# Patient Record
Sex: Female | Born: 1940
Health system: Southern US, Community
[De-identification: ages and names within clinical notes are randomized; demographics above are authoritative.]

## PROBLEM LIST (undated history)

## (undated) DIAGNOSIS — F329 Major depressive disorder, single episode, unspecified: Secondary | ICD-10-CM

## (undated) DIAGNOSIS — H269 Unspecified cataract: Secondary | ICD-10-CM

## (undated) DIAGNOSIS — F32A Depression, unspecified: Secondary | ICD-10-CM

## (undated) DIAGNOSIS — K635 Polyp of colon: Secondary | ICD-10-CM

## (undated) DIAGNOSIS — M1990 Unspecified osteoarthritis, unspecified site: Secondary | ICD-10-CM

## (undated) DIAGNOSIS — F419 Anxiety disorder, unspecified: Secondary | ICD-10-CM

## (undated) DIAGNOSIS — T7840XA Allergy, unspecified, initial encounter: Secondary | ICD-10-CM

## (undated) DIAGNOSIS — A159 Respiratory tuberculosis unspecified: Secondary | ICD-10-CM

## (undated) DIAGNOSIS — E785 Hyperlipidemia, unspecified: Secondary | ICD-10-CM

## (undated) HISTORY — PX: BREAST EXCISIONAL BIOPSY: SUR124

## (undated) HISTORY — DX: Depression, unspecified: F32.A

## (undated) HISTORY — PX: EYE SURGERY: SHX253

## (undated) HISTORY — DX: Allergy, unspecified, initial encounter: T78.40XA

## (undated) HISTORY — DX: Unspecified osteoarthritis, unspecified site: M19.90

## (undated) HISTORY — DX: Respiratory tuberculosis unspecified: A15.9

## (undated) HISTORY — PX: BUNIONECTOMY: SHX129

## (undated) HISTORY — DX: Unspecified cataract: H26.9

## (undated) HISTORY — DX: Major depressive disorder, single episode, unspecified: F32.9

## (undated) HISTORY — PX: DILATION AND CURETTAGE OF UTERUS: SHX78

## (undated) HISTORY — DX: Hyperlipidemia, unspecified: E78.5

## (undated) HISTORY — PX: OTHER SURGICAL HISTORY: SHX169

## (undated) HISTORY — DX: Anxiety disorder, unspecified: F41.9

## (undated) HISTORY — PX: HAND CONTRACTURE RELEASE: SHX1724

## (undated) HISTORY — DX: Polyp of colon: K63.5

---

## 1964-11-06 DIAGNOSIS — A159 Respiratory tuberculosis unspecified: Secondary | ICD-10-CM

## 1964-11-06 HISTORY — DX: Respiratory tuberculosis unspecified: A15.9

## 1976-11-06 HISTORY — PX: TUBAL LIGATION: SHX77

## 2000-12-10 ENCOUNTER — Other Ambulatory Visit: Admission: RE | Admit: 2000-12-10 | Discharge: 2000-12-10 | Payer: Self-pay | Admitting: Obstetrics and Gynecology

## 2001-04-25 ENCOUNTER — Encounter: Payer: Self-pay | Admitting: Obstetrics and Gynecology

## 2001-04-25 ENCOUNTER — Encounter: Admission: RE | Admit: 2001-04-25 | Discharge: 2001-04-25 | Payer: Self-pay | Admitting: Obstetrics and Gynecology

## 2001-05-01 ENCOUNTER — Encounter: Payer: Self-pay | Admitting: Obstetrics and Gynecology

## 2001-05-01 ENCOUNTER — Encounter: Admission: RE | Admit: 2001-05-01 | Discharge: 2001-05-01 | Payer: Self-pay | Admitting: Internal Medicine

## 2001-08-12 ENCOUNTER — Encounter: Admission: RE | Admit: 2001-08-12 | Discharge: 2001-08-12 | Payer: Self-pay | Admitting: Family Medicine

## 2001-08-12 ENCOUNTER — Encounter: Payer: Self-pay | Admitting: Family Medicine

## 2002-04-28 ENCOUNTER — Encounter: Payer: Self-pay | Admitting: Family Medicine

## 2002-04-28 ENCOUNTER — Encounter: Admission: RE | Admit: 2002-04-28 | Discharge: 2002-04-28 | Payer: Self-pay | Admitting: Family Medicine

## 2002-12-19 ENCOUNTER — Encounter: Payer: Self-pay | Admitting: Family Medicine

## 2002-12-19 ENCOUNTER — Encounter: Admission: RE | Admit: 2002-12-19 | Discharge: 2002-12-19 | Payer: Self-pay | Admitting: Family Medicine

## 2003-04-30 ENCOUNTER — Encounter: Payer: Self-pay | Admitting: Family Medicine

## 2003-04-30 ENCOUNTER — Encounter: Admission: RE | Admit: 2003-04-30 | Discharge: 2003-04-30 | Payer: Self-pay | Admitting: Family Medicine

## 2005-06-07 ENCOUNTER — Encounter: Admission: RE | Admit: 2005-06-07 | Discharge: 2005-06-07 | Payer: Self-pay | Admitting: Family Medicine

## 2005-09-06 ENCOUNTER — Encounter: Admission: RE | Admit: 2005-09-06 | Discharge: 2005-09-06 | Payer: Self-pay | Admitting: Internal Medicine

## 2005-12-26 ENCOUNTER — Ambulatory Visit: Payer: Self-pay | Admitting: Gastroenterology

## 2006-01-04 DIAGNOSIS — K635 Polyp of colon: Secondary | ICD-10-CM

## 2006-01-04 HISTORY — DX: Polyp of colon: K63.5

## 2006-01-11 ENCOUNTER — Ambulatory Visit: Payer: Self-pay | Admitting: Gastroenterology

## 2006-05-02 ENCOUNTER — Encounter: Admission: RE | Admit: 2006-05-02 | Discharge: 2006-05-02 | Payer: Self-pay | Admitting: Internal Medicine

## 2007-08-08 ENCOUNTER — Encounter: Admission: RE | Admit: 2007-08-08 | Discharge: 2007-08-08 | Payer: Self-pay | Admitting: Internal Medicine

## 2007-08-13 ENCOUNTER — Encounter: Admission: RE | Admit: 2007-08-13 | Discharge: 2007-08-13 | Payer: Self-pay | Admitting: Internal Medicine

## 2008-03-02 ENCOUNTER — Encounter: Admission: RE | Admit: 2008-03-02 | Discharge: 2008-03-02 | Payer: Self-pay | Admitting: Internal Medicine

## 2008-08-17 ENCOUNTER — Encounter: Admission: RE | Admit: 2008-08-17 | Discharge: 2008-08-17 | Payer: Self-pay | Admitting: Internal Medicine

## 2009-09-24 ENCOUNTER — Encounter: Admission: RE | Admit: 2009-09-24 | Discharge: 2009-09-24 | Payer: Self-pay | Admitting: Internal Medicine

## 2010-04-01 LAB — IFOBT (OCCULT BLOOD): IFOBT: NEGATIVE

## 2010-11-16 ENCOUNTER — Encounter
Admission: RE | Admit: 2010-11-16 | Discharge: 2010-11-16 | Payer: Self-pay | Source: Home / Self Care | Attending: Internal Medicine | Admitting: Internal Medicine

## 2011-01-18 ENCOUNTER — Encounter: Payer: Self-pay | Admitting: Gastroenterology

## 2011-01-24 NOTE — Letter (Signed)
Summary: Colonoscopy Letter  [facility]  [address] [place], [place] [address]   Phone: [phone]  Fax: [phone]      January 18, 2011 MRN: [id number]   Lisa Oconnell [address] [place], [place]  [address]   Dear [name],   According to your medical record, it is time for you to schedule a Colonoscopy. The American Cancer Society recommends this procedure as a method to detect early colon cancer. Patients with a family history of colon cancer, or a personal history of colon polyps or inflammatory bowel disease are at increased risk.  This letter has been generated based on the recommendations made at the time of your procedure. If you feel that in your particular situation this may no longer apply, please contact our office.  Please call our office at [phone] to schedule this appointment or to update your records at your earliest convenience.  Thank you for cooperating with [place] to provide you with the very best care possible.   Sincerely,  Judie Petit T. Russella Dar, M.D.  [facility] Gastroenterology Division [phone]

## 2011-02-17 ENCOUNTER — Emergency Department (INDEPENDENT_AMBULATORY_CARE_PROVIDER_SITE_OTHER): Payer: Medicare Other

## 2011-02-17 ENCOUNTER — Emergency Department (HOSPITAL_BASED_OUTPATIENT_CLINIC_OR_DEPARTMENT_OTHER)
Admission: EM | Admit: 2011-02-17 | Discharge: 2011-02-18 | Disposition: A | Discharge status: Home / Self Care | Payer: Medicare Other | Attending: Emergency Medicine | Admitting: Emergency Medicine

## 2011-02-17 DIAGNOSIS — R197 Diarrhea, unspecified: Secondary | ICD-10-CM

## 2011-02-17 DIAGNOSIS — R509 Fever, unspecified: Secondary | ICD-10-CM

## 2011-02-17 DIAGNOSIS — R059 Cough, unspecified: Secondary | ICD-10-CM | POA: Insufficient documentation

## 2011-02-17 DIAGNOSIS — R05 Cough: Secondary | ICD-10-CM

## 2011-02-17 DIAGNOSIS — R1110 Vomiting, unspecified: Secondary | ICD-10-CM | POA: Insufficient documentation

## 2011-02-17 DIAGNOSIS — R112 Nausea with vomiting, unspecified: Secondary | ICD-10-CM

## 2011-02-17 DIAGNOSIS — A481 Legionnaires' disease: Secondary | ICD-10-CM | POA: Insufficient documentation

## 2011-02-17 LAB — URINALYSIS, ROUTINE W REFLEX MICROSCOPIC
Bilirubin Urine: NEGATIVE
Glucose, UA: NEGATIVE mg/dL
Ketones, ur: NEGATIVE mg/dL
Specific Gravity, Urine: 1.023 (ref 1.005–1.030)
Urobilinogen, UA: 1 mg/dL (ref 0.0–1.0)
pH: 5.5 (ref 5.0–8.0)

## 2011-02-17 LAB — CBC
HCT: 33.4 % — ABNORMAL LOW (ref 36.0–46.0)
MCH: 30.6 pg (ref 26.0–34.0)
MCHC: 33.8 g/dL (ref 30.0–36.0)
MCV: 90.5 fL (ref 78.0–100.0)
RDW: 13 % (ref 11.5–15.5)
WBC: 8.2 10*3/uL (ref 4.0–10.5)

## 2011-02-17 LAB — DIFFERENTIAL
Basophils Absolute: 0 10*3/uL (ref 0.0–0.1)
Basophils Relative: 0 % (ref 0–1)
Eosinophils Relative: 0 % (ref 0–5)
Monocytes Absolute: 0.4 10*3/uL (ref 0.1–1.0)
Monocytes Relative: 5 % (ref 3–12)
Neutrophils Relative %: 90 % — ABNORMAL HIGH (ref 43–77)

## 2011-02-17 LAB — BASIC METABOLIC PANEL
BUN: 16 mg/dL (ref 6–23)
CO2: 22 mEq/L (ref 19–32)
Chloride: 108 mEq/L (ref 96–112)

## 2011-02-17 LAB — URINE MICROSCOPIC-ADD ON

## 2011-02-19 LAB — URINE CULTURE
Colony Count: NO GROWTH
Culture: NO GROWTH

## 2011-02-24 LAB — CULTURE, BLOOD (ROUTINE X 2): Culture  Setup Time: 201204140551

## 2011-11-07 HISTORY — PX: CATARACT EXTRACTION W/ INTRAOCULAR LENS  IMPLANT, BILATERAL: SHX1307

## 2011-11-14 DIAGNOSIS — H2510 Age-related nuclear cataract, unspecified eye: Secondary | ICD-10-CM | POA: Diagnosis not present

## 2011-11-21 DIAGNOSIS — H269 Unspecified cataract: Secondary | ICD-10-CM | POA: Diagnosis not present

## 2011-11-21 DIAGNOSIS — H2510 Age-related nuclear cataract, unspecified eye: Secondary | ICD-10-CM | POA: Diagnosis not present

## 2011-11-21 DIAGNOSIS — H538 Other visual disturbances: Secondary | ICD-10-CM | POA: Diagnosis not present

## 2012-03-18 ENCOUNTER — Encounter: Payer: Self-pay | Admitting: Gastroenterology

## 2012-04-03 DIAGNOSIS — M949 Disorder of cartilage, unspecified: Secondary | ICD-10-CM | POA: Diagnosis not present

## 2012-04-03 DIAGNOSIS — R82998 Other abnormal findings in urine: Secondary | ICD-10-CM | POA: Diagnosis not present

## 2012-04-03 DIAGNOSIS — M899 Disorder of bone, unspecified: Secondary | ICD-10-CM | POA: Diagnosis not present

## 2012-04-03 DIAGNOSIS — E785 Hyperlipidemia, unspecified: Secondary | ICD-10-CM | POA: Diagnosis not present

## 2012-04-12 ENCOUNTER — Ambulatory Visit (AMBULATORY_SURGERY_CENTER): Payer: Medicare Other | Admitting: *Deleted

## 2012-04-12 VITALS — Ht 62.5 in | Wt 138.0 lb

## 2012-04-12 DIAGNOSIS — Z0000 Encounter for general adult medical examination without abnormal findings: Secondary | ICD-10-CM | POA: Diagnosis not present

## 2012-04-12 DIAGNOSIS — F341 Dysthymic disorder: Secondary | ICD-10-CM | POA: Diagnosis not present

## 2012-04-12 DIAGNOSIS — Z124 Encounter for screening for malignant neoplasm of cervix: Secondary | ICD-10-CM | POA: Diagnosis not present

## 2012-04-12 DIAGNOSIS — M25519 Pain in unspecified shoulder: Secondary | ICD-10-CM | POA: Diagnosis not present

## 2012-04-12 DIAGNOSIS — E785 Hyperlipidemia, unspecified: Secondary | ICD-10-CM | POA: Diagnosis not present

## 2012-04-12 DIAGNOSIS — Z0120 Encounter for dental examination and cleaning without abnormal findings: Secondary | ICD-10-CM | POA: Diagnosis not present

## 2012-04-12 DIAGNOSIS — Z1211 Encounter for screening for malignant neoplasm of colon: Secondary | ICD-10-CM

## 2012-04-12 MED ORDER — PEG-KCL-NACL-NASULF-NA ASC-C 100 G PO SOLR: ORAL | Status: DC

## 2012-04-26 ENCOUNTER — Ambulatory Visit (AMBULATORY_SURGERY_CENTER): Payer: Medicare Other | Admitting: Gastroenterology

## 2012-04-26 ENCOUNTER — Encounter: Payer: Self-pay | Admitting: Gastroenterology

## 2012-04-26 VITALS — BP 131/80 | HR 61 | Temp 97.0°F | Resp 12 | Ht 62.0 in | Wt 138.0 lb

## 2012-04-26 DIAGNOSIS — Z86010 Personal history of colonic polyps: Secondary | ICD-10-CM

## 2012-04-26 DIAGNOSIS — D126 Benign neoplasm of colon, unspecified: Secondary | ICD-10-CM

## 2012-04-26 DIAGNOSIS — Z1211 Encounter for screening for malignant neoplasm of colon: Secondary | ICD-10-CM | POA: Diagnosis not present

## 2012-04-26 DIAGNOSIS — F411 Generalized anxiety disorder: Secondary | ICD-10-CM | POA: Diagnosis not present

## 2012-04-26 MED ORDER — SODIUM CHLORIDE 0.9 % IV SOLN: 500.0000 mL | INTRAVENOUS | Status: DC

## 2012-04-26 NOTE — Op Note (Signed)
[facility] [address] [place], [place]  [address]  COLONOSCOPY PROCEDURE REPORT PATIENT:  Lisa Oconnell, Lisa Oconnell  MR#:  [id number] BIRTHDATE:  [birth year], 70 yrs. old  GENDER:  female ENDOSCOPIST:  Judie Petit T. Russella Dar, MD, [facility]  PROCEDURE DATE:  04/26/2012 PROCEDURE:  Colonoscopy with snare polypectomy ASA CLASS:  Class II INDICATIONS:  1) surveillance and high-risk screening  2) history of pre-cancerous (adenomatous) colon polyps: 01/2006 MEDICATIONS:   MAC sedation, administered by CRNA, propofol (Diprivan) 200 mg IV DESCRIPTION OF PROCEDURE:   After the risks benefits and alternatives of the procedure were thoroughly explained, informed consent was obtained.  Digital rectal exam was performed and revealed no abnormalities.   The LB [id number]-[id number] endoscope was introduced through the anus and advanced to the cecum, which was identified by both the appendix and ileocecal valve, without limitations.  The quality of the prep was excellent, using MoviPrep.  The instrument was then slowly withdrawn as the colon was fully examined. <<PROCEDUREIMAGES>> FINDINGS:  A sessile polyp was found in the cecum. It was 7 mm in size. Polyp was snared without cautery. Retrieval was successful. Two polyps were found in the ascending colon. They were 5 mm in size. Polyps were snared without cautery. Retrieval was successful. Otherwise normal colonoscopy without other polyps, masses, vascular ectasias, or inflammatory changes. Retroflexed views in the rectum revealed internal hemorrhoids, small.  The time to cecum =  5.25  minutes. The scope was then withdrawn (time =  11.25  min) from the patient and the procedure completed.  COMPLICATIONS:  None  ENDOSCOPIC IMPRESSION: 1) 7 mm sessile polyp in the cecum 2) 5 mm Two polyps in the ascending colon 3) Internal hemorrhoids  RECOMMENDATIONS: 1) Await pathology results 2) Repeat Colonoscopy in 5 years.  Venita Lick. Russella Dar, MD, [facility]  CC:  Kari Baars, MD  n. Rosalie DoctorVenita Lick. Heike Pounds at 04/26/2012 12:01 PM  Sherol Dade, [phone]

## 2012-04-26 NOTE — Patient Instructions (Addendum)
Handouts were given on polyps, hemorrhoids and high fiber diet with liberal fluids.  You may resume your prior medications today.  Please call if any questions or concerns.    YOU HAD AN ENDOSCOPIC PROCEDURE TODAY AT THE [facility]: Refer to the procedure report that was given to you for any specific questions about what was found during the examination.  If the procedure report does not answer your questions, please call your gastroenterologist to clarify.  If you requested that your care partner not be given the details of your procedure findings, then the procedure report has been included in a sealed envelope for you to review at your convenience later.  YOU SHOULD EXPECT: Some feelings of bloating in the abdomen. Passage of more gas than usual.  Walking can help get rid of the air that was put into your GI tract during the procedure and reduce the bloating. If you had a lower endoscopy (such as a colonoscopy or flexible sigmoidoscopy) you may notice spotting of blood in your stool or on the toilet paper. If you underwent a bowel prep for your procedure, then you may not have a normal bowel movement for a few days.  DIET: Your first meal following the procedure should be a light meal and then it is ok to progress to your normal diet.  A half-sandwich or bowl of soup is an example of a good first meal.  Heavy or fried foods are harder to digest and may make you feel nauseous or bloated.  Likewise meals heavy in dairy and vegetables can cause extra gas to form and this can also increase the bloating.  Drink plenty of fluids but you should avoid alcoholic beverages for 24 hours.  ACTIVITY: Your care partner should take you home directly after the procedure.  You should plan to take it easy, moving slowly for the rest of the day.  You can resume normal activity the day after the procedure however you should NOT DRIVE or use heavy machinery for 24 hours (because of the sedation medicines used  during the test).    SYMPTOMS TO REPORT IMMEDIATELY: A gastroenterologist can be reached at any hour.  During normal business hours, 8:30 AM to 5:00 PM Monday through Friday, call [phone].  After hours and on weekends, please call the GI answering service at [phone] who will take a message and have the physician on call contact you.   Following lower endoscopy (colonoscopy or flexible sigmoidoscopy):  Excessive amounts of blood in the stool  Significant tenderness or worsening of abdominal pains  Swelling of the abdomen that is new, acute  Fever of 100F or higher    FOLLOW UP: If any biopsies were taken you will be contacted by phone or by letter within the next 1-3 weeks.  Call your gastroenterologist if you have not heard about the biopsies in 3 weeks.  Our staff will call the home number listed on your records the next business day following your procedure to check on you and address any questions or concerns that you may have at that time regarding the information given to you following your procedure. This is a courtesy call and so if there is no answer at the home number and we have not heard from you through the emergency physician on call, we will assume that you have returned to your regular daily activities without incident.  SIGNATURES/CONFIDENTIALITY: You and/or your care partner have signed paperwork which will be entered into your  electronic medical record.  These signatures attest to the fact that that the information above on your After Visit Summary has been reviewed and is understood.  Full responsibility of the confidentiality of this discharge information lies with you and/or your care-partner.

## 2012-04-26 NOTE — Progress Notes (Signed)
No complaints noted in the recovery room. [place]  Patient did not experience any of the following events: a burn prior to discharge; a fall within the facility; wrong site/side/patient/procedure/implant event; or a hospital transfer or hospital admission upon discharge from the facility. (G8907) Patient did not have preoperative order for IV antibiotic SSI prophylaxis. (G8918)  

## 2012-04-29 ENCOUNTER — Telehealth: Payer: Self-pay | Admitting: *Deleted

## 2012-04-29 NOTE — Telephone Encounter (Signed)
  Follow up Call-  Call back number 04/26/2012  Post procedure Call Back phone  # [phone]  Permission to leave phone message Yes     Patient questions:  Do you have a fever, pain , or abdominal swelling? no Pain Score  0 *  Have you tolerated food without any problems? yes  Have you been able to return to your normal activities? yes  Do you have any questions about your discharge instructions: Diet   no Medications  no Follow up visit  no  Do you have questions or concerns about your Care? no  Actions: * If pain score is 4 or above: No action needed, pain <4.

## 2012-04-30 ENCOUNTER — Encounter: Payer: Self-pay | Admitting: Gastroenterology

## 2012-06-17 ENCOUNTER — Other Ambulatory Visit: Payer: Self-pay | Admitting: Internal Medicine

## 2012-06-17 DIAGNOSIS — Z1231 Encounter for screening mammogram for malignant neoplasm of breast: Secondary | ICD-10-CM

## 2012-07-01 ENCOUNTER — Ambulatory Visit
Admission: RE | Admit: 2012-07-01 | Discharge: 2012-07-01 | Disposition: A | Discharge status: Home / Self Care | Payer: Medicare Other | Source: Ambulatory Visit | Attending: Internal Medicine | Admitting: Internal Medicine

## 2012-07-01 DIAGNOSIS — Z1231 Encounter for screening mammogram for malignant neoplasm of breast: Secondary | ICD-10-CM

## 2012-07-02 ENCOUNTER — Ambulatory Visit: Payer: Medicare Other

## 2012-11-07 DIAGNOSIS — Z1331 Encounter for screening for depression: Secondary | ICD-10-CM | POA: Diagnosis not present

## 2012-11-07 DIAGNOSIS — M25569 Pain in unspecified knee: Secondary | ICD-10-CM | POA: Diagnosis not present

## 2012-11-07 DIAGNOSIS — M7660 Achilles tendinitis, unspecified leg: Secondary | ICD-10-CM | POA: Diagnosis not present

## 2012-11-13 DIAGNOSIS — H18059 Posterior corneal pigmentations, unspecified eye: Secondary | ICD-10-CM | POA: Diagnosis not present

## 2012-11-13 DIAGNOSIS — Z961 Presence of intraocular lens: Secondary | ICD-10-CM | POA: Diagnosis not present

## 2012-11-13 DIAGNOSIS — H524 Presbyopia: Secondary | ICD-10-CM | POA: Diagnosis not present

## 2012-11-13 DIAGNOSIS — H04129 Dry eye syndrome of unspecified lacrimal gland: Secondary | ICD-10-CM | POA: Diagnosis not present

## 2013-02-20 DIAGNOSIS — D239 Other benign neoplasm of skin, unspecified: Secondary | ICD-10-CM | POA: Diagnosis not present

## 2013-02-20 DIAGNOSIS — Z9189 Other specified personal risk factors, not elsewhere classified: Secondary | ICD-10-CM | POA: Diagnosis not present

## 2013-02-20 DIAGNOSIS — L821 Other seborrheic keratosis: Secondary | ICD-10-CM | POA: Diagnosis not present

## 2013-04-10 DIAGNOSIS — E785 Hyperlipidemia, unspecified: Secondary | ICD-10-CM | POA: Diagnosis not present

## 2013-04-10 DIAGNOSIS — M899 Disorder of bone, unspecified: Secondary | ICD-10-CM | POA: Diagnosis not present

## 2013-04-10 DIAGNOSIS — M949 Disorder of cartilage, unspecified: Secondary | ICD-10-CM | POA: Diagnosis not present

## 2013-04-10 DIAGNOSIS — R82998 Other abnormal findings in urine: Secondary | ICD-10-CM | POA: Diagnosis not present

## 2013-04-22 DIAGNOSIS — G4700 Insomnia, unspecified: Secondary | ICD-10-CM | POA: Diagnosis not present

## 2013-04-22 DIAGNOSIS — R17 Unspecified jaundice: Secondary | ICD-10-CM | POA: Diagnosis not present

## 2013-04-22 DIAGNOSIS — M549 Dorsalgia, unspecified: Secondary | ICD-10-CM | POA: Diagnosis not present

## 2013-04-22 DIAGNOSIS — F341 Dysthymic disorder: Secondary | ICD-10-CM | POA: Diagnosis not present

## 2013-04-22 DIAGNOSIS — Z0000 Encounter for general adult medical examination without abnormal findings: Secondary | ICD-10-CM | POA: Diagnosis not present

## 2013-04-22 DIAGNOSIS — M899 Disorder of bone, unspecified: Secondary | ICD-10-CM | POA: Diagnosis not present

## 2013-04-22 DIAGNOSIS — E785 Hyperlipidemia, unspecified: Secondary | ICD-10-CM | POA: Diagnosis not present

## 2013-04-22 DIAGNOSIS — M25559 Pain in unspecified hip: Secondary | ICD-10-CM | POA: Diagnosis not present

## 2013-04-23 DIAGNOSIS — M899 Disorder of bone, unspecified: Secondary | ICD-10-CM | POA: Diagnosis not present

## 2013-04-23 DIAGNOSIS — M949 Disorder of cartilage, unspecified: Secondary | ICD-10-CM | POA: Diagnosis not present

## 2013-04-30 DIAGNOSIS — Z1212 Encounter for screening for malignant neoplasm of rectum: Secondary | ICD-10-CM | POA: Diagnosis not present

## 2013-04-30 LAB — IFOBT (OCCULT BLOOD): IFOBT: NEGATIVE

## 2014-02-04 DIAGNOSIS — H524 Presbyopia: Secondary | ICD-10-CM | POA: Diagnosis not present

## 2014-02-04 DIAGNOSIS — H04129 Dry eye syndrome of unspecified lacrimal gland: Secondary | ICD-10-CM | POA: Diagnosis not present

## 2014-02-04 DIAGNOSIS — Z961 Presence of intraocular lens: Secondary | ICD-10-CM | POA: Diagnosis not present

## 2014-03-04 DIAGNOSIS — Z9189 Other specified personal risk factors, not elsewhere classified: Secondary | ICD-10-CM | POA: Diagnosis not present

## 2014-03-04 DIAGNOSIS — L570 Actinic keratosis: Secondary | ICD-10-CM | POA: Diagnosis not present

## 2014-03-04 DIAGNOSIS — L821 Other seborrheic keratosis: Secondary | ICD-10-CM | POA: Diagnosis not present

## 2014-03-04 DIAGNOSIS — D239 Other benign neoplasm of skin, unspecified: Secondary | ICD-10-CM | POA: Diagnosis not present

## 2014-04-21 DIAGNOSIS — M949 Disorder of cartilage, unspecified: Secondary | ICD-10-CM | POA: Diagnosis not present

## 2014-04-21 DIAGNOSIS — R82998 Other abnormal findings in urine: Secondary | ICD-10-CM | POA: Diagnosis not present

## 2014-04-21 DIAGNOSIS — M899 Disorder of bone, unspecified: Secondary | ICD-10-CM | POA: Diagnosis not present

## 2014-04-21 DIAGNOSIS — E785 Hyperlipidemia, unspecified: Secondary | ICD-10-CM | POA: Diagnosis not present

## 2014-04-27 DIAGNOSIS — Z0000 Encounter for general adult medical examination without abnormal findings: Secondary | ICD-10-CM | POA: Diagnosis not present

## 2014-04-27 DIAGNOSIS — M899 Disorder of bone, unspecified: Secondary | ICD-10-CM | POA: Diagnosis not present

## 2014-04-27 DIAGNOSIS — E785 Hyperlipidemia, unspecified: Secondary | ICD-10-CM | POA: Diagnosis not present

## 2014-04-27 DIAGNOSIS — G4700 Insomnia, unspecified: Secondary | ICD-10-CM | POA: Diagnosis not present

## 2014-04-27 DIAGNOSIS — M25559 Pain in unspecified hip: Secondary | ICD-10-CM | POA: Diagnosis not present

## 2014-04-27 DIAGNOSIS — Z1331 Encounter for screening for depression: Secondary | ICD-10-CM | POA: Diagnosis not present

## 2014-04-27 DIAGNOSIS — M949 Disorder of cartilage, unspecified: Secondary | ICD-10-CM | POA: Diagnosis not present

## 2014-04-27 DIAGNOSIS — F341 Dysthymic disorder: Secondary | ICD-10-CM | POA: Diagnosis not present

## 2014-04-27 DIAGNOSIS — Z23 Encounter for immunization: Secondary | ICD-10-CM | POA: Diagnosis not present

## 2014-04-27 DIAGNOSIS — Z124 Encounter for screening for malignant neoplasm of cervix: Secondary | ICD-10-CM | POA: Diagnosis not present

## 2014-04-28 ENCOUNTER — Other Ambulatory Visit: Payer: Self-pay | Admitting: Internal Medicine

## 2014-04-28 DIAGNOSIS — Z1231 Encounter for screening mammogram for malignant neoplasm of breast: Secondary | ICD-10-CM

## 2014-05-11 ENCOUNTER — Ambulatory Visit: Payer: Medicare Other

## 2014-05-27 ENCOUNTER — Ambulatory Visit
Admission: RE | Admit: 2014-05-27 | Discharge: 2014-05-27 | Disposition: A | Discharge status: Home / Self Care | Payer: Medicare Other | Source: Ambulatory Visit | Attending: Internal Medicine | Admitting: Internal Medicine

## 2014-05-27 DIAGNOSIS — Z1231 Encounter for screening mammogram for malignant neoplasm of breast: Secondary | ICD-10-CM | POA: Diagnosis not present

## 2014-07-23 ENCOUNTER — Encounter: Payer: Self-pay | Admitting: Gastroenterology

## 2014-10-15 DIAGNOSIS — L309 Dermatitis, unspecified: Secondary | ICD-10-CM | POA: Diagnosis not present

## 2014-10-15 DIAGNOSIS — L821 Other seborrheic keratosis: Secondary | ICD-10-CM | POA: Diagnosis not present

## 2014-10-15 DIAGNOSIS — D489 Neoplasm of uncertain behavior, unspecified: Secondary | ICD-10-CM | POA: Diagnosis not present

## 2014-11-19 DIAGNOSIS — L570 Actinic keratosis: Secondary | ICD-10-CM | POA: Diagnosis not present

## 2014-11-19 DIAGNOSIS — L309 Dermatitis, unspecified: Secondary | ICD-10-CM | POA: Diagnosis not present

## 2015-03-15 DIAGNOSIS — L821 Other seborrheic keratosis: Secondary | ICD-10-CM | POA: Diagnosis not present

## 2015-03-15 DIAGNOSIS — D225 Melanocytic nevi of trunk: Secondary | ICD-10-CM | POA: Diagnosis not present

## 2015-03-15 DIAGNOSIS — D485 Neoplasm of uncertain behavior of skin: Secondary | ICD-10-CM | POA: Diagnosis not present

## 2015-03-15 DIAGNOSIS — L570 Actinic keratosis: Secondary | ICD-10-CM | POA: Diagnosis not present

## 2015-03-30 DIAGNOSIS — M859 Disorder of bone density and structure, unspecified: Secondary | ICD-10-CM | POA: Diagnosis not present

## 2015-04-23 DIAGNOSIS — M859 Disorder of bone density and structure, unspecified: Secondary | ICD-10-CM | POA: Diagnosis not present

## 2015-04-23 DIAGNOSIS — E785 Hyperlipidemia, unspecified: Secondary | ICD-10-CM | POA: Diagnosis not present

## 2015-04-23 DIAGNOSIS — N390 Urinary tract infection, site not specified: Secondary | ICD-10-CM | POA: Diagnosis not present

## 2015-04-23 DIAGNOSIS — Z0000 Encounter for general adult medical examination without abnormal findings: Secondary | ICD-10-CM | POA: Diagnosis not present

## 2015-04-30 ENCOUNTER — Other Ambulatory Visit: Payer: Self-pay | Admitting: Internal Medicine

## 2015-04-30 DIAGNOSIS — E785 Hyperlipidemia, unspecified: Secondary | ICD-10-CM

## 2015-04-30 DIAGNOSIS — F418 Other specified anxiety disorders: Secondary | ICD-10-CM | POA: Diagnosis not present

## 2015-04-30 DIAGNOSIS — Z6824 Body mass index (BMI) 24.0-24.9, adult: Secondary | ICD-10-CM | POA: Diagnosis not present

## 2015-04-30 DIAGNOSIS — Z1212 Encounter for screening for malignant neoplasm of rectum: Secondary | ICD-10-CM | POA: Diagnosis not present

## 2015-04-30 DIAGNOSIS — K5909 Other constipation: Secondary | ICD-10-CM | POA: Diagnosis not present

## 2015-04-30 DIAGNOSIS — E784 Other hyperlipidemia: Secondary | ICD-10-CM | POA: Diagnosis not present

## 2015-04-30 DIAGNOSIS — D126 Benign neoplasm of colon, unspecified: Secondary | ICD-10-CM | POA: Diagnosis not present

## 2015-04-30 DIAGNOSIS — Z0000 Encounter for general adult medical examination without abnormal findings: Secondary | ICD-10-CM | POA: Diagnosis not present

## 2015-04-30 DIAGNOSIS — M859 Disorder of bone density and structure, unspecified: Secondary | ICD-10-CM | POA: Diagnosis not present

## 2015-04-30 DIAGNOSIS — M545 Low back pain: Secondary | ICD-10-CM | POA: Diagnosis not present

## 2015-04-30 DIAGNOSIS — Z1389 Encounter for screening for other disorder: Secondary | ICD-10-CM | POA: Diagnosis not present

## 2015-04-30 DIAGNOSIS — K219 Gastro-esophageal reflux disease without esophagitis: Secondary | ICD-10-CM | POA: Diagnosis not present

## 2015-04-30 LAB — IFOBT (OCCULT BLOOD): IFOBT: NEGATIVE

## 2015-05-06 ENCOUNTER — Ambulatory Visit
Admission: RE | Admit: 2015-05-06 | Discharge: 2015-05-06 | Disposition: A | Discharge status: Home / Self Care | Payer: No Typology Code available for payment source | Source: Ambulatory Visit | Attending: Internal Medicine | Admitting: Internal Medicine

## 2015-05-06 DIAGNOSIS — E785 Hyperlipidemia, unspecified: Secondary | ICD-10-CM

## 2015-07-19 ENCOUNTER — Other Ambulatory Visit: Payer: Self-pay

## 2015-07-19 DIAGNOSIS — Z1231 Encounter for screening mammogram for malignant neoplasm of breast: Secondary | ICD-10-CM

## 2015-07-20 ENCOUNTER — Ambulatory Visit: Payer: No Typology Code available for payment source

## 2015-07-29 ENCOUNTER — Ambulatory Visit
Admission: RE | Admit: 2015-07-29 | Discharge: 2015-07-29 | Disposition: A | Discharge status: Home / Self Care | Payer: Medicare Other | Source: Ambulatory Visit

## 2015-07-29 DIAGNOSIS — Z1231 Encounter for screening mammogram for malignant neoplasm of breast: Secondary | ICD-10-CM

## 2015-09-17 DIAGNOSIS — M1711 Unilateral primary osteoarthritis, right knee: Secondary | ICD-10-CM | POA: Diagnosis not present

## 2015-12-13 DIAGNOSIS — M1711 Unilateral primary osteoarthritis, right knee: Secondary | ICD-10-CM | POA: Diagnosis not present

## 2015-12-13 DIAGNOSIS — S83231A Complex tear of medial meniscus, current injury, right knee, initial encounter: Secondary | ICD-10-CM | POA: Diagnosis not present

## 2015-12-13 DIAGNOSIS — M25561 Pain in right knee: Secondary | ICD-10-CM | POA: Diagnosis not present

## 2016-03-14 DIAGNOSIS — M1711 Unilateral primary osteoarthritis, right knee: Secondary | ICD-10-CM | POA: Diagnosis not present

## 2016-03-20 DIAGNOSIS — D485 Neoplasm of uncertain behavior of skin: Secondary | ICD-10-CM | POA: Diagnosis not present

## 2016-03-20 DIAGNOSIS — Z86018 Personal history of other benign neoplasm: Secondary | ICD-10-CM | POA: Diagnosis not present

## 2016-03-20 DIAGNOSIS — L570 Actinic keratosis: Secondary | ICD-10-CM | POA: Diagnosis not present

## 2016-03-20 DIAGNOSIS — D225 Melanocytic nevi of trunk: Secondary | ICD-10-CM | POA: Diagnosis not present

## 2016-03-20 DIAGNOSIS — L821 Other seborrheic keratosis: Secondary | ICD-10-CM | POA: Diagnosis not present

## 2016-03-27 DIAGNOSIS — M1711 Unilateral primary osteoarthritis, right knee: Secondary | ICD-10-CM | POA: Diagnosis not present

## 2016-03-31 DIAGNOSIS — M1711 Unilateral primary osteoarthritis, right knee: Secondary | ICD-10-CM | POA: Diagnosis not present

## 2016-03-31 DIAGNOSIS — M23321 Other meniscus derangements, posterior horn of medial meniscus, right knee: Secondary | ICD-10-CM | POA: Diagnosis not present

## 2016-04-07 ENCOUNTER — Ambulatory Visit: Payer: Self-pay | Admitting: Orthopedic Surgery

## 2016-04-07 NOTE — Patient Instructions (Addendum)
CANDRA ALTHEIDE  04/07/2016   Your procedure is scheduled on: 04/11/16  Report to [facility] Main  Entrance take [facility]  elevators to 3rd floor to  [place] at 1:05 PM.  Call this number if you have problems the morning of surgery [phone]   Remember: ONLY 1 PERSON MAY GO WITH YOU TO SHORT STAY TO GET  READY MORNING OF [place].  Do not eat food or drink liquids :After Midnight.  You may have clear liquids until 9:05 AM the morning of surgery.   Take these medicines the morning of surgery with A SIP OF WATER: Prozac DO NOT TAKE ANY DIABETIC MEDICATIONS DAY OF YOUR SURGERY                               You may not have any metal on your body including hair pins and              piercings  Do not wear jewelry, make-up, lotions, powders or perfumes, deodorant             Do not wear nail polish.  Do not shave  48 hours prior to surgery.              Men may shave face and neck.   Do not bring valuables to the hospital. [place].  Contacts, dentures or bridgework may not be worn into surgery.  Leave suitcase in the car. After surgery it may be brought to your room.     Patients discharged the day of surgery will not be allowed to drive home.  Name and phone number of your driver:  Special Instructions:coughing and deep breathing exercises, leg exercises              Please read over the following fact sheets you were given: _____________________________________________________________________             [facility] - Preparing for Surgery Before surgery, you can play an important role.  Because skin is not sterile, your skin needs to be as free of germs as possible.  You can reduce the number of germs on your skin by washing with CHG (chlorahexidine gluconate) soap before surgery.  CHG is an antiseptic cleaner which kills germs and bonds with the skin to continue killing germs even after  washing. Please DO NOT use if you have an allergy to CHG or antibacterial soaps.  If your skin becomes reddened/irritated stop using the CHG and inform your nurse when you arrive at Short Stay. Do not shave (including legs and underarms) for at least 48 hours prior to the first CHG shower.  You may shave your face/neck. Please follow these instructions carefully:  1.  Shower with CHG Soap the night before surgery and the  morning of Surgery.  2.  If you choose to wash your hair, wash your hair first as usual with your  normal  shampoo.  3.  After you shampoo, rinse your hair and body thoroughly to remove the  shampoo.                           4.  Use CHG as you would any other  liquid soap.  You can apply chg directly  to the skin and wash                       Gently with a scrungie or clean washcloth.  5.  Apply the CHG Soap to your body ONLY FROM THE NECK DOWN.   Do not use on face/ open                           Wound or open sores. Avoid contact with eyes, ears mouth and genitals (private parts).                       Wash face,  Genitals (private parts) with your normal soap.             6.  Wash thoroughly, paying special attention to the area where your surgery  will be performed.  7.  Thoroughly rinse your body with warm water from the neck down.  8.  DO NOT shower/wash with your normal soap after using and rinsing off  the CHG Soap.                9.  Pat yourself dry with a clean towel.            10.  Wear clean pajamas.            11.  Place clean sheets on your bed the night of your first shower and do not  sleep with pets. Day of Surgery : Do not apply any lotions/deodorants the morning of surgery.  Please wear clean clothes to the hospital/surgery center.  FAILURE TO FOLLOW THESE INSTRUCTIONS MAY RESULT IN THE CANCELLATION OF YOUR SURGERY PATIENT SIGNATURE_________________________________  NURSE  SIGNATURE__________________________________  ________________________________________________________________________   Adam Phenix  An incentive spirometer is a tool that can help keep your lungs clear and active. This tool measures how well you are filling your lungs with each breath. Taking long deep breaths may help reverse or decrease the chance of developing breathing (pulmonary) problems (especially infection) following:  A long period of time when you are unable to move or be active. BEFORE THE PROCEDURE   If the spirometer includes an indicator to show your best effort, your nurse or respiratory therapist will set it to a desired goal.  If possible, sit up straight or lean slightly forward. Try not to slouch.  Hold the incentive spirometer in an upright position. INSTRUCTIONS FOR USE  1. Sit on the edge of your bed if possible, or sit up as far as you can in bed or on a chair. 2. Hold the incentive spirometer in an upright position. 3. Breathe out normally. 4. Place the mouthpiece in your mouth and seal your lips tightly around it. 5. Breathe in slowly and as deeply as possible, raising the piston or the ball toward the top of the column. 6. Hold your breath for 3-5 seconds or for as long as possible. Allow the piston or ball to fall to the bottom of the column. 7. Remove the mouthpiece from your mouth and breathe out normally. 8. Rest for a few seconds and repeat Steps 1 through 7 at least 10 times every 1-2 hours when you are awake. Take your time and take a few normal breaths between deep breaths. 9. The spirometer may include an indicator to show your best effort. Use the indicator as a goal  to work toward during each repetition. 10. After each set of 10 deep breaths, practice coughing to be sure your lungs are clear. If you have an incision (the cut made at the time of surgery), support your incision when coughing by placing a pillow or rolled up towels firmly  against it. Once you are able to get out of bed, walk around indoors and cough well. You may stop using the incentive spirometer when instructed by your caregiver.  RISKS AND COMPLICATIONS  Take your time so you do not get dizzy or light-headed.  If you are in pain, you may need to take or ask for pain medication before doing incentive spirometry. It is harder to take a deep breath if you are having pain. AFTER USE  Rest and breathe slowly and easily.  It can be helpful to keep track of a log of your progress. Your caregiver can provide you with a simple table to help with this. If you are using the spirometer at home, follow these instructions: [place] IF:   You are having difficultly using the spirometer.  You have trouble using the spirometer as often as instructed.  Your pain medication is not giving enough relief while using the spirometer.  You develop fever of 100.5 F (38.1 C) or higher. SEEK IMMEDIATE MEDICAL CARE IF:   You cough up bloody sputum that had not been present before.  You develop fever of 102 F (38.9 C) or greater.  You develop worsening pain at or near the incision site. MAKE SURE YOU:   Understand these instructions.  Will watch your condition.  Will get help right away if you are not doing well or get worse. Document Released: 03/05/2007 Document Revised: 01/15/2012 Document Reviewed: 05/06/2007 ExitCare Patient Information 2014 ExitCare, [place].   ________________________________________________________________________    CLEAR LIQUID DIET   Foods Allowed                                                                     Foods Excluded  Coffee and tea, regular and decaf                             liquids that you cannot  Plain Jell-O in any flavor                                             see through such as: Fruit ices (not with fruit pulp)                                     milk, soups, orange juice  Iced Popsicles                                     All solid food Carbonated beverages, regular and diet  Cranberry, grape and apple juices Sports drinks like Gatorade Lightly seasoned clear broth or consume(fat free) Sugar, honey syrup  Sample Menu Breakfast                                Lunch                                     Supper Cranberry juice                    Beef broth                            Chicken broth Jell-O                                     Grape juice                           Apple juice Coffee or tea                        Jell-O                                      Popsicle                                                Coffee or tea                        Coffee or tea  _____________________________________________________________________

## 2016-04-07 NOTE — Progress Notes (Signed)
Preoperative surgical orders have been place into the Epic hospital system for Lisa Oconnell on 04/07/2016, 9:19 AM  by Mickel Crow for surgery on 04-11-16.  Preop Knee Scope orders including IV Tylenol and IV Decadron as long as there are no contraindications to the above medications. Arlee Muslim, PA-C

## 2016-04-10 ENCOUNTER — Encounter (HOSPITAL_COMMUNITY): Payer: Self-pay

## 2016-04-10 ENCOUNTER — Encounter (HOSPITAL_COMMUNITY)
Admission: RE | Admit: 2016-04-10 | Discharge: 2016-04-10 | Disposition: A | Discharge status: Home / Self Care | Payer: Medicare Other | Source: Ambulatory Visit | Attending: Orthopedic Surgery | Admitting: Orthopedic Surgery

## 2016-04-10 DIAGNOSIS — F419 Anxiety disorder, unspecified: Secondary | ICD-10-CM | POA: Diagnosis not present

## 2016-04-10 DIAGNOSIS — X58XXXA Exposure to other specified factors, initial encounter: Secondary | ICD-10-CM | POA: Diagnosis not present

## 2016-04-10 DIAGNOSIS — M1990 Unspecified osteoarthritis, unspecified site: Secondary | ICD-10-CM | POA: Diagnosis not present

## 2016-04-10 DIAGNOSIS — M25561 Pain in right knee: Secondary | ICD-10-CM | POA: Diagnosis present

## 2016-04-10 DIAGNOSIS — S83241A Other tear of medial meniscus, current injury, right knee, initial encounter: Secondary | ICD-10-CM | POA: Diagnosis not present

## 2016-04-10 DIAGNOSIS — Z79899 Other long term (current) drug therapy: Secondary | ICD-10-CM | POA: Diagnosis not present

## 2016-04-10 DIAGNOSIS — M2241 Chondromalacia patellae, right knee: Secondary | ICD-10-CM | POA: Diagnosis not present

## 2016-04-10 LAB — CBC
HEMATOCRIT: 39.3 % (ref 36.0–46.0)
HEMOGLOBIN: 13.8 g/dL (ref 12.0–15.0)
MCH: 31.7 pg (ref 26.0–34.0)
MCHC: 35.1 g/dL (ref 30.0–36.0)
MCV: 90.1 fL (ref 78.0–100.0)
Platelets: 306 10*3/uL (ref 150–400)
RBC: 4.36 MIL/uL (ref 3.87–5.11)
RDW: 13 % (ref 11.5–15.5)
WBC: 5.8 10*3/uL (ref 4.0–10.5)

## 2016-04-10 LAB — SURGICAL PCR SCREEN
MRSA, PCR: NEGATIVE
STAPHYLOCOCCUS AUREUS: NEGATIVE

## 2016-04-11 ENCOUNTER — Ambulatory Visit (HOSPITAL_COMMUNITY): Payer: Medicare Other | Admitting: Anesthesiology

## 2016-04-11 ENCOUNTER — Ambulatory Visit (HOSPITAL_COMMUNITY)
Admission: RE | Admit: 2016-04-11 | Discharge: 2016-04-11 | Disposition: A | Discharge status: Home / Self Care | Payer: Medicare Other | Source: Ambulatory Visit | Attending: Orthopedic Surgery | Admitting: Orthopedic Surgery

## 2016-04-11 ENCOUNTER — Encounter (HOSPITAL_COMMUNITY)
Admission: RE | Disposition: A | Discharge status: Home / Self Care | Payer: Self-pay | Source: Ambulatory Visit | Attending: Orthopedic Surgery

## 2016-04-11 DIAGNOSIS — M2241 Chondromalacia patellae, right knee: Secondary | ICD-10-CM | POA: Diagnosis not present

## 2016-04-11 DIAGNOSIS — X58XXXA Exposure to other specified factors, initial encounter: Secondary | ICD-10-CM | POA: Insufficient documentation

## 2016-04-11 DIAGNOSIS — M1990 Unspecified osteoarthritis, unspecified site: Secondary | ICD-10-CM | POA: Insufficient documentation

## 2016-04-11 DIAGNOSIS — Z79899 Other long term (current) drug therapy: Secondary | ICD-10-CM | POA: Insufficient documentation

## 2016-04-11 DIAGNOSIS — S83241A Other tear of medial meniscus, current injury, right knee, initial encounter: Secondary | ICD-10-CM | POA: Insufficient documentation

## 2016-04-11 DIAGNOSIS — S83249A Other tear of medial meniscus, current injury, unspecified knee, initial encounter: Secondary | ICD-10-CM | POA: Diagnosis present

## 2016-04-11 DIAGNOSIS — M23321 Other meniscus derangements, posterior horn of medial meniscus, right knee: Secondary | ICD-10-CM | POA: Diagnosis not present

## 2016-04-11 DIAGNOSIS — F419 Anxiety disorder, unspecified: Secondary | ICD-10-CM | POA: Insufficient documentation

## 2016-04-11 HISTORY — PX: KNEE ARTHROSCOPY WITH MEDIAL MENISECTOMY: SHX5651

## 2016-04-11 SURGERY — ARTHROSCOPY, KNEE, WITH MEDIAL MENISCECTOMY
Anesthesia: General | Site: Knee | Laterality: Right

## 2016-04-11 MED ORDER — METHOCARBAMOL 500 MG PO TABS: 500.0000 mg | ORAL_TABLET | Freq: Four times a day (QID) | ORAL | Status: DC

## 2016-04-11 MED ORDER — HYDROCODONE-ACETAMINOPHEN 5-325 MG PO TABS: 1.0000 | ORAL_TABLET | ORAL | Status: DC | PRN

## 2016-04-11 MED ORDER — GLYCOPYRROLATE 0.2 MG/ML IJ SOLN
INTRAMUSCULAR | Status: AC
  Filled 2016-04-11: qty 1

## 2016-04-11 MED ORDER — LACTATED RINGERS IV SOLN
INTRAVENOUS | Status: DC
  Administered 2016-04-11: 15:00:00 via INTRAVENOUS

## 2016-04-11 MED ORDER — PROPOFOL 10 MG/ML IV BOLUS
INTRAVENOUS | Status: DC | PRN
  Administered 2016-04-11: 180 mg via INTRAVENOUS

## 2016-04-11 MED ORDER — CEFAZOLIN SODIUM-DEXTROSE 2-4 GM/100ML-% IV SOLN
INTRAVENOUS | Status: AC
  Filled 2016-04-11: qty 100

## 2016-04-11 MED ORDER — DEXAMETHASONE SODIUM PHOSPHATE 10 MG/ML IJ SOLN
INTRAMUSCULAR | Status: AC
  Filled 2016-04-11: qty 1

## 2016-04-11 MED ORDER — DEXAMETHASONE SODIUM PHOSPHATE 10 MG/ML IJ SOLN
10.0000 mg | Freq: Once | INTRAMUSCULAR | Status: AC
  Administered 2016-04-11: 10 mg via INTRAVENOUS

## 2016-04-11 MED ORDER — GLYCOPYRROLATE 0.2 MG/ML IJ SOLN
INTRAMUSCULAR | Status: DC | PRN
  Administered 2016-04-11: 0.2 mg via INTRAVENOUS

## 2016-04-11 MED ORDER — LACTATED RINGERS IR SOLN
Status: DC | PRN
  Administered 2016-04-11 (×2): 3000 mL

## 2016-04-11 MED ORDER — ACETAMINOPHEN 10 MG/ML IV SOLN
1000.0000 mg | Freq: Once | INTRAVENOUS | Status: AC
  Administered 2016-04-11: 1000 mg via INTRAVENOUS
  Filled 2016-04-11: qty 100

## 2016-04-11 MED ORDER — FENTANYL CITRATE (PF) 100 MCG/2ML IJ SOLN
INTRAMUSCULAR | Status: DC | PRN
  Administered 2016-04-11 (×2): 25 ug via INTRAVENOUS

## 2016-04-11 MED ORDER — ACETAMINOPHEN 10 MG/ML IV SOLN
INTRAVENOUS | Status: AC
  Filled 2016-04-11: qty 100

## 2016-04-11 MED ORDER — POVIDONE-IODINE 10 % EX SWAB: 2.0000 "application " | Freq: Once | CUTANEOUS | Status: DC

## 2016-04-11 MED ORDER — ONDANSETRON HCL 4 MG/2ML IJ SOLN
INTRAMUSCULAR | Status: DC | PRN
  Administered 2016-04-11: 4 mg via INTRAVENOUS

## 2016-04-11 MED ORDER — FENTANYL CITRATE (PF) 100 MCG/2ML IJ SOLN
INTRAMUSCULAR | Status: AC
  Filled 2016-04-11: qty 2

## 2016-04-11 MED ORDER — ONDANSETRON HCL 4 MG/2ML IJ SOLN
INTRAMUSCULAR | Status: AC
  Filled 2016-04-11: qty 2

## 2016-04-11 MED ORDER — FENTANYL CITRATE (PF) 100 MCG/2ML IJ SOLN
25.0000 ug | INTRAMUSCULAR | Status: DC | PRN
  Administered 2016-04-11 (×2): 50 ug via INTRAVENOUS

## 2016-04-11 MED ORDER — CEFAZOLIN SODIUM-DEXTROSE 2-4 GM/100ML-% IV SOLN
2.0000 g | INTRAVENOUS | Status: AC
  Administered 2016-04-11: 2 g via INTRAVENOUS
  Filled 2016-04-11: qty 100

## 2016-04-11 MED ORDER — LIDOCAINE HCL (CARDIAC) 20 MG/ML IV SOLN
INTRAVENOUS | Status: DC | PRN
Start: 2016-04-11 — End: 2016-04-11
  Administered 2016-04-11: 60 mg via INTRATRACHEAL

## 2016-04-11 MED ORDER — BUPIVACAINE-EPINEPHRINE 0.25% -1:200000 IJ SOLN
INTRAMUSCULAR | Status: DC | PRN
Start: 2016-04-11 — End: 2016-04-11
  Administered 2016-04-11: 20 mL

## 2016-04-11 MED ORDER — CHLORHEXIDINE GLUCONATE 4 % EX LIQD: 60.0000 mL | Freq: Once | CUTANEOUS | Status: DC

## 2016-04-11 MED ORDER — BUPIVACAINE-EPINEPHRINE (PF) 0.25% -1:200000 IJ SOLN
INTRAMUSCULAR | Status: AC
  Filled 2016-04-11: qty 30

## 2016-04-11 MED ORDER — LIDOCAINE HCL (CARDIAC) 20 MG/ML IV SOLN
INTRAVENOUS | Status: AC
Start: 2016-04-11 — End: 2016-04-11
  Filled 2016-04-11: qty 5

## 2016-04-11 MED ORDER — PROPOFOL 10 MG/ML IV BOLUS
INTRAVENOUS | Status: AC
  Filled 2016-04-11: qty 20

## 2016-04-11 SURGICAL SUPPLY — 27 items
BANDAGE ACE 6X5 VEL STRL LF (GAUZE/BANDAGES/DRESSINGS) ×2 IMPLANT
BANDAGE ELASTIC 6 VELCRO ST LF (GAUZE/BANDAGES/DRESSINGS) ×1 IMPLANT
BLADE 4.2CUDA (BLADE) ×2 IMPLANT
CUFF TOURN SGL QUICK 34 (TOURNIQUET CUFF) ×2
CUFF TRNQT CYL 34X4X40X1 (TOURNIQUET CUFF) ×1 IMPLANT
DRAPE U-SHAPE 47X51 STRL (DRAPES) ×2 IMPLANT
DRSG EMULSION OIL 3X3 NADH (GAUZE/BANDAGES/DRESSINGS) ×2 IMPLANT
DRSG PAD ABDOMINAL 8X10 ST (GAUZE/BANDAGES/DRESSINGS) ×2 IMPLANT
DURAPREP 26ML APPLICATOR (WOUND CARE) ×2 IMPLANT
GAUZE SPONGE 4X4 12PLY STRL (GAUZE/BANDAGES/DRESSINGS) ×2 IMPLANT
GLOVE BIO SURGEON STRL SZ8 (GLOVE) ×2 IMPLANT
GLOVE BIOGEL PI IND STRL 8 (GLOVE) ×1 IMPLANT
GLOVE BIOGEL PI INDICATOR 8 (GLOVE) ×1
GOWN STRL REUS W/TWL LRG LVL3 (GOWN DISPOSABLE) ×2 IMPLANT
KIT BASIN OR (CUSTOM PROCEDURE TRAY) ×2 IMPLANT
MANIFOLD NEPTUNE II (INSTRUMENTS) ×2 IMPLANT
PACK ARTHROSCOPY WL (CUSTOM PROCEDURE TRAY) ×2 IMPLANT
PACK ICE MAXI GEL EZY WRAP (MISCELLANEOUS) ×6 IMPLANT
PADDING CAST COTTON 6X4 STRL (CAST SUPPLIES) ×3 IMPLANT
POSITIONER SURGICAL ARM (MISCELLANEOUS) ×2 IMPLANT
SET ARTHROSCOPY TUBING (MISCELLANEOUS) ×2
SET ARTHROSCOPY TUBING LN (MISCELLANEOUS) IMPLANT
SUT ETHILON 4 0 PS 2 18 (SUTURE) ×2 IMPLANT
TOWEL OR 17X26 10 PK STRL BLUE (TOWEL DISPOSABLE) ×2 IMPLANT
TUBING ARTHRO INFLOW-ONLY STRL (TUBING) ×1 IMPLANT
WAND HAND CNTRL MULTIVAC 90 (MISCELLANEOUS) ×1 IMPLANT
WRAP KNEE MAXI GEL POST OP (GAUZE/BANDAGES/DRESSINGS) ×3 IMPLANT

## 2016-04-11 NOTE — Interval H&P Note (Signed)
History and Physical Interval Note:  04/11/2016 3:53 PM  Lisa Oconnell  has presented today for surgery, with the diagnosis of right knne medial meniscal tear  The various methods of treatment have been discussed with the patient and family. After consideration of risks, benefits and other options for treatment, the patient has consented to  Procedure(s): KNEE ARTHROSCOPY WITH MEDIAL MENISECTOMY (Right) as a surgical intervention .  The patient's history has been reviewed, patient examined, no change in status, stable for surgery.  I have reviewed the patient's chart and labs.  Questions were answered to the patient's satisfaction.     Gearlean Alf

## 2016-04-11 NOTE — H&P (Signed)
  CC- Lisa Oconnell is a 75 y.o. female who presents with right knee pain.  HPI- . Knee Pain: Patient presents with knee pain involving the  right knee. Onset of the symptoms was several months ago. Inciting event: none known. Current symptoms include giving out, locking, pain located medially and stiffness. Pain is aggravated by lateral movements, pivoting, rising after sitting and walking.  Patient has had no prior knee problems. Evaluation to date: MRI: abnormal medial meniscal tear. Treatment to date: corticosteroid injection which was not very effective.  Past Medical History  Diagnosis Date  . Allergy     year round  . Arthritis   . Anxiety   . Hyperlipidemia   . Tuberculosis 1966    treated  . Colon polyp 01/2006    adenomatous    Past Surgical History  Procedure Laterality Date  . Cataract extraction w/ intraocular lens  implant, bilateral  2013  . Tubal ligation  1978  . Hand contracture release    . Bunionectomy      left  . Liposuction (15 yrs ago      stomach    Prior to Admission medications   Medication Sig Start Date End Date Taking? Authorizing Provider  ALPRAZolam Duanne Moron) 0.5 MG tablet Take 0.5 mg by mouth at bedtime.    Yes Historical Provider, MD  diclofenac sodium (VOLTAREN) 1 % GEL Apply 1 application topically 4 (four) times daily as needed (For pain.).   Yes Historical Provider, MD  FLUoxetine (PROZAC) 20 MG capsule Take 20 mg by mouth daily. 03/02/16  Yes Historical Provider, MD  Polyethyl Glycol-Propyl Glycol (SYSTANE ULTRA OP) Place 1 drop into both eyes at bedtime.   Yes Historical Provider, MD   KNEE EXAM antalgic gait, soft tissue tenderness over medial joint line, no effusion, negative drawer sign, collateral ligaments intact  Physical Examination: General appearance - alert, well appearing, and in no distress Mental status - alert, oriented to person, place, and time Chest - clear to auscultation, no wheezes, rales or rhonchi, symmetric air  entry Heart - normal rate, regular rhythm, normal S1, S2, no murmurs, rubs, clicks or gallops Abdomen - soft, nontender, nondistended, no masses or organomegaly Neurological - alert, oriented, normal speech, no focal findings or movement disorder noted   Asessment/Plan--- Right knee medial meniscal tear- - Plan right knee arthroscopy with meniscal debridement. Procedure risks and potential comps discussed with patient who elects to proceed. Goals are decreased pain and increased function with a high likelihood of achieving both

## 2016-04-11 NOTE — Anesthesia Preprocedure Evaluation (Signed)
Anesthesia Evaluation  Patient identified by MRN, date of birth, ID band Patient awake    Reviewed: Allergy & Precautions, H&P , Patient's Chart, lab work & pertinent test results, reviewed documented beta blocker date and time   Airway Mallampati: II  TM Distance: >3 FB Neck ROM: full    Dental no notable dental hx.    Pulmonary    Pulmonary exam normal breath sounds clear to auscultation       Cardiovascular  Rhythm:regular Rate:Normal     Neuro/Psych    GI/Hepatic   Endo/Other    Renal/GU      Musculoskeletal   Abdominal   Peds  Hematology   Anesthesia Other Findings O/w healthy  Reproductive/Obstetrics                             Anesthesia Physical Anesthesia Plan  ASA: II  Anesthesia Plan:    Post-op Pain Management:    Induction: Intravenous  Airway Management Planned: LMA  Additional Equipment:   Intra-op Plan:   Post-operative Plan:   Informed Consent: I have reviewed the patients History and Physical, chart, labs and discussed the procedure including the risks, benefits and alternatives for the proposed anesthesia with the patient or authorized representative who has indicated his/her understanding and acceptance.   Dental Advisory Given and Dental advisory given  Plan Discussed with: CRNA and Surgeon  Anesthesia Plan Comments: (Discussed GA with LMA, possible sore throat, potential need to switch to ETT, N/V, pulmonary aspiration. Questions answered. )        Anesthesia Quick Evaluation

## 2016-04-11 NOTE — Op Note (Signed)
Preoperative diagnosis-  Right knee medial meniscal tear  Postoperative diagnosis Right- knee medial meniscal tear plus  Medial femoral chondral defect  Procedure- Right knee arthroscopy with medial meniscal debridement and chondroplasty   Surgeon- Dione Plover. Avigdor Dollar, MD  Anesthesia-General  EBL-  Minimal  Complications- None  Condition- PACU - hemodynamically stable.  Brief clinical note- -Lisa Oconnell is a 75 y.o.  female with a several month history of right knee pain and mechanical symptoms. Exam and history suggested medial meniscal tear confirmed by MRI. The patient presents now for arthroscopy and debridement   Procedure in detail -       After successful administration of General anesthetic, a tourmiquet is placed high on the Right  thigh and the Right lower extremity is prepped and draped in the usual sterile fashion. Time out is performed by the surgical team. Standard superomedial and inferolateral portal sites are marked and incisions made with an 11 blade. The inflow cannula is passed through the superomedial portal and camera through the inferolateral portal and inflow is initiated. Arthroscopic visualization proceeds.      The undersurface of the patella and trochlea are visualized and there is mild chondromalacia patella. The medial and lateral gutters are visualized and there are no loose bodies in the gutters but a loose body is encountered in the medial compartment. Flexion and valgus force is applied to the knee and the medial compartment is entered. A spinal needle is passed into the joint through the site marked for the inferomedial portal. A small incision is made and the dilator passed into the joint. The findings for the medial compartment are unstable tear of body and posterior horn of medial meniscus with a 1 x 2 cm chondral lesion medial femoral condyle . The tear is debrided to a stable base with baskets and a shaver and sealed off with the Arthrocare. The shaver is  used to debride the unstable cartilage to a stable cartilaginous base with stable edges. It is probed and found to be stable.    The intercondylar notch is visualized and the ACL appears normal. The lateral compartment is entered and the findings are normal .     The joint is again inspected and there are no other tears, defects or loose bodies identified. The arthroscopic equipment is then removed from the inferior portals which are closed with interrupted 4-0 nylon. 20 ml of .25% Marcaine with epinephrine are injected through the inflow cannula and the cannula is then removed and the portal closed with nylon. The incisions are cleaned and dried and a bulky sterile dressing is applied. The patient is then awakened and transported to recovery in stable condition.   04/11/2016, 4:55 PM

## 2016-04-11 NOTE — Progress Notes (Signed)
Mrs .  Robar stated she  had recent  biopsy sites x2 on left lower leg.. No drainage noted. Site within normal limits.

## 2016-04-11 NOTE — Discharge Instructions (Signed)
° °Dr. Husband Aluisio °[facility]ist °[facility] °[address] °[place], [place] [address] °[phone] ° ° °Arthroscopic Procedure, Knee °An arthroscopic procedure can find what is wrong with your knee. °PROCEDURE °Arthroscopy is a surgical technique that allows your orthopedic surgeon to diagnose and treat your knee injury with accuracy. They will look into your knee through a small instrument. This is almost like a small (pencil sized) telescope. Because arthroscopy affects your knee less than open knee surgery, you can anticipate a more rapid recovery. Taking an active role by following your caregiver's instructions will help with rapid and complete recovery. Use crutches, rest, elevation, ice, and knee exercises as instructed. The length of recovery depends on various factors including type of injury, age, physical condition, medical conditions, and your rehabilitation. °Your knee is the joint between the large bones (femur and tibia) in your leg. Cartilage covers these bone ends which are smooth and slippery and allow your knee to bend and move smoothly. Two menisci, thick, semi-lunar shaped pads of cartilage which form a rim inside the joint, help absorb shock and stabilize your knee. Ligaments bind the bones together and support your knee joint. Muscles move the joint, help support your knee, and take stress off the joint itself. Because of this all programs and physical therapy to rehabilitate an injured or repaired knee require rebuilding and strengthening your muscles. °AFTER THE PROCEDURE °· After the procedure, you will be moved to a recovery area until most of the effects of the medication have worn off. Your caregiver will discuss the test results with you.  °· Only take over-the-counter or prescription medicines for pain, discomfort, or fever as directed by your caregiver.  °SEEK MEDICAL CARE IF:  °· You have increased bleeding from your wounds.  °· You see  redness, swelling, or have increasing pain in your wounds.  °· You have pus coming from your wound.  °· You have an oral temperature above 102° F (38.9° C).  °· You notice a bad smell coming from the wound or dressing.  °· You have severe pain with any motion of your knee.  °SEEK IMMEDIATE MEDICAL CARE IF:  °· You develop a rash.  °· You have difficulty breathing.  °· You have any allergic problems.  °FURTHER INSTRUCTIONS:  °· ICE to the affected knee every three hours for 30 minutes at a time and then as needed for pain and swelling.  Continue to use ice on the knee for pain and swelling from surgery. You may notice swelling that will progress down to the foot and ankle.  This is normal after surgery.  Elevate the leg when you are not up walking on it.   ° °DIET °You may resume your previous home diet once your are discharged from the hospital. ° °DRESSING / WOUND CARE / SHOWERING °You may start showering two days after being discharged home but do not submerge the incisions under water.  °Change dressing 48 hours after the procedure and then cover the small incisions with band aids until your follow up visit. °Change the surgical dressings daily and reapply a dry dressing each time.  ° °ACTIVITY °Walk with your walker as instructed. °Use walker as long as suggested by your caregivers. °Avoid periods of inactivity such as sitting longer than an hour when not asleep. This helps prevent blood clots.  °You may resume a sexual relationship in one month or when given the OK by your doctor.  °You may return to   work once you are cleared by your doctor.  °Do not drive a car for 6 weeks or until released by you surgeon.  °Do not drive while taking narcotics. ° °WEIGHT BEARING AS TOLERATED ° °POSTOPERATIVE CONSTIPATION PROTOCOL °Constipation - defined medically as fewer than three stools per week and severe constipation as less than one stool per week. ° °One of the most common issues patients have following surgery is  constipation.  Even if you have a regular bowel pattern at home, your normal regimen is likely to be disrupted due to multiple reasons following surgery.  Combination of anesthesia, postoperative narcotics, change in appetite and fluid intake all can affect your bowels.  In order to avoid complications following surgery, here are some recommendations in order to help you during your recovery period. ° °Colace (docusate) - Pick up an over-the-counter form of Colace or another stool softener and take twice a day as long as you are requiring postoperative pain medications.  Take with a full glass of water daily.  If you experience loose stools or diarrhea, hold the colace until you stool forms back up.  If your symptoms do not get better within 1 week or if they get worse, check with your doctor. ° °Dulcolax (bisacodyl) - Pick up over-the-counter and take as directed by the product packaging as needed to assist with the movement of your bowels.  Take with a full glass of water.  Use this product as needed if not relieved by Colace only.  ° °MiraLax (polyethylene glycol) - Pick up over-the-counter to have on hand.  MiraLax is a solution that will increase the amount of water in your bowels to assist with bowel movements.  Take as directed and can mix with a glass of water, juice, soda, coffee, or tea.  Take if you go more than two days without a movement. °Do not use MiraLax more than once per day. Call your doctor if you are still constipated or irregular after using this medication for 7 days in a row. ° °If you continue to have problems with postoperative constipation, please contact the office for further assistance and recommendations.  If you experience "the worst abdominal pain ever" or develop nausea or vomiting, please contact the office immediatly for further recommendations for treatment. ° °ITCHING ° If you experience itching with your medications, try taking only a single pain pill, or even half a pain pill  at a time.  You can also use Benadryl over the counter for itching or also to help with sleep.  ° °TED HOSE STOCKINGS °Wear the elastic stockings on both legs for three weeks following surgery during the day but you may remove then at night for sleeping. ° °MEDICATIONS °See your medication summary on the “After Visit Summary” that the nursing staff will review with you prior to discharge.  You may have some home medications which will be placed on hold until you complete the course of blood thinner medication.  It is important for you to complete the blood thinner medication as prescribed by your surgeon.  Continue your approved medications as instructed at time of discharge. °Do not drive while taking narcotics.  ° °PRECAUTIONS °If you experience chest pain or shortness of breath - call 911 immediately for transfer to the hospital emergency department.  °If you develop a fever greater that 101 F, purulent drainage from wound, increased redness or drainage from wound, foul odor from the wound/dressing, or calf pain - CONTACT YOUR SURGEON.   °                                                °  FOLLOW-UP APPOINTMENTS Make sure you keep all of your appointments after your operation with your surgeon and caregivers. You should call the office at [phone]  and make an appointment for approximately one week after the date of your surgery or on the date instructed by your surgeon outlined in the "After Visit Summary".  RANGE OF MOTION AND STRENGTHENING EXERCISES  Rehabilitation of the knee is important following a knee injury or an operation. After just a few days of immobilization, the muscles of the thigh which control the knee become weakened and shrink (atrophy). Knee exercises are designed to build up the tone and strength of the thigh muscles and to improve knee motion. Often times heat used for twenty to thirty minutes before working out will loosen up your tissues and help with improving the range of motion  but do not use heat for the first two weeks following surgery. These exercises can be done on a training (exercise) mat, on the floor, on a table or on a bed. Use what ever works the best and is most comfortable for you Knee exercises include:  QUAD STRENGTHENING EXERCISES Strengthening Quadriceps Sets  Tighten muscles on top of thigh by pushing knees down into floor or table. Hold for 20 seconds. Repeat 10 times. Do 2 sessions per day.     Strengthening Terminal Knee Extension  With knee bent over bolster, straighten knee by tightening muscle on top of thigh. Be sure to keep bottom of knee on bolster. Hold for 20 seconds. Repeat 10 times. Do 2 sessions per day.   Straight Leg with Bent Knee  Lie on back with opposite leg bent. Keep involved knee slightly bent at knee and raise leg 4-6". Hold for 10 seconds. Repeat 20 times per set. Do 2 sets per session. Do 2 sessions per day.     General Anesthesia, Adult, Care After Refer to this sheet in the next few weeks. These instructions provide you with information on caring for yourself after your procedure. Your health care provider may also give you more specific instructions. Your treatment has been planned according to current medical practices, but problems sometimes occur. Call your health care provider if you have any problems or questions after your procedure. WHAT TO EXPECT AFTER THE PROCEDURE After the procedure, it is typical to experience:  Sleepiness.  Nausea and vomiting. HOME CARE INSTRUCTIONS  For the first 24 hours after general anesthesia:  Have a responsible person with you.  Do not drive a car. If you are alone, do not take public transportation.  Do not drink alcohol.  Do not take medicine that has not been prescribed by your health care provider.  Do not sign important papers or make important decisions.  You may resume a normal diet and activities as directed by your health care provider.  Change  bandages (dressings) as directed.  If you have questions or problems that seem related to general anesthesia, call the hospital and ask for the anesthetist or anesthesiologist on call. SEEK MEDICAL CARE IF:  You have nausea and vomiting that continue the day after anesthesia.  You develop a rash. SEEK IMMEDIATE MEDICAL CARE IF:   You have difficulty breathing.  You have chest pain.  You have any allergic problems.   This information is not intended to replace advice given to you by your health care provider. Make sure you discuss any questions you have with your health care provider.   Document Released: 01/29/2001 Document Revised: 11/13/2014 Document Reviewed:  02/21/2012 Elsevier Interactive Patient Education [employer].

## 2016-04-11 NOTE — Anesthesia Postprocedure Evaluation (Signed)
Anesthesia Post Note  Patient: Lisa Oconnell  Procedure(s) Performed: Procedure(s) (LRB): KNEE ARTHROSCOPY WITH MEDIAL MENISECTOMY AND CONDROPLASTY (Right)  Patient location during evaluation: PACU Anesthesia Type: General Level of consciousness: sedated Pain management: satisfactory to patient Vital Signs Assessment: post-procedure vital signs reviewed and stable Respiratory status: spontaneous breathing Cardiovascular status: stable Anesthetic complications: no    Last Vitals:  Filed Vitals:   04/11/16 1815 04/11/16 1824  BP: 131/72 133/80  Pulse: 58 62  Temp: 36.5 C 36.5 C  Resp: 16 12    Last Pain:  Filed Vitals:   04/11/16 1829  PainSc: 0-No pain                 Zyanna Leisinger EDWARD

## 2016-04-11 NOTE — Anesthesia Procedure Notes (Signed)
Procedure Name: LMA Insertion Date/Time: 04/11/2016 4:17 PM Performed by: Dione Booze Pre-anesthesia Checklist: Emergency Drugs available, Suction available, Patient being monitored and Patient identified Patient Re-evaluated:Patient Re-evaluated prior to inductionOxygen Delivery Method: Circle system utilized Preoxygenation: Pre-oxygenation with 100% oxygen Intubation Type: IV induction LMA: LMA inserted LMA Size: 4.0 Number of attempts: 1 Tube secured with: Tape Dental Injury: Teeth and Oropharynx as per pre-operative assessment

## 2016-04-11 NOTE — Transfer of Care (Signed)
Immediate Anesthesia Transfer of Care Note  Patient: Lisa Oconnell  Procedure(s) Performed: Procedure(s): KNEE ARTHROSCOPY WITH MEDIAL MENISECTOMY AND CONDROPLASTY (Right)  Patient Location: PACU  Anesthesia Type:General  Level of Consciousness: awake, alert , oriented and patient cooperative  Airway & Oxygen Therapy: Patient Spontanous Breathing and Patient connected to face mask oxygen  Post-op Assessment: Report given to RN and Post -op Vital signs reviewed and stable  Post vital signs: Reviewed and stable  Last Vitals:  Filed Vitals:   04/11/16 1251  BP: 119/71  Pulse: 61  Temp: 36.7 C  Resp: 16    Last Pain: There were no vitals filed for this visit.    Patients Stated Pain Goal: 4 ([id number])  Complications: No apparent anesthesia complications

## 2016-04-12 ENCOUNTER — Encounter (HOSPITAL_COMMUNITY): Payer: Self-pay | Admitting: Orthopedic Surgery

## 2016-04-18 DIAGNOSIS — Z9889 Other specified postprocedural states: Secondary | ICD-10-CM | POA: Diagnosis not present

## 2016-04-18 DIAGNOSIS — Z4789 Encounter for other orthopedic aftercare: Secondary | ICD-10-CM | POA: Diagnosis not present

## 2016-04-27 DIAGNOSIS — Z79899 Other long term (current) drug therapy: Secondary | ICD-10-CM | POA: Diagnosis not present

## 2016-04-27 DIAGNOSIS — N390 Urinary tract infection, site not specified: Secondary | ICD-10-CM | POA: Diagnosis not present

## 2016-04-27 DIAGNOSIS — E784 Other hyperlipidemia: Secondary | ICD-10-CM | POA: Diagnosis not present

## 2016-04-27 DIAGNOSIS — M859 Disorder of bone density and structure, unspecified: Secondary | ICD-10-CM | POA: Diagnosis not present

## 2016-04-27 DIAGNOSIS — R8290 Unspecified abnormal findings in urine: Secondary | ICD-10-CM | POA: Diagnosis not present

## 2016-05-04 DIAGNOSIS — M545 Low back pain: Secondary | ICD-10-CM | POA: Diagnosis not present

## 2016-05-04 DIAGNOSIS — R3121 Asymptomatic microscopic hematuria: Secondary | ICD-10-CM | POA: Diagnosis not present

## 2016-05-04 DIAGNOSIS — G4709 Other insomnia: Secondary | ICD-10-CM | POA: Diagnosis not present

## 2016-05-04 DIAGNOSIS — F418 Other specified anxiety disorders: Secondary | ICD-10-CM | POA: Diagnosis not present

## 2016-05-04 DIAGNOSIS — M859 Disorder of bone density and structure, unspecified: Secondary | ICD-10-CM | POA: Diagnosis not present

## 2016-05-04 DIAGNOSIS — Z6824 Body mass index (BMI) 24.0-24.9, adult: Secondary | ICD-10-CM | POA: Diagnosis not present

## 2016-05-04 DIAGNOSIS — D126 Benign neoplasm of colon, unspecified: Secondary | ICD-10-CM | POA: Diagnosis not present

## 2016-05-04 DIAGNOSIS — Z0000 Encounter for general adult medical examination without abnormal findings: Secondary | ICD-10-CM | POA: Diagnosis not present

## 2016-05-04 DIAGNOSIS — Z1389 Encounter for screening for other disorder: Secondary | ICD-10-CM | POA: Diagnosis not present

## 2016-05-04 DIAGNOSIS — Z23 Encounter for immunization: Secondary | ICD-10-CM | POA: Diagnosis not present

## 2016-05-04 DIAGNOSIS — E784 Other hyperlipidemia: Secondary | ICD-10-CM | POA: Diagnosis not present

## 2016-05-04 DIAGNOSIS — K5909 Other constipation: Secondary | ICD-10-CM | POA: Diagnosis not present

## 2016-05-05 DIAGNOSIS — Z1212 Encounter for screening for malignant neoplasm of rectum: Secondary | ICD-10-CM | POA: Diagnosis not present

## 2016-05-05 LAB — IFOBT (OCCULT BLOOD): IFOBT: NEGATIVE

## 2016-05-25 DIAGNOSIS — Z4789 Encounter for other orthopedic aftercare: Secondary | ICD-10-CM | POA: Diagnosis not present

## 2016-06-02 DIAGNOSIS — M1711 Unilateral primary osteoarthritis, right knee: Secondary | ICD-10-CM | POA: Diagnosis not present

## 2016-06-02 DIAGNOSIS — M25561 Pain in right knee: Secondary | ICD-10-CM | POA: Diagnosis not present

## 2016-06-02 DIAGNOSIS — Z4789 Encounter for other orthopedic aftercare: Secondary | ICD-10-CM | POA: Diagnosis not present

## 2016-06-21 DIAGNOSIS — L280 Lichen simplex chronicus: Secondary | ICD-10-CM | POA: Diagnosis not present

## 2016-06-21 DIAGNOSIS — L570 Actinic keratosis: Secondary | ICD-10-CM | POA: Diagnosis not present

## 2016-06-21 DIAGNOSIS — D485 Neoplasm of uncertain behavior of skin: Secondary | ICD-10-CM | POA: Diagnosis not present

## 2016-06-22 DIAGNOSIS — Z4789 Encounter for other orthopedic aftercare: Secondary | ICD-10-CM | POA: Diagnosis not present

## 2016-07-20 DIAGNOSIS — M1711 Unilateral primary osteoarthritis, right knee: Secondary | ICD-10-CM | POA: Diagnosis not present

## 2016-08-18 DIAGNOSIS — M1711 Unilateral primary osteoarthritis, right knee: Secondary | ICD-10-CM | POA: Diagnosis not present

## 2016-08-25 DIAGNOSIS — M1711 Unilateral primary osteoarthritis, right knee: Secondary | ICD-10-CM | POA: Diagnosis not present

## 2016-09-01 DIAGNOSIS — M1711 Unilateral primary osteoarthritis, right knee: Secondary | ICD-10-CM | POA: Diagnosis not present

## 2016-10-26 DIAGNOSIS — M1711 Unilateral primary osteoarthritis, right knee: Secondary | ICD-10-CM | POA: Diagnosis not present

## 2016-11-25 DIAGNOSIS — M1711 Unilateral primary osteoarthritis, right knee: Secondary | ICD-10-CM | POA: Diagnosis not present

## 2016-12-02 DIAGNOSIS — M1711 Unilateral primary osteoarthritis, right knee: Secondary | ICD-10-CM | POA: Diagnosis not present

## 2016-12-14 DIAGNOSIS — M1711 Unilateral primary osteoarthritis, right knee: Secondary | ICD-10-CM | POA: Diagnosis not present

## 2016-12-25 ENCOUNTER — Other Ambulatory Visit: Payer: Self-pay | Admitting: Internal Medicine

## 2016-12-25 DIAGNOSIS — Z1231 Encounter for screening mammogram for malignant neoplasm of breast: Secondary | ICD-10-CM

## 2016-12-26 DIAGNOSIS — L570 Actinic keratosis: Secondary | ICD-10-CM | POA: Diagnosis not present

## 2016-12-26 DIAGNOSIS — Q386 Other congenital malformations of mouth: Secondary | ICD-10-CM | POA: Diagnosis not present

## 2016-12-27 ENCOUNTER — Other Ambulatory Visit: Payer: Self-pay | Admitting: Orthopedic Surgery

## 2016-12-27 DIAGNOSIS — M1711 Unilateral primary osteoarthritis, right knee: Secondary | ICD-10-CM

## 2016-12-28 ENCOUNTER — Other Ambulatory Visit: Payer: Self-pay | Admitting: Orthopedic Surgery

## 2016-12-28 DIAGNOSIS — M1711 Unilateral primary osteoarthritis, right knee: Secondary | ICD-10-CM

## 2016-12-29 ENCOUNTER — Ambulatory Visit
Admission: RE | Admit: 2016-12-29 | Discharge: 2016-12-29 | Disposition: A | Discharge status: Home / Self Care | Payer: Medicare Other | Source: Ambulatory Visit | Attending: Orthopedic Surgery | Admitting: Orthopedic Surgery

## 2016-12-29 ENCOUNTER — Other Ambulatory Visit: Payer: Medicare Other

## 2016-12-29 DIAGNOSIS — M1711 Unilateral primary osteoarthritis, right knee: Secondary | ICD-10-CM

## 2016-12-29 DIAGNOSIS — Z01818 Encounter for other preprocedural examination: Secondary | ICD-10-CM | POA: Diagnosis not present

## 2017-01-04 DIAGNOSIS — Z961 Presence of intraocular lens: Secondary | ICD-10-CM | POA: Diagnosis not present

## 2017-01-04 DIAGNOSIS — H1851 Endothelial corneal dystrophy: Secondary | ICD-10-CM | POA: Diagnosis not present

## 2017-01-04 DIAGNOSIS — H5201 Hypermetropia, right eye: Secondary | ICD-10-CM | POA: Diagnosis not present

## 2017-01-04 DIAGNOSIS — H52223 Regular astigmatism, bilateral: Secondary | ICD-10-CM | POA: Diagnosis not present

## 2017-01-22 ENCOUNTER — Ambulatory Visit
Admission: RE | Admit: 2017-01-22 | Discharge: 2017-01-22 | Disposition: A | Discharge status: Home / Self Care | Payer: Medicare Other | Source: Ambulatory Visit | Attending: Internal Medicine | Admitting: Internal Medicine

## 2017-01-22 DIAGNOSIS — Z1231 Encounter for screening mammogram for malignant neoplasm of breast: Secondary | ICD-10-CM | POA: Diagnosis not present

## 2017-02-19 ENCOUNTER — Encounter: Payer: Self-pay | Admitting: Gastroenterology

## 2017-02-28 ENCOUNTER — Other Ambulatory Visit: Payer: Self-pay | Admitting: Orthopedic Surgery

## 2017-02-28 NOTE — Progress Notes (Signed)
Please place orders in EPIC as patient is being scheduled for a pre-op appointment! Thank you! 

## 2017-03-08 ENCOUNTER — Ambulatory Visit: Payer: Self-pay | Admitting: Orthopedic Surgery

## 2017-03-22 ENCOUNTER — Other Ambulatory Visit (HOSPITAL_COMMUNITY): Payer: Self-pay | Admitting: *Deleted

## 2017-03-22 DIAGNOSIS — L859 Epidermal thickening, unspecified: Secondary | ICD-10-CM | POA: Diagnosis not present

## 2017-03-22 DIAGNOSIS — D225 Melanocytic nevi of trunk: Secondary | ICD-10-CM | POA: Diagnosis not present

## 2017-03-22 DIAGNOSIS — Z86018 Personal history of other benign neoplasm: Secondary | ICD-10-CM | POA: Diagnosis not present

## 2017-03-22 DIAGNOSIS — D1801 Hemangioma of skin and subcutaneous tissue: Secondary | ICD-10-CM | POA: Diagnosis not present

## 2017-03-22 DIAGNOSIS — L814 Other melanin hyperpigmentation: Secondary | ICD-10-CM | POA: Diagnosis not present

## 2017-03-22 DIAGNOSIS — L821 Other seborrheic keratosis: Secondary | ICD-10-CM | POA: Diagnosis not present

## 2017-03-22 NOTE — Patient Instructions (Addendum)
Lisa Oconnell  03/22/2017   Your procedure is scheduled on: 03-28-17  Report to [facility] Main  Entrance   Report to admitting at 115 PM   Call this number if you have problems the morning of surgery [phone]   Remember: ONLY 1 PERSON MAY GO WITH YOU TO SHORT STAY TO GET  READY MORNING OF [place].  Do not eat food :After Midnight.MAY HAVE CLEAR LIQUIDS FROM MIDNIGHT UNTIL 945 AM DAY OF SURGERY, THEN NOTHING BY MOUTH AFTER 945 AM DAY OF SURGERY.     Take these medicines the morning of surgery with A SIP OF WATER: FLUOXETINE (PROZAC)              You may not have any metal on your body including hair pins and              piercings  Do not wear jewelry, make-up, lotions, powders or perfumes, deodorant             Do not wear nail polish.  Do not shave  48 hours prior to surgery.     Do not bring valuables to the hospital. [place].  Contacts, dentures or bridgework may not be worn into surgery.  Leave suitcase in the car. After surgery it may be brought to your room.                  Please read over the following fact sheets you were given: _____________________________________________________________________                CLEAR LIQUID DIET   Foods Allowed                                                                     Foods Excluded  Coffee and tea, regular and decaf                             liquids that you cannot  Plain Jell-O in any flavor                                             see through such as: Fruit ices (not with fruit pulp)                                     milk, soups, orange juice  Iced Popsicles                                    All solid food Carbonated beverages, regular and diet  Cranberry, grape and apple juices Sports drinks like Gatorade Lightly seasoned clear broth or consume(fat free) Sugar, honey syrup  Sample  Menu Breakfast                                Lunch                                     Supper Cranberry juice                    Beef broth                            Chicken broth Jell-O                                     Grape juice                           Apple juice Coffee or tea                        Jell-O                                      Popsicle                                                Coffee or tea                        Coffee or tea  _____________________________________________________________________  [facility] - Preparing for Surgery Before surgery, you can play an important role.  Because skin is not sterile, your skin needs to be as free of germs as possible.  You can reduce the number of germs on your skin by washing with CHG (chlorahexidine gluconate) soap before surgery.  CHG is an antiseptic cleaner which kills germs and bonds with the skin to continue killing germs even after washing. Please DO NOT use if you have an allergy to CHG or antibacterial soaps.  If your skin becomes reddened/irritated stop using the CHG and inform your nurse when you arrive at Short Stay. Do not shave (including legs and underarms) for at least 48 hours prior to the first CHG shower.  You may shave your face/neck. Please follow these instructions carefully:  1.  Shower with CHG Soap the night before surgery and the  morning of Surgery.  2.  If you choose to wash your hair, wash your hair first as usual with your  normal  shampoo.  3.  After you shampoo, rinse your hair and body thoroughly to remove the  shampoo.                           4.  Use CHG as you would any other liquid soap.  You can apply chg directly  to the skin and wash  Gently with a scrungie or clean washcloth.  5.  Apply the CHG Soap to your body ONLY FROM THE NECK DOWN.   Do not use on face/ open                           Wound or open sores. Avoid contact with eyes, ears mouth and genitals  (private parts).                       Wash face,  Genitals (private parts) with your normal soap.             6.  Wash thoroughly, paying special attention to the area where your surgery  will be performed.  7.  Thoroughly rinse your body with warm water from the neck down.  8.  DO NOT shower/wash with your normal soap after using and rinsing off  the CHG Soap.                9.  Pat yourself dry with a clean towel.            10.  Wear clean pajamas.            11.  Place clean sheets on your bed the night of your first shower and do not  sleep with pets. Day of Surgery : Do not apply any lotions/deodorants the morning of surgery.  Please wear clean clothes to the hospital/surgery center.  FAILURE TO FOLLOW THESE INSTRUCTIONS MAY RESULT IN THE CANCELLATION OF YOUR SURGERY PATIENT SIGNATURE_________________________________  NURSE SIGNATURE__________________________________  ________________________________________________________________________   Adam Phenix  An incentive spirometer is a tool that can help keep your lungs clear and active. This tool measures how well you are filling your lungs with each breath. Taking long deep breaths may help reverse or decrease the chance of developing breathing (pulmonary) problems (especially infection) following:  A long period of time when you are unable to move or be active. BEFORE THE PROCEDURE   If the spirometer includes an indicator to show your best effort, your nurse or respiratory therapist will set it to a desired goal.  If possible, sit up straight or lean slightly forward. Try not to slouch.  Hold the incentive spirometer in an upright position. INSTRUCTIONS FOR USE  1. Sit on the edge of your bed if possible, or sit up as far as you can in bed or on a chair. 2. Hold the incentive spirometer in an upright position. 3. Breathe out normally. 4. Place the mouthpiece in your mouth and seal your lips tightly around  it. 5. Breathe in slowly and as deeply as possible, raising the piston or the ball toward the top of the column. 6. Hold your breath for 3-5 seconds or for as long as possible. Allow the piston or ball to fall to the bottom of the column. 7. Remove the mouthpiece from your mouth and breathe out normally. 8. Rest for a few seconds and repeat Steps 1 through 7 at least 10 times every 1-2 hours when you are awake. Take your time and take a few normal breaths between deep breaths. 9. The spirometer may include an indicator to show your best effort. Use the indicator as a goal to work toward during each repetition. 10. After each set of 10 deep breaths, practice coughing to be sure your lungs are clear. If you have an incision (the cut made at the time of  surgery), support your incision when coughing by placing a pillow or rolled up towels firmly against it. Once you are able to get out of bed, walk around indoors and cough well. You may stop using the incentive spirometer when instructed by your caregiver.  RISKS AND COMPLICATIONS  Take your time so you do not get dizzy or light-headed.  If you are in pain, you may need to take or ask for pain medication before doing incentive spirometry. It is harder to take a deep breath if you are having pain. AFTER USE  Rest and breathe slowly and easily.  It can be helpful to keep track of a log of your progress. Your caregiver can provide you with a simple table to help with this. If you are using the spirometer at home, follow these instructions: [place] IF:   You are having difficultly using the spirometer.  You have trouble using the spirometer as often as instructed.  Your pain medication is not giving enough relief while using the spirometer.  You develop fever of 100.5 F (38.1 C) or higher. SEEK IMMEDIATE MEDICAL CARE IF:   You cough up bloody sputum that had not been present before.  You develop fever of 102 F (38.9 C) or  greater.  You develop worsening pain at or near the incision site. MAKE SURE YOU:   Understand these instructions.  Will watch your condition.  Will get help right away if you are not doing well or get worse. Document Released: 03/05/2007 Document Revised: 01/15/2012 Document Reviewed: 05/06/2007 Exit[employer] Patient Information 2014 Exit[employer], [place].   ________________________________________________________________________  WHAT IS A BLOOD TRANSFUSION? Blood Transfusion Information  A transfusion is the replacement of blood or some of its parts. Blood is made up of multiple cells which provide different functions.  Red blood cells carry oxygen and are used for blood loss replacement.  White blood cells fight against infection.  Platelets control bleeding.  Plasma helps clot blood.  Other blood products are available for specialized needs, such as hemophilia or other clotting disorders. BEFORE THE TRANSFUSION  Who gives blood for transfusions?   Healthy volunteers who are fully evaluated to make sure their blood is safe. This is blood bank blood. Transfusion therapy is the safest it has ever been in the practice of medicine. Before blood is taken from a donor, a complete history is taken to make sure that person has no history of diseases nor engages in risky social behavior (examples are intravenous drug use or sexual activity with multiple partners). The donor's travel history is screened to minimize risk of transmitting infections, such as malaria. The donated blood is tested for signs of infectious diseases, such as HIV and hepatitis. The blood is then tested to be sure it is compatible with you in order to minimize the chance of a transfusion reaction. If you or a relative donates blood, this is often done in anticipation of surgery and is not appropriate for emergency situations. It takes many days to process the donated blood. RISKS AND COMPLICATIONS Although transfusion therapy  is very safe and saves many lives, the main dangers of transfusion include:   Getting an infectious disease.  Developing a transfusion reaction. This is an allergic reaction to something in the blood you were given. Every precaution is taken to prevent this. The decision to have a blood transfusion has been considered carefully by your caregiver before blood is given. Blood is not given unless the benefits outweigh the risks. AFTER THE  TRANSFUSION  Right after receiving a blood transfusion, you will usually feel much better and more energetic. This is especially true if your red blood cells have gotten low (anemic). The transfusion raises the level of the red blood cells which carry oxygen, and this usually causes an energy increase.  The nurse administering the transfusion will monitor you carefully for complications. HOME CARE INSTRUCTIONS  No special instructions are needed after a transfusion. You may find your energy is better. Speak with your caregiver about any limitations on activity for underlying diseases you may have. SEEK MEDICAL CARE IF:   Your condition is not improving after your transfusion.  You develop redness or irritation at the intravenous (IV) site. SEEK IMMEDIATE MEDICAL CARE IF:  Any of the following symptoms occur over the next 12 hours:  Shaking chills.  You have a temperature by mouth above 102 F (38.9 C), not controlled by medicine.  Chest, back, or muscle pain.  People around you feel you are not acting correctly or are confused.  Shortness of breath or difficulty breathing.  Dizziness and fainting.  You get a rash or develop hives.  You have a decrease in urine output.  Your urine turns a dark color or changes to pink, red, or brown. Any of the following symptoms occur over the next 10 days:  You have a temperature by mouth above 102 F (38.9 C), not controlled by medicine.  Shortness of breath.  Weakness after normal activity.  The white  part of the eye turns yellow (jaundice).  You have a decrease in the amount of urine or are urinating less often.  Your urine turns a dark color or changes to pink, red, or brown. Document Released: 10/20/2000 Document Revised: 01/15/2012 Document Reviewed: 06/08/2008 [facility] Patient Information 2014 [place], [place].  _______________________________________________________________________

## 2017-03-22 NOTE — Progress Notes (Signed)
MEDICAL CLEARANCE NOTE DR Marton Redwood 05-04-16 ON CHART FOR 03-28-16 SURGERY.

## 2017-03-23 ENCOUNTER — Encounter (HOSPITAL_COMMUNITY): Payer: Self-pay

## 2017-03-23 ENCOUNTER — Encounter (HOSPITAL_COMMUNITY)
Admission: RE | Admit: 2017-03-23 | Discharge: 2017-03-23 | Disposition: A | Discharge status: Home / Self Care | Payer: Medicare Other | Source: Ambulatory Visit | Attending: Orthopedic Surgery | Admitting: Orthopedic Surgery

## 2017-03-23 DIAGNOSIS — Z01812 Encounter for preprocedural laboratory examination: Secondary | ICD-10-CM | POA: Insufficient documentation

## 2017-03-23 DIAGNOSIS — M1711 Unilateral primary osteoarthritis, right knee: Secondary | ICD-10-CM | POA: Diagnosis not present

## 2017-03-23 LAB — COMPREHENSIVE METABOLIC PANEL
ALT: 14 U/L (ref 14–54)
ANION GAP: 7 (ref 5–15)
AST: 17 U/L (ref 15–41)
Albumin: 4 g/dL (ref 3.5–5.0)
Alkaline Phosphatase: 86 U/L (ref 38–126)
BUN: 22 mg/dL — ABNORMAL HIGH (ref 6–20)
CHLORIDE: 105 mmol/L (ref 101–111)
CO2: 27 mmol/L (ref 22–32)
Calcium: 9.3 mg/dL (ref 8.9–10.3)
Creatinine, Ser: 0.78 mg/dL (ref 0.44–1.00)
Glucose, Bld: 99 mg/dL (ref 65–99)
POTASSIUM: 4.3 mmol/L (ref 3.5–5.1)
SODIUM: 139 mmol/L (ref 135–145)
Total Bilirubin: 0.9 mg/dL (ref 0.3–1.2)
Total Protein: 7.4 g/dL (ref 6.5–8.1)

## 2017-03-23 LAB — CBC
HCT: 39.3 % (ref 36.0–46.0)
Hemoglobin: 13.1 g/dL (ref 12.0–15.0)
MCH: 30.4 pg (ref 26.0–34.0)
MCHC: 33.3 g/dL (ref 30.0–36.0)
MCV: 91.2 fL (ref 78.0–100.0)
Platelets: 317 10*3/uL (ref 150–400)
RBC: 4.31 MIL/uL (ref 3.87–5.11)
RDW: 13.2 % (ref 11.5–15.5)
WBC: 5.9 10*3/uL (ref 4.0–10.5)

## 2017-03-23 LAB — ABO/RH: ABO/RH(D): O NEG

## 2017-03-23 LAB — SURGICAL PCR SCREEN
MRSA, PCR: NEGATIVE
Staphylococcus aureus: NEGATIVE

## 2017-03-23 LAB — APTT: aPTT: 29 seconds (ref 24–36)

## 2017-03-23 LAB — PROTIME-INR
INR: 0.94
PROTHROMBIN TIME: 12.6 s (ref 11.4–15.2)

## 2017-03-27 ENCOUNTER — Ambulatory Visit: Payer: Self-pay | Admitting: Orthopedic Surgery

## 2017-03-27 NOTE — H&P (Signed)
Lisa Oconnell DOB: September 10, 1941 Married / Language: English / Race: White Female Date of Admission:  03/28/17 CC:  Right Knee pain History of Present Illness The patient is a 76 year old female who comes in  for a preoperative History and Physical. The patient is scheduled for a right unicompartmental knee arthroplasty to be performed by Dr. Dione Plover. Aluisio, MD at [facility] on 03-28-2017. The patient is a 76 year old female who presented for follow up of their knee. The patient is being followed for their right knee pain and osteoarthritis. They are now months out from cortisone injection. Symptoms reported include: pain, swelling, aching and difficulty ambulating. The patient feels that they are doing poorly and report their pain level to be mild to moderate (varies based on activity). The following medication has been used for pain control: antiinflammatory medication (Aleve). The patient presents today following MRI. The patient has not gotten any relief of their symptoms with bracing or Cortisone injections. She continues to have the significant medial side discomfort in the right knee. We obtained the MRI to see if this is potentially recurrent tear or whether she had more advanced degenerative change in medial compartment the plain films showed. She has had cortisone and visco supplement injections without benefit. She had the arthroscopy which helped for a short amount of time. She is now ready to get her knee fixed. They have been treated conservatively in the past for the above stated problem and despite conservative measures, they continue to have progressive pain and severe functional limitations and dysfunction. They have failed non-operative management including home exercise, medications, and injections. It is felt that they would benefit from undergoing total joint replacement. Risks and benefits of the procedure have been discussed with the patient and they elect to proceed with  surgery. There are no active contraindications to surgery such as ongoing infection or rapidly progressive neurological disease.  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 Mouth Blisters  Family History Depression  sister Diabetes Mellitus  mother, sister and brother  Social History Alcohol use  current drinker; 5-7 per week Tobacco use  never smoker Children  2 Current work status  retired Engineer, agricultural (Currently)  no Drug/Alcohol Rehab (Previously)  no Exercise  Exercises weekly; does other Illicit drug use  no Living situation  live with spouse Marital status  married Number of flights of stairs before winded  2-3 Pain Contract  no Advance Directives  Living Will, Healthcare POA  Medication History PROzac (Oral) Specific strength unknown - Active. Xanax (Oral) Specific strength unknown - Active. Biotin (Oral) Specific strength unknown - Active. Aleve (Oral) Specific strength unknown - Active.  Past Surgical History Breast Biopsy  right Cataract Surgery  bilateral Colon Polyp Removal - Open  Foot Surgery  left Other Orthopaedic Surgery  Tubal Ligation   Review of Systems  General Not Present- Chills, Fatigue, Fever, Memory Loss, Night Sweats, Weight Gain and Weight Loss. Skin Not Present- Eczema, Hives, Itching, Lesions and Rash. HEENT Not Present- Dentures, Double Vision, Headache, Hearing Loss, Tinnitus and Visual Loss. Respiratory Not Present- Allergies, Chronic Cough, Coughing up blood, Shortness of breath at  rest and Shortness of  breath with exertion. Cardiovascular Not Present- Chest Pain, Difficulty Breathing Lying Down, Murmur, Palpitations, Racing/skipping heartbeats and Swelling. Gastrointestinal Not Present- Abdominal Pain, Bloody Stool, Constipation, Diarrhea, Difficulty Swallowing, Heartburn, Jaundice, Loss of appetitie, Nausea and Vomiting. Female Genitourinary Not Present- Blood in Urine, Discharge, Flank Pain, Incontinence, Painful Urination, Urgency, Urinary frequency, Urinary Retention, Urinating at Night and Weak urinary stream. Musculoskeletal Present- Back Pain and Joint Pain. Not Present- Joint Swelling, Morning Stiffness, Muscle Pain, Muscle Weakness and Spasms. Neurological Not Present- Blackout spells, Difficulty with balance, Dizziness, Paralysis, Tremor and Weakness. Psychiatric Not Present- Insomnia.  Vitals  Weight: 128 lb Height: 61in Weight was reported by patient. Height was reported by patient. Body Surface Area: 1.56 m Body Mass Index: 24.19 kg/m  Pulse: 58 (Regular)  BP: 116/68 (Sitting, Right Arm, Standard)   Physical Exam General Mental Status -Alert, cooperative and good historian. General Appearance-pleasant, Not in acute distress. Orientation-Oriented X3. Build & Nutrition-Well nourished and Well developed.  Head and Neck Head-normocephalic, atraumatic . Neck Global Assessment - supple, no bruit auscultated on the right, no bruit auscultated on the left.  Eye Vision-Wears corrective lenses. Pupil - Bilateral-Regular and Round. Motion - Bilateral-EOMI.  Chest and Lung Exam Auscultation Breath sounds - clear at anterior chest wall and clear at posterior chest wall. Adventitious sounds - No Adventitious sounds.  Cardiovascular Auscultation Rhythm - Regular rate and rhythm. Heart Sounds - S1 WNL and S2 WNL. Murmurs & Other Heart Sounds - Auscultation of the heart reveals - No Murmurs.  Abdomen Palpation/Percussion Tenderness - Abdomen is  non-tender to palpation. Rigidity (guarding) - Abdomen is soft. Auscultation Auscultation of the abdomen reveals - Bowel sounds normal.  Female Genitourinary Note: Not done, not pertinent to present illness   Musculoskeletal Note: On exam, she is in no distress. Her right hip has normal range of motion with no discomfort. Her right knee shows no effusion. Her range is about 0 to 135. She does have some significant medial tenderness. There is no lateral tenderness. There is no instability noted.  We reviewed the MRI scan. She does have pretty significant degenerative change in the medial compartment with a lot of subchondral cystic formation in the femur as well as tibia. She does not have a recurrent tear. She has a relatively normal looking patellofemoral and lateral compartment.   Assessment & Plan Primary osteoarthritis of right knee (Principal Diagnosis) (M17.11)  Note:Surgical Plans: Right Uni Knee Replacement  Disposition: Home  PCP: Dr. Brigitte Pulse - Patient has been seen preoperatively and felt to be stable for surgery.  IV TXA  Anesthesia Issues:None  Patient was instructed on what medications to stop prior to surgery.  Signed electronically by Joelene Millin, III PA-C

## 2017-03-28 ENCOUNTER — Ambulatory Visit (HOSPITAL_COMMUNITY): Payer: Medicare Other | Admitting: Certified Registered Nurse Anesthetist

## 2017-03-28 ENCOUNTER — Observation Stay (HOSPITAL_COMMUNITY)
Admission: RE | Admit: 2017-03-28 | Discharge: 2017-03-29 | Disposition: A | Discharge status: Home / Self Care | Payer: Medicare Other | Source: Ambulatory Visit | Attending: Orthopedic Surgery | Admitting: Orthopedic Surgery

## 2017-03-28 ENCOUNTER — Encounter (HOSPITAL_COMMUNITY): Payer: Self-pay | Admitting: *Deleted

## 2017-03-28 ENCOUNTER — Encounter (HOSPITAL_COMMUNITY)
Admission: RE | Disposition: A | Discharge status: Home / Self Care | Payer: Self-pay | Source: Ambulatory Visit | Attending: Orthopedic Surgery

## 2017-03-28 DIAGNOSIS — M1711 Unilateral primary osteoarthritis, right knee: Principal | ICD-10-CM | POA: Insufficient documentation

## 2017-03-28 DIAGNOSIS — M1710 Unilateral primary osteoarthritis, unspecified knee: Secondary | ICD-10-CM

## 2017-03-28 DIAGNOSIS — F419 Anxiety disorder, unspecified: Secondary | ICD-10-CM | POA: Insufficient documentation

## 2017-03-28 DIAGNOSIS — G8918 Other acute postprocedural pain: Secondary | ICD-10-CM | POA: Diagnosis not present

## 2017-03-28 DIAGNOSIS — E785 Hyperlipidemia, unspecified: Secondary | ICD-10-CM | POA: Insufficient documentation

## 2017-03-28 DIAGNOSIS — Z79899 Other long term (current) drug therapy: Secondary | ICD-10-CM | POA: Diagnosis not present

## 2017-03-28 DIAGNOSIS — M179 Osteoarthritis of knee, unspecified: Secondary | ICD-10-CM | POA: Diagnosis present

## 2017-03-28 HISTORY — PX: PARTIAL KNEE ARTHROPLASTY: SHX2174

## 2017-03-28 LAB — TYPE AND SCREEN
ABO/RH(D): O NEG
ANTIBODY SCREEN: NEGATIVE

## 2017-03-28 SURGERY — ARTHROPLASTY, KNEE, UNICOMPARTMENTAL
Anesthesia: Spinal | Site: Knee | Laterality: Right

## 2017-03-28 MED ORDER — LIDOCAINE 2% (20 MG/ML) 5 ML SYRINGE
INTRAMUSCULAR | Status: AC
  Filled 2017-03-28: qty 5

## 2017-03-28 MED ORDER — FENTANYL CITRATE (PF) 100 MCG/2ML IJ SOLN
INTRAMUSCULAR | Status: AC
  Filled 2017-03-28: qty 2

## 2017-03-28 MED ORDER — ACETAMINOPHEN 10 MG/ML IV SOLN
1000.0000 mg | Freq: Once | INTRAVENOUS | Status: AC
  Administered 2017-03-28: 1000 mg via INTRAVENOUS

## 2017-03-28 MED ORDER — DIPHENHYDRAMINE HCL 12.5 MG/5ML PO ELIX: 12.5000 mg | ORAL_SOLUTION | ORAL | Status: DC | PRN

## 2017-03-28 MED ORDER — ONDANSETRON HCL 4 MG/2ML IJ SOLN
INTRAMUSCULAR | Status: DC | PRN
Start: 2017-03-28 — End: 2017-03-28
  Administered 2017-03-28: 4 mg via INTRAVENOUS

## 2017-03-28 MED ORDER — BUPIVACAINE IN DEXTROSE 0.75-8.25 % IT SOLN
INTRATHECAL | Status: DC | PRN
  Administered 2017-03-28: 1.8 mL via INTRATHECAL

## 2017-03-28 MED ORDER — ONDANSETRON HCL 4 MG/2ML IJ SOLN
INTRAMUSCULAR | Status: AC
  Filled 2017-03-28: qty 2

## 2017-03-28 MED ORDER — CHLORHEXIDINE GLUCONATE 4 % EX LIQD: 60.0000 mL | Freq: Once | CUTANEOUS | Status: DC

## 2017-03-28 MED ORDER — FENTANYL CITRATE (PF) 100 MCG/2ML IJ SOLN
INTRAMUSCULAR | Status: DC | PRN
  Administered 2017-03-28: 50 ug via INTRAVENOUS

## 2017-03-28 MED ORDER — POLYETHYLENE GLYCOL 3350 17 G PO PACK: 17.0000 g | PACK | Freq: Every day | ORAL | Status: DC | PRN

## 2017-03-28 MED ORDER — 0.9 % SODIUM CHLORIDE (POUR BTL) OPTIME
TOPICAL | Status: DC | PRN
  Administered 2017-03-28: 1000 mL

## 2017-03-28 MED ORDER — DEXAMETHASONE SODIUM PHOSPHATE 10 MG/ML IJ SOLN
10.0000 mg | Freq: Once | INTRAMUSCULAR | Status: AC
  Administered 2017-03-28: 10 mg via INTRAVENOUS

## 2017-03-28 MED ORDER — DEXAMETHASONE SODIUM PHOSPHATE 10 MG/ML IJ SOLN
INTRAMUSCULAR | Status: AC
  Filled 2017-03-28: qty 1

## 2017-03-28 MED ORDER — PROPOFOL 500 MG/50ML IV EMUL
INTRAVENOUS | Status: DC | PRN
  Administered 2017-03-28: 50 ug/kg/min via INTRAVENOUS

## 2017-03-28 MED ORDER — ONDANSETRON HCL 4 MG/2ML IJ SOLN: 4.0000 mg | Freq: Four times a day (QID) | INTRAMUSCULAR | Status: DC | PRN

## 2017-03-28 MED ORDER — PROMETHAZINE HCL 25 MG/ML IJ SOLN: 6.2500 mg | INTRAMUSCULAR | Status: DC | PRN

## 2017-03-28 MED ORDER — CEFAZOLIN SODIUM-DEXTROSE 2-4 GM/100ML-% IV SOLN
2.0000 g | INTRAVENOUS | Status: AC
  Administered 2017-03-28: 2 g via INTRAVENOUS

## 2017-03-28 MED ORDER — OXYCODONE HCL 5 MG PO TABS
5.0000 mg | ORAL_TABLET | ORAL | Status: DC | PRN
  Administered 2017-03-28: 10 mg via ORAL
  Administered 2017-03-28: 5 mg via ORAL
  Administered 2017-03-29 (×3): 10 mg via ORAL
  Filled 2017-03-28: qty 1
  Filled 2017-03-28 (×5): qty 2

## 2017-03-28 MED ORDER — FLEET ENEMA 7-19 GM/118ML RE ENEM: 1.0000 | ENEMA | Freq: Once | RECTAL | Status: DC | PRN

## 2017-03-28 MED ORDER — EPHEDRINE 5 MG/ML INJ
INTRAVENOUS | Status: AC
  Filled 2017-03-28: qty 10

## 2017-03-28 MED ORDER — DOCUSATE SODIUM 100 MG PO CAPS
100.0000 mg | ORAL_CAPSULE | Freq: Two times a day (BID) | ORAL | Status: DC
  Administered 2017-03-28 – 2017-03-29 (×2): 100 mg via ORAL
  Filled 2017-03-28 (×2): qty 1

## 2017-03-28 MED ORDER — FENTANYL CITRATE (PF) 100 MCG/2ML IJ SOLN
INTRAMUSCULAR | Status: AC
  Administered 2017-03-28: 50 ug via INTRAVENOUS
  Filled 2017-03-28: qty 2

## 2017-03-28 MED ORDER — MENTHOL 3 MG MT LOZG: 1.0000 | LOZENGE | OROMUCOSAL | Status: DC | PRN

## 2017-03-28 MED ORDER — SODIUM CHLORIDE 0.9 % IV SOLN
INTRAVENOUS | Status: DC
  Administered 2017-03-28: 19:00:00 via INTRAVENOUS

## 2017-03-28 MED ORDER — BISACODYL 10 MG RE SUPP: 10.0000 mg | Freq: Every day | RECTAL | Status: DC | PRN

## 2017-03-28 MED ORDER — ACETAMINOPHEN 500 MG PO TABS
1000.0000 mg | ORAL_TABLET | Freq: Four times a day (QID) | ORAL | Status: DC
  Administered 2017-03-28 – 2017-03-29 (×3): 1000 mg via ORAL
  Filled 2017-03-28 (×3): qty 2

## 2017-03-28 MED ORDER — PHENOL 1.4 % MT LIQD: 1.0000 | OROMUCOSAL | Status: DC | PRN

## 2017-03-28 MED ORDER — BUPIVACAINE LIPOSOME 1.3 % IJ SUSP
INTRAMUSCULAR | Status: DC | PRN
  Administered 2017-03-28: 20 mL

## 2017-03-28 MED ORDER — MIDAZOLAM HCL 2 MG/2ML IJ SOLN
2.0000 mg | Freq: Once | INTRAMUSCULAR | Status: AC
  Administered 2017-03-28: 1 mg via INTRAVENOUS

## 2017-03-28 MED ORDER — FENTANYL CITRATE (PF) 100 MCG/2ML IJ SOLN
100.0000 ug | Freq: Once | INTRAMUSCULAR | Status: AC
  Administered 2017-03-28: 50 ug via INTRAVENOUS

## 2017-03-28 MED ORDER — RIVAROXABAN 10 MG PO TABS
10.0000 mg | ORAL_TABLET | Freq: Every day | ORAL | Status: DC
  Administered 2017-03-29: 10 mg via ORAL

## 2017-03-28 MED ORDER — EPHEDRINE SULFATE 50 MG/ML IJ SOLN
INTRAMUSCULAR | Status: DC | PRN
  Administered 2017-03-28: 10 mg via INTRAVENOUS
  Administered 2017-03-28: 5 mg via INTRAVENOUS

## 2017-03-28 MED ORDER — ROPIVACAINE HCL 5 MG/ML IJ SOLN
INTRAMUSCULAR | Status: DC | PRN
  Administered 2017-03-28: 30 mL via PERINEURAL

## 2017-03-28 MED ORDER — METHOCARBAMOL 1000 MG/10ML IJ SOLN
500.0000 mg | Freq: Four times a day (QID) | INTRAVENOUS | Status: DC | PRN
  Filled 2017-03-28: qty 5

## 2017-03-28 MED ORDER — MORPHINE SULFATE (PF) 4 MG/ML IV SOLN
1.0000 mg | INTRAVENOUS | Status: DC | PRN
  Administered 2017-03-28: 1 mg via INTRAVENOUS
  Filled 2017-03-28: qty 1

## 2017-03-28 MED ORDER — SODIUM CHLORIDE 0.9 % IR SOLN
Status: DC | PRN
  Administered 2017-03-28: 1000 mL

## 2017-03-28 MED ORDER — LACTATED RINGERS IV SOLN
INTRAVENOUS | Status: DC
  Administered 2017-03-28: 14:00:00 via INTRAVENOUS

## 2017-03-28 MED ORDER — BUPIVACAINE LIPOSOME 1.3 % IJ SUSP
20.0000 mL | Freq: Once | INTRAMUSCULAR | Status: DC
  Filled 2017-03-28: qty 20

## 2017-03-28 MED ORDER — MIDAZOLAM HCL 2 MG/2ML IJ SOLN
INTRAMUSCULAR | Status: AC
  Administered 2017-03-28: 1 mg via INTRAVENOUS
  Filled 2017-03-28: qty 2

## 2017-03-28 MED ORDER — METOCLOPRAMIDE HCL 5 MG/ML IJ SOLN: 5.0000 mg | Freq: Three times a day (TID) | INTRAMUSCULAR | Status: DC | PRN

## 2017-03-28 MED ORDER — PROPOFOL 10 MG/ML IV BOLUS
INTRAVENOUS | Status: AC
  Filled 2017-03-28: qty 40

## 2017-03-28 MED ORDER — DEXAMETHASONE SODIUM PHOSPHATE 10 MG/ML IJ SOLN
10.0000 mg | Freq: Once | INTRAMUSCULAR | Status: AC
  Administered 2017-03-29: 10 mg via INTRAVENOUS
  Filled 2017-03-28: qty 1

## 2017-03-28 MED ORDER — CEFAZOLIN SODIUM-DEXTROSE 2-4 GM/100ML-% IV SOLN
INTRAVENOUS | Status: AC
Start: 2017-03-28 — End: 2017-03-28
  Filled 2017-03-28: qty 100

## 2017-03-28 MED ORDER — PROPOFOL 10 MG/ML IV BOLUS
INTRAVENOUS | Status: DC | PRN
  Administered 2017-03-28: 30 mg via INTRAVENOUS
  Administered 2017-03-28 (×7): 20 mg via INTRAVENOUS

## 2017-03-28 MED ORDER — BUPIVACAINE HCL (PF) 0.25 % IJ SOLN
INTRAMUSCULAR | Status: AC
  Filled 2017-03-28: qty 30

## 2017-03-28 MED ORDER — ONDANSETRON HCL 4 MG PO TABS: 4.0000 mg | ORAL_TABLET | Freq: Four times a day (QID) | ORAL | Status: DC | PRN

## 2017-03-28 MED ORDER — TRANEXAMIC ACID 1000 MG/10ML IV SOLN
1000.0000 mg | INTRAVENOUS | Status: AC
  Administered 2017-03-28: 1000 mg via INTRAVENOUS
  Filled 2017-03-28: qty 1100

## 2017-03-28 MED ORDER — CEFAZOLIN SODIUM-DEXTROSE 2-4 GM/100ML-% IV SOLN
2.0000 g | Freq: Four times a day (QID) | INTRAVENOUS | Status: AC
  Administered 2017-03-28 – 2017-03-29 (×2): 2 g via INTRAVENOUS
  Filled 2017-03-28 (×2): qty 100

## 2017-03-28 MED ORDER — METHOCARBAMOL 500 MG PO TABS
500.0000 mg | ORAL_TABLET | Freq: Four times a day (QID) | ORAL | Status: DC | PRN
  Administered 2017-03-28 – 2017-03-29 (×2): 500 mg via ORAL
  Filled 2017-03-28 (×2): qty 1

## 2017-03-28 MED ORDER — PROPOFOL 10 MG/ML IV BOLUS
INTRAVENOUS | Status: AC
  Filled 2017-03-28: qty 20

## 2017-03-28 MED ORDER — SODIUM CHLORIDE 0.9 % IJ SOLN
INTRAMUSCULAR | Status: AC
  Filled 2017-03-28: qty 50

## 2017-03-28 MED ORDER — SODIUM CHLORIDE 0.9 % IJ SOLN
INTRAMUSCULAR | Status: DC | PRN
  Administered 2017-03-28: 30 mL via INTRAVENOUS

## 2017-03-28 MED ORDER — ACETAMINOPHEN 10 MG/ML IV SOLN
INTRAVENOUS | Status: AC
  Filled 2017-03-28: qty 100

## 2017-03-28 MED ORDER — FLUOXETINE HCL 20 MG PO CAPS
20.0000 mg | ORAL_CAPSULE | Freq: Every day | ORAL | Status: DC
  Administered 2017-03-29: 20 mg via ORAL
  Filled 2017-03-28: qty 1

## 2017-03-28 MED ORDER — METOCLOPRAMIDE HCL 5 MG PO TABS: 5.0000 mg | ORAL_TABLET | Freq: Three times a day (TID) | ORAL | Status: DC | PRN

## 2017-03-28 MED ORDER — HYDROMORPHONE HCL 1 MG/ML IJ SOLN: 0.2500 mg | INTRAMUSCULAR | Status: DC | PRN

## 2017-03-28 MED ORDER — ALPRAZOLAM 0.5 MG PO TABS
0.5000 mg | ORAL_TABLET | Freq: Every day | ORAL | Status: DC
  Administered 2017-03-28: 0.5 mg via ORAL
  Filled 2017-03-28: qty 1

## 2017-03-28 SURGICAL SUPPLY — 46 items
BAG SPEC THK2 15X12 ZIP CLS (MISCELLANEOUS) ×1
BAG ZIPLOCK 12X15 (MISCELLANEOUS) ×1 IMPLANT
BANDAGE ACE 6X5 VEL STRL LF (GAUZE/BANDAGES/DRESSINGS) ×2 IMPLANT
BLADE SAW RECIPROCATING 77.5 (BLADE) ×2 IMPLANT
BLADE SAW SGTL 13.0X1.19X90.0M (BLADE) ×2 IMPLANT
BOWL SMART MIX CTS (DISPOSABLE) ×2 IMPLANT
BUR OVAL CARBIDE 4.0 (BURR) ×2 IMPLANT
CAPT KNEE PARTIAL 2 ×2 IMPLANT
CEMENT HV SMART SET (Cement) ×2 IMPLANT
CLOTH BEACON ORANGE TIMEOUT ST (SAFETY) ×2 IMPLANT
COVER SURGICAL LIGHT HANDLE (MISCELLANEOUS) ×2 IMPLANT
CUFF TOURN SGL QUICK 34 (TOURNIQUET CUFF) ×2
CUFF TRNQT CYL 34X4X40X1 (TOURNIQUET CUFF) ×1 IMPLANT
DRSG ADAPTIC 3X8 NADH LF (GAUZE/BANDAGES/DRESSINGS) ×2 IMPLANT
DRSG PAD ABDOMINAL 8X10 ST (GAUZE/BANDAGES/DRESSINGS) ×2 IMPLANT
DURAPREP 26ML APPLICATOR (WOUND CARE) ×2 IMPLANT
ELECT REM PT RETURN 15FT ADLT (MISCELLANEOUS) ×2 IMPLANT
EVACUATOR 1/8 PVC DRAIN (DRAIN) ×2 IMPLANT
GAUZE SPONGE 4X4 12PLY STRL (GAUZE/BANDAGES/DRESSINGS) ×2 IMPLANT
GLOVE BIO SURGEON STRL SZ7.5 (GLOVE) ×2 IMPLANT
GLOVE BIO SURGEON STRL SZ8 (GLOVE) ×2 IMPLANT
GLOVE BIOGEL PI IND STRL 7.5 (GLOVE) IMPLANT
GLOVE BIOGEL PI IND STRL 8 (GLOVE) ×2 IMPLANT
GLOVE BIOGEL PI INDICATOR 7.5 (GLOVE) ×5
GLOVE BIOGEL PI INDICATOR 8 (GLOVE) ×2
GLOVE SURG SS PI 7.5 STRL IVOR (GLOVE) ×1 IMPLANT
GOWN SPEC L3 XXLG W/TWL (GOWN DISPOSABLE) ×1 IMPLANT
GOWN STRL REUS W/TWL LRG LVL3 (GOWN DISPOSABLE) ×2 IMPLANT
GOWN STRL REUS W/TWL XL LVL3 (GOWN DISPOSABLE) ×3 IMPLANT
HANDPIECE INTERPULSE COAX TIP (DISPOSABLE) ×2
IMMOBILIZER KNEE 20 (SOFTGOODS) ×2
IMMOBILIZER KNEE 20 THIGH 36 (SOFTGOODS) ×1 IMPLANT
KIT IMPL STRL TIB IPOLY IUNI IMPLANT
MANIFOLD NEPTUNE II (INSTRUMENTS) ×2 IMPLANT
PACK TOTAL KNEE CUSTOM (KITS) ×2 IMPLANT
PAD ABD 8X10 STRL (GAUZE/BANDAGES/DRESSINGS) ×1 IMPLANT
PADDING CAST COTTON 6X4 STRL (CAST SUPPLIES) ×5 IMPLANT
POSITIONER SURGICAL ARM (MISCELLANEOUS) ×2 IMPLANT
SET HNDPC FAN SPRY TIP SCT (DISPOSABLE) ×1 IMPLANT
STRIP CLOSURE SKIN 1/2X4 (GAUZE/BANDAGES/DRESSINGS) ×4 IMPLANT
SUT MNCRL AB 4-0 PS2 18 (SUTURE) ×2 IMPLANT
SUT VIC AB 2-0 CT1 27 (SUTURE) ×4
SUT VIC AB 2-0 CT1 TAPERPNT 27 (SUTURE) ×2 IMPLANT
SUT VLOC 180 0 24IN GS25 (SUTURE) ×2 IMPLANT
SYR 50ML LL SCALE MARK (SYRINGE) ×2 IMPLANT
WRAP KNEE MAXI GEL POST OP (GAUZE/BANDAGES/DRESSINGS) ×1 IMPLANT

## 2017-03-28 NOTE — Anesthesia Preprocedure Evaluation (Signed)
Anesthesia Evaluation  Patient identified by MRN, date of birth, ID band Patient awake    Reviewed: Allergy & Precautions, NPO status , Patient's Chart, lab work & pertinent test results  Airway Mallampati: II  TM Distance: >3 FB Neck ROM: Full    Dental no notable dental hx.    Pulmonary neg pulmonary ROS,    Pulmonary exam normal breath sounds clear to auscultation       Cardiovascular negative cardio ROS Normal cardiovascular exam Rhythm:Regular Rate:Normal     Neuro/Psych negative neurological ROS  negative psych ROS   GI/Hepatic negative GI ROS, Neg liver ROS,   Endo/Other  negative endocrine ROS  Renal/GU negative Renal ROS  negative genitourinary   Musculoskeletal negative musculoskeletal ROS (+)   Abdominal   Peds negative pediatric ROS (+)  Hematology negative hematology ROS (+)   Anesthesia Other Findings   Reproductive/Obstetrics negative OB ROS                             Anesthesia Physical Anesthesia Plan  ASA: II  Anesthesia Plan: Spinal   Post-op Pain Management:  Regional for Post-op pain   Induction: Intravenous  Airway Management Planned: Simple Face Mask  Additional Equipment:   Intra-op Plan:   Post-operative Plan:   Informed Consent: I have reviewed the patients History and Physical, chart, labs and discussed the procedure including the risks, benefits and alternatives for the proposed anesthesia with the patient or authorized representative who has indicated his/her understanding and acceptance.   Dental advisory given  Plan Discussed with: CRNA and Surgeon  Anesthesia Plan Comments:         Anesthesia Quick Evaluation

## 2017-03-28 NOTE — Transfer of Care (Signed)
Immediate Anesthesia Transfer of Care Note  Patient: Lisa Oconnell  Procedure(s) Performed: Procedure(s) with comments: UNICOMPARTMENTAL RIGHT  KNEE (Right) - Adductor Block  Patient Location: PACU  Anesthesia Type:Spinal  Level of Consciousness:  sedated, patient cooperative and responds to stimulation  Airway & Oxygen Therapy:Patient Spontanous Breathing and Patient connected to face mask oxgen  Post-op Assessment:  Report given to PACU RN and Post -op Vital signs reviewed and stable  Post vital signs:  Reviewed and stable  Last Vitals:  Vitals:   03/28/17 1420 03/28/17 1425  BP: 111/71 110/71  Pulse: (!) 49 (!) 50  Resp: (!) 6 (!) 9  Temp:      Complications: No apparent anesthesia complications

## 2017-03-28 NOTE — H&P (View-Only) (Signed)
Lisa Oconnell DOB: 1941/03/21 Married / Language: English / Race: White Female Date of Admission:  03/28/17 CC:  Right Knee pain History of Present Illness The patient is a 76 year old female who comes in  for a preoperative History and Physical. The patient is scheduled for a right unicompartmental knee arthroplasty to be performed by Dr. Dione Plover. Aluisio, MD at [facility] on 03-28-2017. The patient is a 76 year old female who presented for follow up of their knee. The patient is being followed for their right knee pain and osteoarthritis. They are now months out from cortisone injection. Symptoms reported include: pain, swelling, aching and difficulty ambulating. The patient feels that they are doing poorly and report their pain level to be mild to moderate (varies based on activity). The following medication has been used for pain control: antiinflammatory medication (Aleve). The patient presents today following MRI. The patient has not gotten any relief of their symptoms with bracing or Cortisone injections. She continues to have the significant medial side discomfort in the right knee. We obtained the MRI to see if this is potentially recurrent tear or whether she had more advanced degenerative change in medial compartment the plain films showed. She has had cortisone and visco supplement injections without benefit. She had the arthroscopy which helped for a short amount of time. She is now ready to get her knee fixed. They have been treated conservatively in the past for the above stated problem and despite conservative measures, they continue to have progressive pain and severe functional limitations and dysfunction. They have failed non-operative management including home exercise, medications, and injections. It is felt that they would benefit from undergoing total joint replacement. Risks and benefits of the procedure have been discussed with the patient and they elect to proceed with  surgery. There are no active contraindications to surgery such as ongoing infection or rapidly progressive neurological disease.  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 Mouth Blisters  Family History Depression  sister Diabetes Mellitus  mother, sister and brother  Social History Alcohol use  current drinker; 5-7 per week Tobacco use  never smoker Children  2 Current work status  retired Engineer, agricultural (Currently)  no Drug/Alcohol Rehab (Previously)  no Exercise  Exercises weekly; does other Illicit drug use  no Living situation  live with spouse Marital status  married Number of flights of stairs before winded  2-3 Pain Contract  no Advance Directives  Living Will, Healthcare POA  Medication History PROzac (Oral) Specific strength unknown - Active. Xanax (Oral) Specific strength unknown - Active. Biotin (Oral) Specific strength unknown - Active. Aleve (Oral) Specific strength unknown - Active.  Past Surgical History Breast Biopsy  right Cataract Surgery  bilateral Colon Polyp Removal - Open  Foot Surgery  left Other Orthopaedic Surgery  Tubal Ligation   Review of Systems  General Not Present- Chills, Fatigue, Fever, Memory Loss, Night Sweats, Weight Gain and Weight Loss. Skin Not Present- Eczema, Hives, Itching, Lesions and Rash. HEENT Not Present- Dentures, Double Vision, Headache, Hearing Loss, Tinnitus and Visual Loss. Respiratory Not Present- Allergies, Chronic Cough, Coughing up blood, Shortness of breath at  rest and Shortness of  breath with exertion. Cardiovascular Not Present- Chest Pain, Difficulty Breathing Lying Down, Murmur, Palpitations, Racing/skipping heartbeats and Swelling. Gastrointestinal Not Present- Abdominal Pain, Bloody Stool, Constipation, Diarrhea, Difficulty Swallowing, Heartburn, Jaundice, Loss of appetitie, Nausea and Vomiting. Female Genitourinary Not Present- Blood in Urine, Discharge, Flank Pain, Incontinence, Painful Urination, Urgency, Urinary frequency, Urinary Retention, Urinating at Night and Weak urinary stream. Musculoskeletal Present- Back Pain and Joint Pain. Not Present- Joint Swelling, Morning Stiffness, Muscle Pain, Muscle Weakness and Spasms. Neurological Not Present- Blackout spells, Difficulty with balance, Dizziness, Paralysis, Tremor and Weakness. Psychiatric Not Present- Insomnia.  Vitals  Weight: 128 lb Height: 61in Weight was reported by patient. Height was reported by patient. Body Surface Area: 1.56 m Body Mass Index: 24.19 kg/m  Pulse: 58 (Regular)  BP: 116/68 (Sitting, Right Arm, Standard)   Physical Exam General Mental Status -Alert, cooperative and good historian. General Appearance-pleasant, Not in acute distress. Orientation-Oriented X3. Build & Nutrition-Well nourished and Well developed.  Head and Neck Head-normocephalic, atraumatic . Neck Global Assessment - supple, no bruit auscultated on the right, no bruit auscultated on the left.  Eye Vision-Wears corrective lenses. Pupil - Bilateral-Regular and Round. Motion - Bilateral-EOMI.  Chest and Lung Exam Auscultation Breath sounds - clear at anterior chest wall and clear at posterior chest wall. Adventitious sounds - No Adventitious sounds.  Cardiovascular Auscultation Rhythm - Regular rate and rhythm. Heart Sounds - S1 WNL and S2 WNL. Murmurs & Other Heart Sounds - Auscultation of the heart reveals - No Murmurs.  Abdomen Palpation/Percussion Tenderness - Abdomen is  non-tender to palpation. Rigidity (guarding) - Abdomen is soft. Auscultation Auscultation of the abdomen reveals - Bowel sounds normal.  Female Genitourinary Note: Not done, not pertinent to present illness   Musculoskeletal Note: On exam, she is in no distress. Her right hip has normal range of motion with no discomfort. Her right knee shows no effusion. Her range is about 0 to 135. She does have some significant medial tenderness. There is no lateral tenderness. There is no instability noted.  We reviewed the MRI scan. She does have pretty significant degenerative change in the medial compartment with a lot of subchondral cystic formation in the femur as well as tibia. She does not have a recurrent tear. She has a relatively normal looking patellofemoral and lateral compartment.   Assessment & Plan Primary osteoarthritis of right knee (Principal Diagnosis) (M17.11)  Note:Surgical Plans: Right Uni Knee Replacement  Disposition: Home  PCP: Dr. Brigitte Pulse - Patient has been seen preoperatively and felt to be stable for surgery.  IV TXA  Anesthesia Issues:None  Patient was instructed on what medications to stop prior to surgery.  Signed electronically by Joelene Millin, III PA-C

## 2017-03-28 NOTE — Op Note (Signed)
OPERATIVE REPORT-UNICOMPARTMENTAL ARTHROPLASTY  PREOPERATIVE DIAGNOSIS: Medial compartment osteoarthritis, Right knee  POSTOPERATIVE DIAGNOSIS: Medial compartment osteoarthritis, Right knee  PROCEDURE:Right knee medial unicompartmental arthroplasty.   SURGEON: Gaynelle Arabian, MD   ASSISTANT: Arlee Muslim, PA-C  ANESTHESIA:  Adductor canal block and spinal.   ESTIMATED BLOOD LOSS: Minimal.   DRAINS: Hemovac x1.   TOURNIQUET TIME:   Total Tourniquet Time Documented: Thigh (Right) - 30 minutes Total: Thigh (Right) - 30 minutes    COMPLICATIONS: None.   CONDITION: Stable to recovery.   BRIEF CLINICAL NOTE:Lisa Oconnell is a 76 y.o. female, who has  significant isolated medial compartment arthritis of the Right knee. The patient has had nonoperative management including injections of cortisone and viscous supplements. Unfortunately, the pain persists.  Radiograph showed isolated medial compartment bone-on-bone arthritis  with normal-appearing patellofemoral and lateral compartments. The patient presents now for left knee unicompartmental arthroplasty.   PROCEDURE IN DETAIL: After successful administration of  Adductor canal block and spinal anesthetic, a tourniquet was placed high on the  Right thigh and the Right lower extremity prepped and draped in usual sterile fashion. Extremity was wrapped in an Esmarch, knee flexed, and tourniquet inflated to 300 mmHg.       A midline incision was made with a 10 blade through subcutaneous  tissue to the extensor mechanism. A fresh blade was used to make a  medial parapatellar arthrotomy. Soft tissue on the proximal medial  tibia subperiosteally elevated to the joint line with a knife and into  the semimembranosus bursa with a Cobb elevator. The patella was  subluxed laterally, and the knee flexed 90 degrees. The ACL was intact.  The marginal osteophytes on the medial femur and tibia were removed with  a rongeur. The medial  meniscus was also removed. The femoral cutting  block for the conformis unicompartmental knee system was placed along  the femur. There was excellent fit. I traced the outline. We then  removed any remaining cartilage within this outline. We then placed the  cutting block again and pinned in position. The posterior femoral cut  was made, it was approximately 5 mm. The lug holes for the femoral  component were then drilled through the cutting block. The cutting  block was subsequently removed. We then utilized the high speed burr to  create a small trough at the superior aspect of the component tomake it inset and would not overhang the cartilage. The trial was placed,  it had excellent fit. The trial was subsequently removed.       The trial was placed again and the B chip was placed. There was  excellent balance throughout full motion. Also with excellent fit on  the tibia. This was removed as was the femoral trial. A curette was  used to remove any remaining cartilage from the tibia. The tibial  cutting block was then placed and there was a perfect fit on the tibial  surface. The appropriate slope was placed and it was pinned in  position. The reciprocating saw was used to make the central cut and  then the oscillating saw used to make the horizontal cut. The bone  fragment was then removed. The tibial trial was placed and had perfect  fit on the tibia. We then drilled the 2 lug holes and did the keel punch.  We then placed tibia trial and femur trial, and a 6 mm trial insert. There was  excellent stability throughout full range of motion and no impingement.  The trial  was then removed. We drilled small holes in the distal  femur in order to create more conduits for the cement. The cut bone  surfaces were thoroughly irrigated with pulsatile lavage while the  cement was mixed on the back table. We then cemented the tibial  component into place, impacted it and removed the extruded cement.  The  same was done for the femoral component. Trial 6-mm inserts placed,  knee held in full extension, and all extruded cement removed. While the  cement was hardening, I injected the extensor mechanism, periosteum of  the femur and subcu tissues, a total of 20 mL of Exparel mixed with 30  mL of saline and then did an additional injection of 20 mL of 0.25%  Marcaine into the same tissues. When the cement had fully hardened,  then the permanent polyethylene was placed in tibial tray. There was  excellent stability throughout full range of motion with no lift off the  component and no evidence of any impingement.       Wound was copiously irrigated with saline solution, and the arthrotomy closed over a Hemovac drain with a running #1 V-Loc suture. The subcutaneous was closed with  interrupted 2-0 Vicryl and subcuticular running 4-0 Monocryl. The drain  was hooked to suction. Incision cleaned and dried and Steri-Strips and  a bulky sterile dressing applied. The tourniquet was released after a  total time of 30 minutes. This was done after closing the extensor  mechanism. The wound was closed and a bulky sterile dressing was  applied. The operative limb was placed into a knee immobilizer, and the patient awakened and transported to recovery room in stable condition.       Please note that a surgical assistant was a medical necessity for this  procedure in order to perform it in a safe and expeditious manner.  Assistance was necessary for retracting vital ligaments, neurovascular  structures, as well as for proper positioning of the limb to allow for  appropriate bone cuts and appropriate placement of the prosthesis.    Dione Plover Dellamae Rosamilia, MD

## 2017-03-28 NOTE — Interval H&P Note (Signed)
History and Physical Interval Note:  03/28/2017 1:35 PM  Lisa Oconnell  has presented today for surgery, with the diagnosis of Osteoarthritis Right knee medial compartment  The various methods of treatment have been discussed with the patient and family. After consideration of risks, benefits and other options for treatment, the patient has consented to  Procedure(s): UNICOMPARTMENTAL RIGHT  KNEE (Right) as a surgical intervention .  The patient's history has been reviewed, patient examined, no change in status, stable for surgery.  I have reviewed the patient's chart and labs.  Questions were answered to the patient's satisfaction.     Gearlean Alf

## 2017-03-28 NOTE — Progress Notes (Signed)
Assisted Dr. Rose with right, ultrasound guided, adductor canal block. Side rails up, monitors on throughout procedure. See vital signs in flow sheet. Tolerated Procedure well.  

## 2017-03-28 NOTE — Anesthesia Procedure Notes (Signed)
Spinal  Patient location during procedure: OR Start time: 03/28/2017 2:51 PM End time: 03/28/2017 2:57 PM Reason for block: at surgeon's request Staffing Resident/CRNA: Anne Fu Performed: resident/CRNA  Preanesthetic Checklist Completed: patient identified, site marked, surgical consent, pre-op evaluation, timeout performed, IV checked, risks and benefits discussed and monitors and equipment checked Spinal Block Patient position: sitting Prep: DuraPrep Patient monitoring: heart rate, continuous pulse ox and blood pressure Approach: midline Location: L2-3 Injection technique: single-shot Needle Needle type: Pencan  Needle gauge: 24 G Needle length: 9 cm Assessment Sensory level: T6 Additional Notes Expiration date of kit checked and confirmed. Patient tolerated procedure well, without complications. X 2 attempt with noted clear CSF return. Loss of motor and sensory on exam post injection.

## 2017-03-28 NOTE — Anesthesia Postprocedure Evaluation (Signed)
Anesthesia Post Note  Patient: Lisa Oconnell  Procedure(s) Performed: Procedure(s) (LRB): UNICOMPARTMENTAL RIGHT  KNEE (Right)  Patient location during evaluation: PACU Anesthesia Type: Spinal Level of consciousness: awake Vital Signs Assessment: post-procedure vital signs reviewed and stable Respiratory status: spontaneous breathing Anesthetic complications: no       Last Vitals:  Vitals:   03/28/17 1645 03/28/17 1700  BP: (!) 142/72   Pulse: (!) 58   Resp: 11 15  Temp:  36.6 C    Last Pain:  Vitals:   03/28/17 1315  TempSrc: Oral    LLE Motor Response: Purposeful movement (03/28/17 1700) LLE Sensation: Full sensation (03/28/17 1700) RLE Motor Response: Purposeful movement (03/28/17 1700) RLE Sensation: Full sensation (03/28/17 1700) L Sensory Level: S1-Sole of foot, small toes (03/28/17 1700) R Sensory Level: L5-Outer lower leg, top of foot, great toe (03/28/17 1700)  Leonardo Plaia

## 2017-03-28 NOTE — Anesthesia Procedure Notes (Signed)
Anesthesia Regional Block: Adductor canal block   Pre-Anesthetic Checklist: ,, timeout performed, Correct Patient, Correct Site, Correct Laterality, Correct Procedure, Correct Position, site marked, Risks and benefits discussed,  Surgical consent,  Pre-op evaluation,  At surgeon's request and post-op pain management  Laterality: Right  Prep: chloraprep       Needles:  Injection technique: Single-shot  Needle Type: Echogenic Needle     Needle Length: 9cm      Additional Needles:   Procedures: ultrasound guided,,,,,,,,  Narrative:  Start time: 03/28/2017 1:51 PM End time: 03/28/2017 2:02 PM Injection made incrementally with aspirations every 5 mL.  Performed by: Personally  Anesthesiologist: Katonya Blecher  Additional Notes: Patient tolerated the procedure well without complications

## 2017-03-29 ENCOUNTER — Encounter (HOSPITAL_COMMUNITY): Payer: Self-pay | Admitting: Orthopedic Surgery

## 2017-03-29 DIAGNOSIS — Z79899 Other long term (current) drug therapy: Secondary | ICD-10-CM | POA: Diagnosis not present

## 2017-03-29 DIAGNOSIS — F419 Anxiety disorder, unspecified: Secondary | ICD-10-CM | POA: Diagnosis not present

## 2017-03-29 DIAGNOSIS — E785 Hyperlipidemia, unspecified: Secondary | ICD-10-CM | POA: Diagnosis not present

## 2017-03-29 DIAGNOSIS — M1711 Unilateral primary osteoarthritis, right knee: Secondary | ICD-10-CM | POA: Diagnosis not present

## 2017-03-29 LAB — CBC
HCT: 35 % — ABNORMAL LOW (ref 36.0–46.0)
Hemoglobin: 11.6 g/dL — ABNORMAL LOW (ref 12.0–15.0)
MCH: 30.2 pg (ref 26.0–34.0)
MCHC: 33.1 g/dL (ref 30.0–36.0)
MCV: 91.1 fL (ref 78.0–100.0)
Platelets: 304 10*3/uL (ref 150–400)
RBC: 3.84 MIL/uL — ABNORMAL LOW (ref 3.87–5.11)
RDW: 13.3 % (ref 11.5–15.5)
WBC: 8.9 10*3/uL (ref 4.0–10.5)

## 2017-03-29 LAB — BASIC METABOLIC PANEL
Anion gap: 9 (ref 5–15)
BUN: 9 mg/dL (ref 6–20)
CALCIUM: 8.9 mg/dL (ref 8.9–10.3)
CO2: 26 mmol/L (ref 22–32)
CREATININE: 0.82 mg/dL (ref 0.44–1.00)
Chloride: 103 mmol/L (ref 101–111)
GFR calc non Af Amer: >60 mL/min (ref >60)
Glucose, Bld: 142 mg/dL — ABNORMAL HIGH (ref 65–99)
Potassium: 4.1 mmol/L (ref 3.5–5.1)
SODIUM: 138 mmol/L (ref 135–145)

## 2017-03-29 MED ORDER — RIVAROXABAN 10 MG PO TABS: 10.0000 mg | ORAL_TABLET | Freq: Every day | ORAL | 0 refills | Status: DC

## 2017-03-29 MED ORDER — OXYCODONE HCL 5 MG PO TABS: 5.0000 mg | ORAL_TABLET | ORAL | 0 refills | Status: DC | PRN

## 2017-03-29 MED ORDER — METHOCARBAMOL 500 MG PO TABS: 500.0000 mg | ORAL_TABLET | Freq: Four times a day (QID) | ORAL | 1 refills | Status: DC | PRN

## 2017-03-29 NOTE — Progress Notes (Signed)
Physical Therapy Treatment Patient Details Name: Lisa Oconnell MRN: [id number] DOB: 09-08-1941 Today's Date: 03/29/2017    History of Present Illness s/p R uni knee    PT Comments    Ready for DC.   Follow Up Recommendations  Home health PT     Equipment Recommendations  None recommended by PT    Recommendations for Other Services       Precautions / Restrictions Precautions Precautions: Fall Precaution Comments: pt is not using KI;  Restrictions Weight Bearing Restrictions: No    Mobility  Bed Mobility Overal bed mobility: Modified Independent Bed Mobility: Supine to Sit     Supine to sit:     General bed mobility comments:independent Transfers Overall transfer level: Modified independent Equipment used: Rolling walker (2 wheeled) Transfers: Sit to/from Stand Sit to Stand: Supervision         General transfer comment: cues for hand placement  Ambulation/Gait Ambulation/Gait assistance: Supervision Ambulation Distance (Feet): 100 Feet Assistive device: Rolling walker (2 wheeled) Gait Pattern/deviations: Step-through pattern     General Gait Details: cues for sequence   Stairs Stairs: Yes   Stair Management: One rail Left;Step to pattern;Forwards;With cane Number of Stairs: 2 General stair comments: cues for sequence  Wheelchair Mobility    Modified Rankin (Stroke Patients Only)       Balance                                            Cognition Arousal/Alertness: Awake/alert Behavior During Therapy: WFL for tasks assessed/performed Overall Cognitive Status: Within Functional Limits for tasks assessed                                        Exercises    General Comments        Pertinent Vitals/Pain Pain Assessment: 0-10 Pain Score: 5  Pain Location: R knee and thigh Pain Descriptors / Indicators: Sore Pain Intervention(s): Monitored during session    Home Living Family/patient expects to  be discharged to:: Private residence Living Arrangements: Spouse/significant other             Additional Comments: can borrow a shower seat    Prior Function Level of Independence: Independent          PT Goals (current goals can now be found in the care plan section) Acute Rehab PT Goals Patient Stated Goal: home PT Goal Formulation: With patient/family Time For Goal Achievement: 03/30/17 Potential to Achieve Goals: Good Progress towards PT goals: Progressing toward goals    Frequency    7X/week      PT Plan Current plan remains appropriate    Co-evaluation              AM-PAC PT "6 Clicks" Daily Activity  Outcome Measure  Difficulty turning over in bed (including adjusting bedclothes, sheets and blankets)?: None Difficulty moving from lying on back to sitting on the side of the bed? : None Difficulty sitting down on and standing up from a chair with arms (e.g., wheelchair, bedside commode, etc,.)?: None Help needed moving to and from a bed to chair (including a wheelchair)?: None Help needed walking in hospital room?: A Little Help needed climbing 3-5 steps with a railing? : A Little 6 Click Score: 22    End  of Session   Activity Tolerance: Patient tolerated treatment well Patient left: in bed;with call bell/phone within reach Nurse Communication: Mobility status PT Visit Diagnosis: Difficulty in walking, not elsewhere classified (R26.2);Pain Pain - Right/Left: Right Pain - part of body: Knee     Time: 1145-1200 PT Time Calculation (min) (ACUTE ONLY): 15 min  Charges:  $Gait Training: 8-22 mins $Therapeutic Exercise: 8-22 mins                    G Codes:  Functional Assessment Tool Used: AM-PAC 6 Clicks Basic Mobility;Clinical judgement Functional Limitation: Mobility: Walking and moving around Mobility: Walking and Moving Around Current Status (V3612): At least 20 percent but less than 40 percent impaired, limited or restricted Mobility:  Walking and Moving Around Goal Status [phone]): At least 1 percent but less than 20 percent impaired, limited or restricted    [facility] PT [phone]    Claretha Cooper 03/29/2017, 12:36 PM

## 2017-03-29 NOTE — Discharge Instructions (Signed)
Dr. Gaynelle Arabian Total Joint Specialist [facility] [address], [place], [place] [address] [phone]  UNI KNEE REPLACEMENT POSTOPERATIVE DIRECTIONS   Knee Rehabilitation, Guidelines Following Surgery  Results after knee surgery are often greatly improved when you follow the exercise, range of motion and muscle strengthening exercises prescribed by your doctor. Safety measures are also important to protect the knee from further injury. Any time any of these exercises cause you to have increased pain or swelling in your knee joint, decrease the amount until you are comfortable again and slowly increase them. If you have problems or questions, call your caregiver or physical therapist for advice.   HOME CARE INSTRUCTIONS  Remove items at home which could result in a fall. This includes throw rugs or furniture in walking pathways.   ICE to the affected knee every three hours for 30 minutes at a time and then as needed for pain and swelling.  Continue to use ice on the knee for pain and swelling from surgery. You may notice swelling that will progress down to the foot and ankle.  This is normal after surgery.  Elevate the leg when you are not up walking on it.    Continue to use the breathing machine which will help keep your temperature down.  It is common for your temperature to cycle up and down following surgery, especially at night when you are not up moving around and exerting yourself.  The breathing machine keeps your lungs expanded and your temperature down.  Do not place pillow under knee, focus on keeping the knee straight while resting  DIET You may resume your previous home diet once your are discharged from the hospital.  DRESSING / WOUND CARE / SHOWERING You may start showering once you are discharged home but do not submerge the incision under water. Just pat the incision dry and apply a dry gauze dressing on daily. Change the surgical dressing  daily and reapply a dry dressing each time.  ACTIVITY Walk with your walker as instructed. Use walker as long as suggested by your caregivers. Avoid periods of inactivity such as sitting longer than an hour when not asleep. This helps prevent blood clots.  You may resume a sexual relationship in one month or when given the OK by your doctor.  You may return to work once you are cleared by your doctor.  Do not drive a car for 6 weeks or until released by you surgeon.  Do not drive while taking narcotics.  WEIGHT BEARING Weight bearing as tolerated with assist device (walker, cane, etc) as directed, use it as long as suggested by your surgeon or therapist, typically at least 4-6 weeks.  POSTOPERATIVE CONSTIPATION PROTOCOL Constipation - defined medically as fewer than three stools per week and severe constipation as less than one stool per week.  One of the most common issues patients have following surgery is constipation.  Even if you have a regular bowel pattern at home, your normal regimen is likely to be disrupted due to multiple reasons following surgery.  Combination of anesthesia, postoperative narcotics, change in appetite and fluid intake all can affect your bowels.  In order to avoid complications following surgery, here are some recommendations in order to help you during your recovery period.  Colace (docusate) - Pick up an over-the-counter form of Colace or another stool softener and take twice a day as long as you are requiring postoperative pain medications.  Take with a full glass of  water daily.  If you experience loose stools or diarrhea, hold the colace until you stool forms back up.  If your symptoms do not get better within 1 week or if they get worse, check with your doctor.  Dulcolax (bisacodyl) - Pick up over-the-counter and take as directed by the product packaging as needed to assist with the movement of your bowels.  Take with a full glass of water.  Use this product as  needed if not relieved by Colace only.   MiraLax (polyethylene glycol) - Pick up over-the-counter to have on hand.  MiraLax is a solution that will increase the amount of water in your bowels to assist with bowel movements.  Take as directed and can mix with a glass of water, juice, soda, coffee, or tea.  Take if you go more than two days without a movement. Do not use MiraLax more than once per day. Call your doctor if you are still constipated or irregular after using this medication for 7 days in a row.  If you continue to have problems with postoperative constipation, please contact the office for further assistance and recommendations.  If you experience "the worst abdominal pain ever" or develop nausea or vomiting, please contact the office immediatly for further recommendations for treatment.  ITCHING  If you experience itching with your medications, try taking only a single pain pill, or even half a pain pill at a time.  You can also use Benadryl over the counter for itching or also to help with sleep.   TED HOSE STOCKINGS Wear the elastic stockings on both legs for three weeks following surgery during the day but you may remove then at night for sleeping.  MEDICATIONS See your medication summary on the After Visit Summary that the nursing staff will review with you prior to discharge.  You may have some home medications which will be placed on hold until you complete the course of blood thinner medication.  It is important for you to complete the blood thinner medication as prescribed by your surgeon.  Continue your approved medications as instructed at time of discharge.  PRECAUTIONS If you experience chest pain or shortness of breath - call 911 immediately for transfer to the hospital emergency department.  If you develop a fever greater that 101 F, purulent drainage from wound, increased redness or drainage from wound, foul odor from the wound/dressing, or calf pain - CONTACT YOUR  SURGEON.                                                   FOLLOW-UP APPOINTMENTS Make sure you keep all of your appointments after your operation with your surgeon and caregivers. You should call the office at the above phone number and make an appointment for approximately two weeks after the date of your surgery or on the date instructed by your surgeon outlined in the "After Visit Summary".  RANGE OF MOTION AND STRENGTHENING EXERCISES  Rehabilitation of the knee is important following a knee injury or an operation. After just a few days of immobilization, the muscles of the thigh which control the knee become weakened and shrink (atrophy). Knee exercises are designed to build up the tone and strength of the thigh muscles and to improve knee motion. Often times heat used for twenty to thirty minutes before working out will loosen  up your tissues and help with improving the range of motion but do not use heat for the first two weeks following surgery. These exercises can be done on a training (exercise) mat, on the floor, on a table or on a bed. Use what ever works the best and is most comfortable for you Knee exercises include:  Leg Lifts - While your knee is still immobilized in a splint or cast, you can do straight leg raises. Lift the leg to 60 degrees, hold for 3 sec, and slowly lower the leg. Repeat 10-20 times 2-3 times daily. Perform this exercise against resistance later as your knee gets better.  Quad and Hamstring Sets - Tighten up the muscle on the front of the thigh (Quad) and hold for 5-10 sec. Repeat this 10-20 times hourly. Hamstring sets are done by pushing the foot backward against an object and holding for 5-10 sec. Repeat as with quad sets.   Leg Slides: Lying on your back, slowly slide your foot toward your buttocks, bending your knee up off the floor (only go as far as is comfortable). Then slowly slide your foot back down until your leg is flat on the floor again.  Angel Wings:  Lying on your back spread your legs to the side as far apart as you can without causing discomfort.  A rehabilitation program following serious knee injuries can speed recovery and prevent re-injury in the future due to weakened muscles. Contact your doctor or a physical therapist for more information on knee rehabilitation.   IF YOU ARE TRANSFERRED TO A SKILLED REHAB FACILITY If the patient is transferred to a skilled rehab facility following release from the hospital, a list of the current medications will be sent to the facility for the patient to continue.  When discharged from the skilled rehab facility, please have the facility set up the patient's [place] prior to being released. Also, the skilled facility will be responsible for providing the patient with their medications at time of release from the facility to include their pain medication, the muscle relaxants, and their blood thinner medication. If the patient is still at the rehab facility at time of the two week follow up appointment, the skilled rehab facility will also need to assist the patient in arranging follow up appointment in our office and any transportation needs.  MAKE SURE YOU:  Understand these instructions.  Get help right away if you are not doing well or get worse.    Pick up stool softner and laxative for home use following surgery while on pain medications. Do not submerge incision under water. Please use good hand washing techniques while changing dressing each day. May shower starting three days after surgery. Please use a clean towel to pat the incision dry following showers. Continue to use ice for pain and swelling after surgery. Do not use any lotions or creams on the incision until instructed by your surgeon.   Take Xarelto 10 mg daily for ten days, then change to Aspirin 325 mg daily for two weeks, then reduce to Baby Aspirin 81 mg daily for three additional  weeks.   =======================================================================================================  Information on my medicine - XARELTO (Rivaroxaban)  Why was Xarelto prescribed for you? Xarelto was prescribed for you to reduce the risk of blood clots forming after orthopedic surgery. The medical term for these abnormal blood clots is venous thromboembolism (VTE).  What do you need to know about xarelto ? Take your Xarelto ONCE DAILY at  the same time every day. You may take it either with or without food.  If you have difficulty swallowing the tablet whole, you may crush it and mix in applesauce just prior to taking your dose.  Take Xarelto exactly as prescribed by your doctor and DO NOT stop taking Xarelto without talking to the doctor who prescribed the medication.  Stopping without other VTE prevention medication to take the place of Xarelto may increase your risk of developing a clot.  After discharge, you should have regular check-up appointments with your healthcare provider that is prescribing your Xarelto.    What do you do if you miss a dose? If you miss a dose, take it as soon as you remember on the same day then continue your regularly scheduled once daily regimen the next day. Do not take two doses of Xarelto on the same day.   Important Safety Information A possible side effect of Xarelto is bleeding. You should call your healthcare provider right away if you experience any of the following: ? Bleeding from an injury or your nose that does not stop. ? Unusual colored urine (red or dark brown) or unusual colored stools (red or black). ? Unusual bruising for unknown reasons. ? A serious fall or if you hit your head (even if there is no bleeding).  Some medicines may interact with Xarelto and might increase your risk of bleeding while on Xarelto. To help avoid this, consult your healthcare provider or pharmacist prior to using any new prescription  or non-prescription medications, including herbals, vitamins, non-steroidal anti-inflammatory drugs (NSAIDs) and supplements.  This website has more information on Xarelto: [email or web address]

## 2017-03-29 NOTE — Evaluation (Signed)
Occupational Therapy Evaluation Patient Details Name: Lisa Oconnell MRN: [id number] DOB: [birth year] Today's Date: 03/29/2017    History of Present Illness s/p R uni knee   Clinical Impression   This 76 year old female was admitted for the above sx. All education was completed. No further OT is needed at this time    Follow Up Recommendations  Supervision/Assistance - 24 hour    Equipment Recommendations  None recommended by OT    Recommendations for Other Services       Precautions / Restrictions Precautions Precautions: Fall Precaution Comments: pt is not using KI; educated that she can use it during the night, if she wants to sleep on her side Restrictions Weight Bearing Restrictions: No      Mobility Bed Mobility               General bed mobility comments: supervision for back to bed  Transfers Overall transfer level: Needs assistance Equipment used: Rolling walker (2 wheeled) Transfers: Sit to/from Stand Sit to Stand: Supervision              Balance                                           ADL either performed or assessed with clinical judgement   ADL Overall ADL's : Needs assistance/impaired     Grooming: Supervision/safety;Standing   Upper Body Bathing: Set up;Sitting   Lower Body Bathing: Minimal assistance;Sit to/from stand   Upper Body Dressing : Set up;Sitting   Lower Body Dressing: Minimal assistance;Sit to/from stand   Toilet Transfer: Min guard;Ambulation;Regular Toilet;RW   Toileting- Water quality scientist and Hygiene: Min guard;Sit to/from stand   Tub/ Shower Transfer: Walk-in shower;Min guard;Ambulation     General ADL Comments: performed the above transfers.  Pt will be able to get up from comfort height commode with RW and seat to push up from.  Educated on shower seat placement and need for walker, if it against far wall.  Husband will assist as needed     Vision         Perception      Praxis      Pertinent Vitals/Pain Pain Assessment: 0-10 Pain Score: 5  Pain Location: R knee Pain Descriptors / Indicators: Sore Pain Intervention(s): Limited activity within patient's tolerance;Monitored during session;Premedicated before session;Repositioned;Ice applied     Hand Dominance     Extremity/Trunk Assessment Upper Extremity Assessment Upper Extremity Assessment: Overall WFL for tasks assessed           Communication Communication Communication: No difficulties   Cognition Arousal/Alertness: Awake/alert Behavior During Therapy: WFL for tasks assessed/performed Overall Cognitive Status: Within Functional Limits for tasks assessed                                     General Comments       Exercises     Shoulder Instructions      Home Living Family/patient expects to be discharged to:: Private residence Living Arrangements: Spouse/significant other                 Bathroom Shower/Tub: Occupational psychologist: Handicapped height         Additional Comments: can borrow a shower seat      Prior Functioning/Environment Level of  Independence: Independent                 OT Problem List:        OT Treatment/Interventions:      OT Goals(Current goals can be found in the care plan section) Acute Rehab OT Goals Patient Stated Goal: home OT Goal Formulation: All assessment and education complete, DC therapy  OT Frequency:     Barriers to D/C:            Co-evaluation              AM-PAC PT "6 Clicks" Daily Activity     Outcome Measure Help from another person eating meals?: None Help from another person taking care of personal grooming?: A Little Help from another person toileting, which includes using toliet, bedpan, or urinal?: A Little Help from another person bathing (including washing, rinsing, drying)?: A Little Help from another person to put on and taking off regular upper body clothing?: A  Little Help from another person to put on and taking off regular lower body clothing?: A Little 6 Click Score: 19   End of Session    Activity Tolerance: Patient tolerated treatment well Patient left: in bed;with call bell/phone within reach;with family/visitor present  OT Visit Diagnosis: Pain Pain - Right/Left: Right Pain - part of body: Knee                Time: 3428-7681 OT Time Calculation (min): 18 min Charges:  OT General Charges $OT Visit: 1 Procedure OT Evaluation $OT Eval Low Complexity: 1 Procedure G-Codes: OT G-codes **NOT FOR INPATIENT CLASS** Functional Assessment Tool Used: Clinical judgement Functional Limitation: Self care Self Care Current Status (L5726): At least 20 percent but less than 40 percent impaired, limited or restricted Self Care Goal Status (O0355): At least 20 percent but less than 40 percent impaired, limited or restricted Self Care Discharge Status [phone]): At least 20 percent but less than 40 percent impaired, limited or restricted   Lisa Oconnell, Lisa Oconnell [phone] 03/29/2017  Lisa Oconnell 03/29/2017, 11:30 AM

## 2017-03-29 NOTE — Progress Notes (Signed)
Discharge planning, spoke with patient and spouse at bedside. Have chosen [facility] for HH PT. Contacted [facility] for referral. Has RW and 3n1. [phone] 

## 2017-03-29 NOTE — Progress Notes (Signed)
   Subjective: 1 Day Post-Op Procedure(s) (LRB): UNICOMPARTMENTAL RIGHT  KNEE (Right) Patient reports pain as mild.   We will start therapy today.  Plan is to go Home after hospital stay.  Objective: Vital signs in last 24 hours: Temp:  [97.5 F (36.4 C)-98.1 F (36.7 C)] 97.8 F (36.6 C) (05/24 0523) Pulse Rate:  [47-98] 62 (05/24 0527) Resp:  [0-18] 16 (05/24 0523) BP: (92-142)/(52-89) 105/69 (05/24 0527) SpO2:  [99 %-100 %] 99 % (05/24 0523) Weight:  [59 kg (130 lb)] 59 kg (130 lb) (05/23 1333)  Intake/Output from previous day:  Intake/Output Summary (Last 24 hours) at 03/29/17 0711 Last data filed at 03/29/17 0600  Gross per 24 hour  Intake           2632.5 ml  Output             1735 ml  Net            897.5 ml    Intake/Output this shift: No intake/output data recorded.  Labs:  Recent Labs  03/29/17 0357  HGB 11.6*    Recent Labs  03/29/17 0357  WBC 8.9  RBC 3.84*  HCT 35.0*  PLT 304    Recent Labs  03/29/17 0357  NA 138  K 4.1  CL 103  CO2 26  BUN 9  CREATININE 0.82  GLUCOSE 142*  CALCIUM 8.9   No results for input(s): LABPT, INR in the last 72 hours.  EXAM General - Patient is Alert, Appropriate and Oriented Extremity - Neurologically intact Neurovascular intact No cellulitis present Compartment soft Dressing - dressing C/D/I Motor Function - intact, moving foot and toes well on exam.  Hemovac pulled without difficulty.  Past Medical History:  Diagnosis Date  . Allergy    year round  . Anxiety   . Arthritis   . Colon polyp 01/2006   adenomatous  . Hyperlipidemia   . Tuberculosis 1966   treated    Assessment/Plan: 1 Day Post-Op Procedure(s) (LRB): UNICOMPARTMENTAL RIGHT  KNEE (Right) Principal Problem:   OA (osteoarthritis) of knee   Advance diet Up with therapy D/C IV fluids Discharge home with home health  DVT Prophylaxis - Xarelto Weight-Bearing as tolerated to right leg   Abbegale Stehle,[place] V 03/29/2017, 7:11  AM

## 2017-03-29 NOTE — Addendum Note (Signed)
Addendum  created 03/29/17 0756 by Lollie Sails, CRNA   Charge Capture section accepted

## 2017-03-29 NOTE — Evaluation (Signed)
Physical Therapy Evaluation Patient Details Name: Lisa Oconnell MRN: [id number] DOB: [birth year] Today's Date: 03/29/2017   History of Present Illness  s/p R uni knee  Clinical Impression  The patient is progressing well. DC after next visit. Pt admitted with above diagnosis. Pt currently with functional limitations due to the deficits listed below (see PT Problem List).  Pt will benefit from skilled PT to increase their independence and safety with mobility to allow discharge to the venue listed below.       Follow Up Recommendations Home health PT    Equipment Recommendations  None recommended by PT    Recommendations for Other Services       Precautions / Restrictions Precautions Precautions: Fall Precaution Comments: pt is not using KI;  Restrictions Weight Bearing Restrictions: No      Mobility  Bed Mobility Overal bed mobility: Needs Assistance Bed Mobility: Supine to Sit     Supine to sit: Supervision     General bed mobility comments: supervision for back to bed  Transfers Overall transfer level: Needs assistance Equipment used: Rolling walker (2 wheeled) Transfers: Sit to/from Stand Sit to Stand: Supervision         General transfer comment: cues for hand placement  Ambulation/Gait   Ambulation Distance (Feet): 200 Feet Assistive device: Rolling walker (2 wheeled) Gait Pattern/deviations: Step-through pattern     General Gait Details: cues for sequence  Stairs            Wheelchair Mobility    Modified Rankin (Stroke Patients Only)       Balance                                             Pertinent Vitals/Pain Pain Assessment: 0-10 Pain Score: 4  Pain Location: R knee and thigh Pain Descriptors / Indicators: Sore Pain Intervention(s): Premedicated before session;Ice applied    Home Living Family/patient expects to be discharged to:: Private residence Living Arrangements: Spouse/significant other                Additional Comments: can borrow a shower seat    Prior Function Level of Independence: Independent               Hand Dominance        Extremity/Trunk Assessment   Upper Extremity Assessment Upper Extremity Assessment: Defer to OT evaluation    Lower Extremity Assessment Lower Extremity Assessment: RLE deficits/detail RLE Deficits / Details: able to perform SLR, knee flexion to 50    Cervical / Trunk Assessment Cervical / Trunk Assessment: Normal  Communication   Communication: No difficulties  Cognition Arousal/Alertness: Awake/alert Behavior During Therapy: WFL for tasks assessed/performed Overall Cognitive Status: Within Functional Limits for tasks assessed                                        General Comments      Exercises Total Joint Exercises Ankle Circles/Pumps: AROM;Both;10 reps Quad Sets: AROM;Both;10 reps Towel Squeeze: AROM;Both;10 reps Heel Slides: AROM;Right;10 reps Hip ABduction/ADduction: AROM;Right;10 reps Straight Leg Raises: AROM;Right;10 reps   Assessment/Plan    PT Assessment Patient needs continued PT services  PT Problem List Decreased strength;Decreased range of motion;Decreased activity tolerance;Decreased mobility;Decreased safety awareness;Decreased knowledge of use of DME;Decreased knowledge of precautions  PT Treatment Interventions Gait training;DME instruction;Stair training;Functional mobility training;Patient/family education;Therapeutic exercise    PT Goals (Current goals can be found in the Care Plan section)  Acute Rehab PT Goals Patient Stated Goal: home PT Goal Formulation: With patient/family Time For Goal Achievement: 03/30/17 Potential to Achieve Goals: Good    Frequency 7X/week   Barriers to discharge        Co-evaluation               AM-PAC PT "6 Clicks" Daily Activity  Outcome Measure Difficulty turning over in bed (including adjusting bedclothes, sheets  and blankets)?: None Difficulty moving from lying on back to sitting on the side of the bed? : None Difficulty sitting down on and standing up from a chair with arms (e.g., wheelchair, bedside commode, etc,.)?: None Help needed moving to and from a bed to chair (including a wheelchair)?: A Little Help needed walking in hospital room?: A Little Help needed climbing 3-5 steps with a railing? : A Little 6 Click Score: 21    End of Session   Activity Tolerance: Patient tolerated treatment well Patient left: in chair;with call bell/phone within reach;with family/visitor present Nurse Communication: Mobility status PT Visit Diagnosis: Difficulty in walking, not elsewhere classified (R26.2);Pain Pain - Right/Left: Right Pain - part of body: Knee    Time: 0952-1029 PT Time Calculation (min) (ACUTE ONLY): 37 min   Charges:   PT Evaluation $PT Eval Low Complexity: 1 Procedure PT Treatments $Gait Training: 8-22 mins   PT G Codes:   PT G-Codes **NOT FOR INPATIENT CLASS** Functional Assessment Tool Used: AM-PAC 6 Clicks Basic Mobility;Clinical judgement Functional Limitation: Mobility: Walking and moving around Mobility: Walking and Moving Around Current Status (B7628): At least 20 percent but less than 40 percent impaired, limited or restricted Mobility: Walking and Moving Around Goal Status [phone]): At least 1 percent but less than 20 percent impaired, limited or restricted    ,[facility] PT [phone]   Claretha Cooper 03/29/2017, 12:31 PM

## 2017-04-10 DIAGNOSIS — Z471 Aftercare following joint replacement surgery: Secondary | ICD-10-CM | POA: Diagnosis not present

## 2017-04-10 DIAGNOSIS — M1711 Unilateral primary osteoarthritis, right knee: Secondary | ICD-10-CM | POA: Diagnosis not present

## 2017-04-10 DIAGNOSIS — Z96651 Presence of right artificial knee joint: Secondary | ICD-10-CM | POA: Diagnosis not present

## 2017-04-11 ENCOUNTER — Ambulatory Visit: Payer: Medicare Other | Attending: Orthopedic Surgery | Admitting: Physical Therapy

## 2017-04-11 ENCOUNTER — Encounter: Payer: Self-pay | Admitting: Physical Therapy

## 2017-04-11 DIAGNOSIS — M25661 Stiffness of right knee, not elsewhere classified: Secondary | ICD-10-CM | POA: Diagnosis not present

## 2017-04-11 DIAGNOSIS — R2241 Localized swelling, mass and lump, right lower limb: Secondary | ICD-10-CM | POA: Diagnosis not present

## 2017-04-11 DIAGNOSIS — M25561 Pain in right knee: Secondary | ICD-10-CM | POA: Diagnosis not present

## 2017-04-11 DIAGNOSIS — R262 Difficulty in walking, not elsewhere classified: Secondary | ICD-10-CM | POA: Insufficient documentation

## 2017-04-11 NOTE — Therapy (Signed)
[place] [address] [place], [place], [address] Phone: [phone]   Fax:  [phone]  Physical Therapy Evaluation  Patient Details  Name: Lisa Oconnell MRN: [id number] Date of Birth: [birth year] Referring Provider: Wynelle Link  Encounter Date: 04/11/2017      PT End of Session - 04/11/17 1431    Visit Number 1   Date for PT Re-Evaluation 06/11/17   PT Start Time 8315   PT Stop Time 1455   PT Time Calculation (min) 52 min   Activity Tolerance Patient tolerated treatment well   Behavior During Therapy [facility] for tasks assessed/performed      Past Medical History:  Diagnosis Date  . Allergy    year round  . Anxiety   . Arthritis   . Colon polyp 01/2006   adenomatous  . Hyperlipidemia   . Tuberculosis 1966   treated    Past Surgical History:  Procedure Laterality Date  . BREAST EXCISIONAL BIOPSY Right   . BUNIONECTOMY     left  . CATARACT EXTRACTION W/ INTRAOCULAR LENS  IMPLANT, BILATERAL  2013  . HAND CONTRACTURE RELEASE    . KNEE ARTHROSCOPY WITH MEDIAL MENISECTOMY Right 04/11/2016   Procedure: KNEE ARTHROSCOPY WITH MEDIAL MENISECTOMY AND CONDROPLASTY;  Surgeon: Gaynelle Arabian, MD;  Location: [facility];  Service: Orthopedics;  Laterality: Right;  . liposuction (15 yrs ago     stomach  . PARTIAL KNEE ARTHROPLASTY Right 03/28/2017   Procedure: UNICOMPARTMENTAL RIGHT  KNEE;  Surgeon: Gaynelle Arabian, MD;  Location: [facility];  Service: Orthopedics;  Laterality: Right;  Adductor Block  . TUBAL LIGATION  1978    There were no vitals filed for this visit.       Subjective Assessment - 04/11/17 1409    Subjective Patient reports that she has had right knee pain for aobut a year and a half.  Reports that she had a knee scope to clean meniscus and it never really helped.  Had a right unilateral knee replacement, 03/28/17.  She reports she has been swollen and some soreness   Limitations Walking;House hold activities    Patient Stated Goals less pain and walk better   Currently in Pain? Yes   Pain Score 1    Pain Location Knee   Pain Orientation Right   Pain Descriptors / Indicators Aching   Pain Type Surgical pain   Pain Onset 1 to 4 weeks ago   Pain Frequency Constant   Aggravating Factors  walking, standing and bending, pain to 5/10   Pain Relieving Factors ice and rest   Effect of Pain on Daily Activities difficulty walking            [facility] PT Assessment - 04/11/17 0001      Assessment   Medical Diagnosis s/p right UKA   Referring Provider Aluisio   Onset Date/Surgical Date 03/28/17   Prior Therapy at home     Precautions   Precautions None     Balance Screen   Has the patient fallen in the past 6 months No   Has the patient had a decrease in activity level because of a fear of falling?  No   Is the patient reluctant to leave their home because of a fear of falling?  No     Home Environment   Additional Comments no stairs, 4 steps into the home, likes to do yardwork and gardening     Prior Function   Level of  Independence Independent   Vocation Retired   Leisure no exercise     Observation/Other Assessments-Edema    Edema Circumferential     Circumferential Edema   Circumferential - Right 43cm mid patella   Circumferential - Left  39.cm     ROM / Strength   AROM / PROM / Strength AROM;PROM;Strength     AROM   AROM Assessment Site Knee   Right/Left Knee Right   Right Knee Extension 11   Right Knee Flexion 96     PROM   PROM Assessment Site Knee   Right/Left Knee Right   Right Knee Extension 5   Right Knee Flexion 104     Strength   Overall Strength Comments 4-/5 with a little pain     Palpation   Palpation comment a lot of swelling, steri strips in place,      Ambulation/Gait   Gait Comments gait is with SPC, antalgic for the first few steps after getting up            Objective measurements completed on examination: See above findings.                   PT Education - 04/11/17 1428    Education provided Yes   Education Details low load long duration   Person(s) Educated Patient   Methods Explanation;Demonstration;Handout   Comprehension Verbalized understanding          PT Short Term Goals - 04/11/17 1440      PT SHORT TERM GOAL #1   Title independent with initial HEP   Time 2   Period Weeks   Status New           PT Long Term Goals - 04/11/17 1440      PT LONG TERM GOAL #1   Title decrease pain 50%   Time 8   Period Weeks   Status New     PT LONG TERM GOAL #2   Title increase AROM to 4-120 degrees flexion   Time 8   Period Weeks   Status New     PT LONG TERM GOAL #3   Title walk all distances without assistive device   Time 8   Period Weeks   Status New     PT LONG TERM GOAL #4   Title ascend and descend stairs step over step   Time 8   Period Weeks   Status New                Plan - 04/11/17 1432    Clinical Impression Statement Patient reports having right knee pain for about a year and a half.  She had a knee scope about a year ago without symptom resolution.  She underwent a unilateral knee arthroplasty on the right knee on 03/28/17.  She has significant swelling today but reports that she has been up and going since 8AM.  Her AROM of the right knee is 11-96 degrees flexion, first few steps are antalgic   Clinical Presentation Evolving   Clinical Decision Making Low   Rehab Potential Good   PT Frequency 2x / week   PT Duration 4 weeks   PT Treatment/Interventions ADLs/Self Care Home Management;Cryotherapy;Associate Professor;Therapeutic activities;Therapeutic exercise;Balance training;Patient/family education;Manual techniques   PT Next Visit Plan slowly add exercises and gait to maximize function and ROM, will need to decrease swelling   Consulted and Agree with Plan of Care Patient      Patient will  benefit from skilled  therapeutic intervention in order to improve the following deficits and impairments:     Visit Diagnosis: Acute pain of right knee - Plan: PT plan of care cert/re-cert  Stiffness of right knee, not elsewhere classified - Plan: PT plan of care cert/re-cert  Difficulty in walking, not elsewhere classified - Plan: PT plan of care cert/re-cert  Localized swelling, mass and lump, right lower limb - Plan: PT plan of care cert/re-cert      G-Codes - 41/58/30 1559    Functional Assessment Tool Used (Outpatient Only) foto 66% limitation   Functional Limitation Mobility: Walking and moving around   Mobility: Walking and Moving Around Current Status [phone]) At least 60 percent but less than 80 percent impaired, limited or restricted   Mobility: Walking and Moving Around Goal Status [phone]) At least 20 percent but less than 40 percent impaired, limited or restricted       Problem List Patient Active Problem List   Diagnosis Date Noted  . OA (osteoarthritis) of knee 03/28/2017  . Acute medial meniscal tear 04/11/2016    Sumner Boast., PT 04/11/2017, 4:04 PM  [address] [place], [place], [address] Phone: [phone]   Fax:  [phone]  Name: Lisa Oconnell MRN: [id number] Date of Birth: [birth year]

## 2017-04-13 ENCOUNTER — Encounter: Payer: Self-pay | Admitting: Physical Therapy

## 2017-04-13 ENCOUNTER — Ambulatory Visit: Payer: Medicare Other | Admitting: Physical Therapy

## 2017-04-13 DIAGNOSIS — R262 Difficulty in walking, not elsewhere classified: Secondary | ICD-10-CM | POA: Diagnosis not present

## 2017-04-13 DIAGNOSIS — M25561 Pain in right knee: Secondary | ICD-10-CM

## 2017-04-13 DIAGNOSIS — R2241 Localized swelling, mass and lump, right lower limb: Secondary | ICD-10-CM

## 2017-04-13 DIAGNOSIS — M25661 Stiffness of right knee, not elsewhere classified: Secondary | ICD-10-CM

## 2017-04-13 NOTE — Therapy (Signed)
[place], [place], [address] Phone: [phone]   Fax:  [phone]  Physical Therapy Treatment  Patient Details  Name: Lisa Oconnell MRN: [id number] Date of Birth: Jan 20, 1941 Referring Provider: Wynelle Link  Encounter Date: 04/13/2017      PT End of Session - 04/13/17 1134    Visit Number 2   Date for PT Re-Evaluation 06/11/17   PT Start Time 1100   PT Stop Time 1155   PT Time Calculation (min) 55 min      Past Medical History:  Diagnosis Date  . Allergy    year round  . Anxiety   . Arthritis   . Colon polyp 01/2006   adenomatous  . Hyperlipidemia   . Tuberculosis 1966   treated    Past Surgical History:  Procedure Laterality Date  . BREAST EXCISIONAL BIOPSY Right   . BUNIONECTOMY     left  . CATARACT EXTRACTION W/ INTRAOCULAR LENS  IMPLANT, BILATERAL  2013  . HAND CONTRACTURE RELEASE    . KNEE ARTHROSCOPY WITH MEDIAL MENISECTOMY Right 04/11/2016   Procedure: KNEE ARTHROSCOPY WITH MEDIAL MENISECTOMY AND CONDROPLASTY;  Surgeon: Gaynelle Arabian, MD;  Location: [facility];  Service: Orthopedics;  Laterality: Right;  . liposuction (15 yrs ago     stomach  . PARTIAL KNEE ARTHROPLASTY Right 03/28/2017   Procedure: UNICOMPARTMENTAL RIGHT  KNEE;  Surgeon: Gaynelle Arabian, MD;  Location: [facility];  Service: Orthopedics;  Laterality: Right;  Adductor Block  . TUBAL LIGATION  1978    There were no vitals filed for this visit.      Subjective Assessment - 04/13/17 1056    Subjective walked 7000 steps yesterday ( cautioned pt about overdoing and educated on ice and elevation)   Currently in Pain? Yes   Pain Score 2    Pain Location Knee   Pain Orientation Right            [facility] PT Assessment - 04/13/17 0001      AROM   Right Knee Extension 3   Right Knee Flexion 114                     [facility] Adult PT Treatment/Exercise - 04/13/17 0001      Exercises   Exercises Knee/Hip      Knee/Hip Exercises: Aerobic   Recumbent Bike 5 min   Nustep L3 6 min     Knee/Hip Exercises: Machines for Strengthening   Cybex Leg Press 30# 2 sets 10  calf raises 30# 2 sets10     Knee/Hip Exercises: Seated   Long Arc Quad Strengthening;Right;2 sets;10 reps;Weights   Long Arc Quad Weight 2 lbs.   Other Seated Knee/Hip Exercises TKE RT tband 2 sets 10   Hamstring Curl Right;Strengthening;2 sets;10 reps     Modalities   Modalities Cryotherapy;Vasopneumatic     Vasopneumatic   Number Minutes Vasopneumatic  15 minutes   Vasopnuematic Location  Knee   Vasopneumatic Pressure Medium                PT Education - 04/13/17 1115    Education provided Yes   Education Details gentle CFM with Vit E or Coco butter to scar. educated in ice and elevation for swelling   Person(s) Educated Patient   Methods Explanation;Demonstration   Comprehension Verbalized understanding;Returned demonstration          PT Short Term Goals - 04/11/17 1440  PT SHORT TERM GOAL #1   Title independent with initial HEP   Time 2   Period Weeks   Status New           PT Long Term Goals - 04/11/17 1440      PT LONG TERM GOAL #1   Title decrease pain 50%   Time 8   Period Weeks   Status New     PT LONG TERM GOAL #2   Title increase AROM to 4-120 degrees flexion   Time 8   Period Weeks   Status New     PT LONG TERM GOAL #3   Title walk all distances without assistive device   Time 8   Period Weeks   Status New     PT LONG TERM GOAL #4   Title ascend and descend stairs step over step   Time 8   Period Weeks   Status New               Plan - 04/13/17 1134    Clinical Impression Statement moderate swelling and tightness but tolerated ther ex well and increased AROM, cautioned pt about over doing and ice with elevation for swelling   PT Next Visit Plan ther ex and ROM      Patient will benefit from skilled therapeutic intervention in order to improve the  following deficits and impairments:     Visit Diagnosis: Acute pain of right knee  Stiffness of right knee, not elsewhere classified  Difficulty in walking, not elsewhere classified  Localized swelling, mass and lump, right lower limb     Problem List Patient Active Problem List   Diagnosis Date Noted  . OA (osteoarthritis) of knee 03/28/2017  . Acute medial meniscal tear 04/11/2016    Janalyn Higby,ANGIE PTA 04/13/2017, 11:37 AM  [address] [place], [place], [address] Phone: [phone]   Fax:  [phone]  Name: TARAHJI RAMTHUN MRN: [id number] Date of Birth: Mar 10, 1941

## 2017-04-13 NOTE — Addendum Note (Signed)
Addendum  created 04/13/17 0804 by Belinda Block, MD   Delete clinical note

## 2017-04-16 ENCOUNTER — Ambulatory Visit: Payer: Medicare Other | Admitting: Rehabilitation

## 2017-04-16 ENCOUNTER — Encounter: Payer: Self-pay | Admitting: Rehabilitation

## 2017-04-16 DIAGNOSIS — M25661 Stiffness of right knee, not elsewhere classified: Secondary | ICD-10-CM | POA: Diagnosis not present

## 2017-04-16 DIAGNOSIS — M25561 Pain in right knee: Secondary | ICD-10-CM

## 2017-04-16 DIAGNOSIS — R262 Difficulty in walking, not elsewhere classified: Secondary | ICD-10-CM | POA: Diagnosis not present

## 2017-04-16 DIAGNOSIS — R2241 Localized swelling, mass and lump, right lower limb: Secondary | ICD-10-CM | POA: Diagnosis not present

## 2017-04-16 NOTE — Therapy (Signed)
[place] [place], [place], [address] Phone: [phone]   Fax:  [phone]  Physical Therapy Treatment  Patient Details  Name: Lisa Oconnell MRN: [id number] Date of Birth: Oct 17, 1941 Referring Provider: Wynelle Link  Encounter Date: 04/16/2017      PT End of Session - 04/16/17 1603    Visit Number 3   Date for PT Re-Evaluation 06/11/17   PT Start Time 1638   PT Stop Time 1632   PT Time Calculation (min) 57 min   Activity Tolerance Patient tolerated treatment well;Patient limited by pain      Past Medical History:  Diagnosis Date  . Allergy    year round  . Anxiety   . Arthritis   . Colon polyp 01/2006   adenomatous  . Hyperlipidemia   . Tuberculosis 1966   treated    Past Surgical History:  Procedure Laterality Date  . BREAST EXCISIONAL BIOPSY Right   . BUNIONECTOMY     left  . CATARACT EXTRACTION W/ INTRAOCULAR LENS  IMPLANT, BILATERAL  2013  . HAND CONTRACTURE RELEASE    . KNEE ARTHROSCOPY WITH MEDIAL MENISECTOMY Right 04/11/2016   Procedure: KNEE ARTHROSCOPY WITH MEDIAL MENISECTOMY AND CONDROPLASTY;  Surgeon: Gaynelle Arabian, MD;  Location: [facility];  Service: Orthopedics;  Laterality: Right;  . liposuction (15 yrs ago     stomach  . PARTIAL KNEE ARTHROPLASTY Right 03/28/2017   Procedure: UNICOMPARTMENTAL RIGHT  KNEE;  Surgeon: Gaynelle Arabian, MD;  Location: [facility];  Service: Orthopedics;  Laterality: Right;  Adductor Block  . TUBAL LIGATION  1978    There were no vitals filed for this visit.      Subjective Assessment - 04/16/17 1540    Subjective On saturday after the last visit the leg was really hurting and since then it has been hurting to walk.  denies heat, increased swelling, or tenderness   Patient Stated Goals less pain and walk better   Currently in Pain? Yes   Pain Score 2    Pain Location Knee   Pain Orientation Right   Pain Descriptors / Indicators Aching                          [facility] Adult PT Treatment/Exercise - 04/16/17 0001      Knee/Hip Exercises: Aerobic   Recumbent Bike 41min for ROM   Nustep L4 UE/LE x 26min     Knee/Hip Exercises: Machines for Strengthening   Cybex Leg Press 20# 2x15 reported pain with 30 today     Knee/Hip Exercises: Standing   Other Standing Knee Exercises TKE Red x 20     Knee/Hip Exercises: Seated   Long Arc Quad 1 set;10 reps   Long Arc Quad Weight 2 lbs.   Long [employer] Limitations 5" holds   Other Seated Knee/Hip Exercises TKE RT tband 2 sets 10     Knee/Hip Exercises: Supine   Heel Slides 5 reps   Heel Slides Limitations with strap 5" holds   Straight Leg Raises 1 set;10 reps   Straight Leg Raises Limitations with 0# and 2#; slight quad lag present before cueing   Knee Extension PROM;Right;1 set   Knee Extension Limitations heel prop for ROM with Op into extension     Vasopneumatic   Number Minutes Vasopneumatic  15 minutes   Vasopnuematic Location  Knee   Vasopneumatic Pressure Medium   Vasopneumatic Temperature  34  PT Short Term Goals - 04/11/17 1440      PT SHORT TERM GOAL #1   Title independent with initial HEP   Time 2   Period Weeks   Status New           PT Long Term Goals - 04/11/17 1440      PT LONG TERM GOAL #1   Title decrease pain 50%   Time 8   Period Weeks   Status New     PT LONG TERM GOAL #2   Title increase AROM to 4-120 degrees flexion   Time 8   Period Weeks   Status New     PT LONG TERM GOAL #3   Title walk all distances without assistive device   Time 8   Period Weeks   Status New     PT LONG TERM GOAL #4   Title ascend and descend stairs step over step   Time 8   Period Weeks   Status New               Plan - 04/16/17 1607    Clinical Impression Statement Increased pain today and pain with walking per patient.  Reports she may have overdone it on Friday.  Kept intensity of TE slightly  lower today due to increased pain.  Continues to ambulate with flexed knee position and minimal heel strike.     Rehab Potential Good   PT Frequency 2x / week   PT Duration 4 weeks   PT Treatment/Interventions ADLs/Self Care Home Management;Cryotherapy;Associate Professor;Therapeutic activities;Therapeutic exercise;Balance training;Patient/family education;Manual techniques   PT Next Visit Plan ther ex and ROM      Patient will benefit from skilled therapeutic intervention in order to improve the following deficits and impairments:     Visit Diagnosis: Acute pain of right knee  Stiffness of right knee, not elsewhere classified  Difficulty in walking, not elsewhere classified  Localized swelling, mass and lump, right lower limb     Problem List Patient Active Problem List   Diagnosis Date Noted  . OA (osteoarthritis) of knee 03/28/2017  . Acute medial meniscal tear 04/11/2016    Stark Bray, DPT, CMP 04/16/2017, 4:28 PM  [place] [address] [place], [place], [address] Phone: [phone]   Fax:  [phone]  Name: Lisa Oconnell MRN: [id number] Date of Birth: 25-Mar-1941

## 2017-04-16 NOTE — Discharge Summary (Signed)
Physician Discharge Summary   Patient ID: Lisa Oconnell MRN: [id number] DOB/AGE: 1941/10/18 76 y.o.  Admit date: 03/28/2017 Discharge date: 03/29/2017  Primary Diagnosis:  Medial compartment osteoarthritis, Right knee  Admission Diagnoses:  Past Medical History:  Diagnosis Date  . Allergy    year round  . Anxiety   . Arthritis   . Colon polyp 01/2006   adenomatous  . Hyperlipidemia   . Tuberculosis 1966   treated   Discharge Diagnoses:   Principal Problem:   OA (osteoarthritis) of knee  Estimated body mass index is 23.78 kg/m as calculated from the following:   Height as of this encounter: _0  (1.575 m).   Weight as of this encounter: 59 kg (130 lb).  Procedure:  Procedure(s) (LRB): UNICOMPARTMENTAL RIGHT  KNEE (Right)   Consults: None  HPI: Lisa Oconnell is a 76 y.o. female, who has  significant isolated medial compartment arthritis of the Right knee. The patient has had nonoperative management including injections of cortisone and viscous supplements. Unfortunately, the pain persists.  Radiograph showed isolated medial compartment bone-on-bone arthritis  with normal-appearing patellofemoral and lateral compartments. The patient presents now for left knee unicompartmental arthroplasty.  Laboratory Data: Admission on 03/28/2017, Discharged on 03/29/2017  Component Date Value Ref Range Status  . WBC 03/29/2017 8.9  4.0 - 10.5 K/uL Final  . RBC 03/29/2017 3.84* 3.87 - 5.11 MIL/uL Final  . Hemoglobin 03/29/2017 11.6* 12.0 - 15.0 g/dL Final  . HCT 03/29/2017 35.0* 36.0 - 46.0 % Final  . MCV 03/29/2017 91.1  78.0 - 100.0 fL Final  . MCH 03/29/2017 30.2  26.0 - 34.0 pg Final  . MCHC 03/29/2017 33.1  30.0 - 36.0 g/dL Final  . RDW 03/29/2017 13.3  11.5 - 15.5 % Final  . Platelets 03/29/2017 304  150 - 400 K/uL Final  . Sodium 03/29/2017 138  135 - 145 mmol/L Final  . Potassium 03/29/2017 4.1  3.5 - 5.1 mmol/L Final  . Chloride 03/29/2017 103  101 - 111 mmol/L Final   . CO2 03/29/2017 26  22 - 32 mmol/L Final  . Glucose, Bld 03/29/2017 142* 65 - 99 mg/dL Final  . BUN 03/29/2017 9  6 - 20 mg/dL Final  . Creatinine, Ser 03/29/2017 0.82  0.44 - 1.00 mg/dL Final  . Calcium 03/29/2017 8.9  8.9 - 10.3 mg/dL Final  . GFR calc non Af Amer 03/29/2017 >60  >60 mL/min Final  . GFR calc Af Amer 03/29/2017 >60  >60 mL/min Final   Comment: (NOTE) The eGFR has been calculated using the CKD EPI equation. This calculation has not been validated in all clinical situations. eGFR's persistently <60 mL/min signify possible Chronic Kidney Disease.   Georgiann Hahn gap 03/29/2017 9  5 - 15 Final  Hospital Outpatient Visit on 03/23/2017  Component Date Value Ref Range Status  . aPTT 03/23/2017 29  24 - 36 seconds Final  . WBC 03/23/2017 5.9  4.0 - 10.5 K/uL Final  . RBC 03/23/2017 4.31  3.87 - 5.11 MIL/uL Final  . Hemoglobin 03/23/2017 13.1  12.0 - 15.0 g/dL Final  . HCT 03/23/2017 39.3  36.0 - 46.0 % Final  . MCV 03/23/2017 91.2  78.0 - 100.0 fL Final  . MCH 03/23/2017 30.4  26.0 - 34.0 pg Final  . MCHC 03/23/2017 33.3  30.0 - 36.0 g/dL Final  . RDW 03/23/2017 13.2  11.5 - 15.5 % Final  . Platelets 03/23/2017 317  150 - 400 K/uL Final  .  Sodium 03/23/2017 139  135 - 145 mmol/L Final  . Potassium 03/23/2017 4.3  3.5 - 5.1 mmol/L Final  . Chloride 03/23/2017 105  101 - 111 mmol/L Final  . CO2 03/23/2017 27  22 - 32 mmol/L Final  . Glucose, Bld 03/23/2017 99  65 - 99 mg/dL Final  . BUN 03/23/2017 22* 6 - 20 mg/dL Final  . Creatinine, Ser 03/23/2017 0.78  0.44 - 1.00 mg/dL Final  . Calcium 03/23/2017 9.3  8.9 - 10.3 mg/dL Final  . Total Protein 03/23/2017 7.4  6.5 - 8.1 g/dL Final  . Albumin 03/23/2017 4.0  3.5 - 5.0 g/dL Final  . AST 03/23/2017 17  15 - 41 U/L Final  . ALT 03/23/2017 14  14 - 54 U/L Final  . Alkaline Phosphatase 03/23/2017 86  38 - 126 U/L Final  . Total Bilirubin 03/23/2017 0.9  0.3 - 1.2 mg/dL Final  . GFR calc non Af Amer 03/23/2017 >60  >60 mL/min  Final  . GFR calc Af Amer 03/23/2017 >60  >60 mL/min Final   Comment: (NOTE) The eGFR has been calculated using the CKD EPI equation. This calculation has not been validated in all clinical situations. eGFR's persistently <60 mL/min signify possible Chronic Kidney Disease.   . Anion gap 03/23/2017 7  5 - 15 Final  . Prothrombin Time 03/23/2017 12.6  11.4 - 15.2 seconds Final  . INR 03/23/2017 0.94   Final  . ABO/RH(D) 03/23/2017 O NEG   Final  . Antibody Screen 03/23/2017 NEG   Final  . Sample Expiration 03/23/2017 03/31/2017   Final  . Extend sample reason 03/23/2017 NO TRANSFUSIONS OR PREGNANCY IN THE PAST 3 MONTHS   Final  . MRSA, PCR 03/23/2017 NEGATIVE  NEGATIVE Final  . Staphylococcus aureus 03/23/2017 NEGATIVE  NEGATIVE Final   Comment:        The Xpert SA Assay (FDA approved for NASAL specimens in patients over 57 years of age), is one component of a comprehensive surveillance program.  Test performance has been validated by [facility] for patients greater than or equal to 63 year old. It is not intended to diagnose infection nor to guide or monitor treatment.   . ABO/RH(D) 03/23/2017 O NEG   Final     X-Rays:No results found.  EKG:No orders found for this or any previous visit.   Hospital Course: Lisa Oconnell is a 76 y.o. who was admitted to [facility]. They were brought to the operating room on 03/28/2017 and underwent Procedure(s): UNICOMPARTMENTAL RIGHT  KNEE.  Patient tolerated the procedure well and was later transferred to the recovery room and then to the orthopaedic floor for postoperative care.  They were given PO and IV analgesics for pain control following their surgery.  They were given 24 hours of postoperative antibiotics of  Anti-infectives    Start     Dose/Rate Route Frequency Ordered Stop   03/29/17 0600  ceFAZolin (ANCEF) IVPB 2g/100 mL premix     2 g 200 mL/hr over 30 Minutes Intravenous On call to O.R. 03/28/17 1318 03/28/17 1500    03/28/17 2100  ceFAZolin (ANCEF) IVPB 2g/100 mL premix     2 g 200 mL/hr over 30 Minutes Intravenous Every 6 hours 03/28/17 1820 03/29/17 0318   03/28/17 1322  ceFAZolin (ANCEF) 2-4 GM/100ML-% IVPB    Comments:  [place] Session   : cabinet override      03/28/17 1322 03/28/17 1500     and started on DVT  prophylaxis in the form of Xarelto.   PT and OT were ordered for postop therapy protocol.  Discharge planning consulted to help with postop disposition and equipment needs.  Patient had a good night on the evening of surgery.  They started to get up OOB with therapy on day one. Hemovac drain was pulled without difficulty.  Patient was seen in rounds on day one and it was felt that as long as they did well with the remaining sessions of therapy that they would be ready to go home.  Arrangements were made and they were setup to go home on POD 1.  Diet - Cardiac diet Follow up - in 2 weeks Activity - WBAT Dressing - May remove the surgical dressing tomorrow at home and then apply a dry gauze dressing daily. May shower three days following surgery but do not submerge the incision under water. Disposition - Home Condition Upon Discharge - Good D/C Meds - See DC Summary DVT Prophylaxis Xarelto 10 mg daily for ten days, then change to Aspirin 325 mg daily for two weeks, then reduce to Baby Aspirin 81 mg daily for three additional weeks.   Allergies as of 03/29/2017      Reactions   Sulfa Antibiotics Swelling      Medication List    STOP taking these medications   ALEVE 220 MG tablet Generic drug:  naproxen sodium   diclofenac sodium 1 % Gel Commonly known as:  VOLTAREN     TAKE these medications   ALPRAZolam 0.5 MG tablet Commonly known as:  XANAX Take 0.5 mg by mouth at bedtime.   Biotin 1000 MCG tablet Take 1,000 mcg by mouth 2 (two) times daily.   FLUoxetine 20 MG capsule Commonly known as:  PROZAC Take 20 mg by mouth daily.   methocarbamol 500 MG tablet Commonly known as:   ROBAXIN Take 1 tablet (500 mg total) by mouth every 6 (six) hours as needed for muscle spasms.   oxyCODONE 5 MG immediate release tablet Commonly known as:  Oxy IR/ROXICODONE Take 1-2 tablets (5-10 mg total) by mouth every 3 (three) hours as needed for breakthrough pain.   rivaroxaban 10 MG Tabs tablet Commonly known as:  XARELTO Take 1 tablet (10 mg total) by mouth daily with breakfast.   SYSTANE ULTRA OP Place 1 drop into both eyes at bedtime.      Follow-up Information    Gaynelle Arabian, MD. Schedule an appointment as soon as possible for a visit on 04/10/2017.   Specialty:  Orthopedic Surgery Why:  Call [phone] tomorrow to make the appointment Contact information: [address] [place] [address] [phone]        [facility] Follow up.   Specialty:  [place] Why:  physical therapy Contact information: [place] [address] [phone]           Signed: Arlee Muslim, PA-C Orthopaedic Surgery 04/16/2017, 7:45 AM

## 2017-04-19 ENCOUNTER — Ambulatory Visit: Payer: Medicare Other | Admitting: Physical Therapy

## 2017-04-19 ENCOUNTER — Encounter: Payer: Self-pay | Admitting: Physical Therapy

## 2017-04-19 DIAGNOSIS — R2241 Localized swelling, mass and lump, right lower limb: Secondary | ICD-10-CM | POA: Diagnosis not present

## 2017-04-19 DIAGNOSIS — R262 Difficulty in walking, not elsewhere classified: Secondary | ICD-10-CM

## 2017-04-19 DIAGNOSIS — M25661 Stiffness of right knee, not elsewhere classified: Secondary | ICD-10-CM

## 2017-04-19 DIAGNOSIS — M25561 Pain in right knee: Secondary | ICD-10-CM | POA: Diagnosis not present

## 2017-04-19 NOTE — Therapy (Signed)
[place], [place], [address] Phone: [phone]   Fax:  [phone]  Physical Therapy Treatment  Patient Details  Name: Lisa Oconnell MRN: [id number] Date of Birth: Jan 01, 1941 Referring Provider: Wynelle Link  Encounter Date: 04/19/2017      PT End of Session - 04/19/17 1014    Visit Number 4   Date for PT Re-Evaluation 06/11/17   PT Start Time 0930   PT Stop Time 1020   PT Time Calculation (min) 50 min   Activity Tolerance Patient tolerated treatment well;Patient limited by pain   Behavior During Therapy [facility] for tasks assessed/performed      Past Medical History:  Diagnosis Date  . Allergy    year round  . Anxiety   . Arthritis   . Colon polyp 01/2006   adenomatous  . Hyperlipidemia   . Tuberculosis 1966   treated    Past Surgical History:  Procedure Laterality Date  . BREAST EXCISIONAL BIOPSY Right   . BUNIONECTOMY     left  . CATARACT EXTRACTION W/ INTRAOCULAR LENS  IMPLANT, BILATERAL  2013  . HAND CONTRACTURE RELEASE    . KNEE ARTHROSCOPY WITH MEDIAL MENISECTOMY Right 04/11/2016   Procedure: KNEE ARTHROSCOPY WITH MEDIAL MENISECTOMY AND CONDROPLASTY;  Surgeon: Gaynelle Arabian, MD;  Location: [facility];  Service: Orthopedics;  Laterality: Right;  . liposuction (15 yrs ago     stomach  . PARTIAL KNEE ARTHROPLASTY Right 03/28/2017   Procedure: UNICOMPARTMENTAL RIGHT  KNEE;  Surgeon: Gaynelle Arabian, MD;  Location: [facility];  Service: Orthopedics;  Laterality: Right;  Adductor Block  . TUBAL LIGATION  1978    There were no vitals filed for this visit.      Subjective Assessment - 04/19/17 0935    Subjective Patient continues to report right calf soreness with walking, c/o a tightness/grabbing pain, reports that this is worse that the knee pain   Currently in Pain? Yes   Pain Score 5    Pain Location Calf   Pain Orientation Right   Aggravating Factors  walking pain is a 5/10   Pain  Relieving Factors reports that with rest pain a 0/10                         [facility] Adult PT Treatment/Exercise - 04/19/17 0001      Ambulation/Gait   Gait Comments gait without assistive device prior to stretches was painful and antalgic on the right with decreased toe off, after gastroc and soleus stretches gait was without any difficulty with the toe off. and without antalgia     Knee/Hip Exercises: Stretches   Gastroc Stretch 30 seconds;3 reps   Soleus Stretch 3 reps;10 seconds     Knee/Hip Exercises: Aerobic   Recumbent Bike 68min for ROM   Nustep L4 UE/LE x 67min     Knee/Hip Exercises: Supine   Quad Sets Right;2 sets;10 reps   Short Arc Quad Sets Right;2 sets;10 reps   Short Arc Quad Sets Limitations 3#   Heel Slides 5 reps   Heel Slides Limitations with strap 5" holds   Knee Flexion Right;2 sets;10 reps   Knee Flexion Limitations green tband   Other Supine Knee/Hip Exercises ankle DF green tband 2x10     Vasopneumatic   Number Minutes Vasopneumatic  15 minutes   Vasopnuematic Location  Knee   Vasopneumatic Pressure Medium   Vasopneumatic Temperature  40 degrees  PT Education - 04/19/17 0948    Education provided Yes   Education Details Gastroc and soleus stretches   Person(s) Educated Patient   Methods Explanation;Demonstration;Handout   Comprehension Verbalized understanding;Returned demonstration          PT Short Term Goals - 04/19/17 1022      PT SHORT TERM GOAL #1   Title independent with initial HEP   Status Achieved           PT Long Term Goals - 04/11/17 1440      PT LONG TERM GOAL #1   Title decrease pain 50%   Time 8   Period Weeks   Status New     PT LONG TERM GOAL #2   Title increase AROM to 4-120 degrees flexion   Time 8   Period Weeks   Status New     PT LONG TERM GOAL #3   Title walk all distances without assistive device   Time 8   Period Weeks   Status New     PT LONG TERM GOAL #4    Title ascend and descend stairs step over step   Time 8   Period Weeks   Status New               Plan - 04/19/17 1020    Clinical Impression Statement Patient with increased difficulty walking due to pain in the right calf, there is no temp., tenderness or redness, had a negative Homan's sign.  It seemed to be a calf strain as she walked without pain and without limp after doing gastroc and soleus stretches.  we backed down her exercises today   PT Next Visit Plan advised her to not over do it and to stretch daily   Consulted and Agree with Plan of Care Patient      Patient will benefit from skilled therapeutic intervention in order to improve the following deficits and impairments:  Abnormal gait, Decreased activity tolerance, Decreased mobility, Decreased strength, Increased edema, Decreased range of motion, Difficulty walking, Pain, Impaired flexibility  Visit Diagnosis: Acute pain of right knee  Stiffness of right knee, not elsewhere classified  Difficulty in walking, not elsewhere classified  Localized swelling, mass and lump, right lower limb     Problem List Patient Active Problem List   Diagnosis Date Noted  . OA (osteoarthritis) of knee 03/28/2017  . Acute medial meniscal tear 04/11/2016    [place] Boast., PT 04/19/2017, 10:23 AM  [facility] [address] [address] [place], [place], [address] Phone: [phone]   Fax:  [phone]  Name: Lisa Oconnell MRN: [id number] Date of Birth: 07/31/1941

## 2017-04-23 ENCOUNTER — Encounter: Payer: Self-pay | Admitting: Physical Therapy

## 2017-04-23 ENCOUNTER — Ambulatory Visit: Payer: Medicare Other | Admitting: Physical Therapy

## 2017-04-23 DIAGNOSIS — M25661 Stiffness of right knee, not elsewhere classified: Secondary | ICD-10-CM

## 2017-04-23 DIAGNOSIS — R262 Difficulty in walking, not elsewhere classified: Secondary | ICD-10-CM

## 2017-04-23 DIAGNOSIS — R2241 Localized swelling, mass and lump, right lower limb: Secondary | ICD-10-CM | POA: Diagnosis not present

## 2017-04-23 DIAGNOSIS — M25561 Pain in right knee: Secondary | ICD-10-CM

## 2017-04-23 NOTE — Therapy (Addendum)
[place], [place], [address] Phone: [phone]   Fax:  [phone]  Physical Therapy Treatment  Patient Details  Name: Lisa Oconnell MRN: [id number] Date of Birth: May 24, 1941 Referring Provider: Wynelle Link  Encounter Date: 04/23/2017      PT End of Session - 04/23/17 0921    Visit Number 5   Date for PT Re-Evaluation 06/11/17   PT Start Time 0843   PT Stop Time 0940   PT Time Calculation (min) 57 min   Activity Tolerance Patient tolerated treatment well   Behavior During Therapy [facility] for tasks assessed/performed      Past Medical History:  Diagnosis Date  . Allergy    year round  . Anxiety   . Arthritis   . Colon polyp 01/2006   adenomatous  . Hyperlipidemia   . Tuberculosis 1966   treated    Past Surgical History:  Procedure Laterality Date  . BREAST EXCISIONAL BIOPSY Right   . BUNIONECTOMY     left  . CATARACT EXTRACTION W/ INTRAOCULAR LENS  IMPLANT, BILATERAL  2013  . HAND CONTRACTURE RELEASE    . KNEE ARTHROSCOPY WITH MEDIAL MENISECTOMY Right 04/11/2016   Procedure: KNEE ARTHROSCOPY WITH MEDIAL MENISECTOMY AND CONDROPLASTY;  Surgeon: Gaynelle Arabian, MD;  Location: [facility];  Service: Orthopedics;  Laterality: Right;  . liposuction (15 yrs ago     stomach  . PARTIAL KNEE ARTHROPLASTY Right 03/28/2017   Procedure: UNICOMPARTMENTAL RIGHT  KNEE;  Surgeon: Gaynelle Arabian, MD;  Location: [facility];  Service: Orthopedics;  Laterality: Right;  Adductor Block  . TUBAL LIGATION  1978    There were no vitals filed for this visit.      Subjective Assessment - 04/23/17 0844    Subjective Patient reports that she had a bad weekend, did not do any exercises, she reports her daughter was diagnosed with CA.  Reports some popping in the knee   Currently in Pain? Yes   Pain Score 4    Pain Location Knee   Pain Orientation Right;Anterior;Posterior                         [facility]  Adult PT Treatment/Exercise - 04/23/17 0001      Ambulation/Gait   Gait Comments worked on stairs step over step pattern with hand rail, gait down the hall, no device, some cues to decrease trunk lean     Knee/Hip Exercises: Stretches   Passive Hamstring Stretch 4 reps;20 seconds   Gastroc Stretch 30 seconds;3 reps   Soleus Stretch 3 reps;10 seconds     Knee/Hip Exercises: Aerobic   Recumbent Bike 31mn for ROM   Nustep L4 UE/LE x 646m     Knee/Hip Exercises: Standing   Knee Flexion Right;2 sets;10 reps   Knee Flexion Limitations 2.5#   Hip Flexion Right;2 sets;10 reps   Hip Flexion Limitations 2.5#   Hip Abduction Right;2 sets;10 reps   Abduction Limitations 2.5#     Knee/Hip Exercises: Seated   Long Arc Quad 2 sets;10 reps   Long Arc Quad Weight 2 lbs.   Ball Squeeze behind knee and for adducors     Knee/Hip Exercises: Supine   Short Arc Qu[employer] Corporationight;2 sets;10 reps   Short Arc Qu[employer] Corporationimitations 2.5#   Other Supine Knee/Hip Exercises ankle DF green tband 2x10   Other Supine Knee/Hip Exercises Feet on ball K2C, bridge and ispometric abdominals  Vasopneumatic   Number Minutes Vasopneumatic  15 minutes   Vasopnuematic Location  Knee   Vasopneumatic Pressure Medium   Vasopneumatic Temperature  35 degrees                  PT Short Term Goals - 04/19/17 1022      PT SHORT TERM GOAL #1   Title independent with initial HEP   Status Achieved           PT Long Term Goals - 04/11/17 1440      PT LONG TERM GOAL #1   Title decrease pain 50%   Time 8   Period Weeks   Status New     PT LONG TERM GOAL #2   Title increase AROM to 4-120 degrees flexion   Time 8   Period Weeks   Status New     PT LONG TERM GOAL #3   Title walk all distances without assistive device   Time 8   Period Weeks   Status New     PT LONG TERM GOAL #4   Title ascend and descend stairs step over step   Time 8   Period Weeks   Status New               Plan  - 04/23/17 5670    Clinical Impression Statement Patient doing very well, she has been on her feet all weekend and has had some increased life stress, she has increased swelling today, still with c/o posterior knee pain   PT Next Visit Plan advised her to not over do it and to stretch daily   Consulted and Agree with Plan of Care Patient      Patient will benefit from skilled therapeutic intervention in order to improve the following deficits and impairments:  Abnormal gait, Decreased activity tolerance, Decreased mobility, Decreased strength, Increased edema, Decreased range of motion, Difficulty walking, Pain, Impaired flexibility  Visit Diagnosis: Acute pain of right knee  Stiffness of right knee, not elsewhere classified  Difficulty in walking, not elsewhere classified  Localized swelling, mass and lump, right lower limb   Patient called, MD okayed DC as she is doing well.  Goals met   Problem List Patient Active Problem List   Diagnosis Date Noted  . OA (osteoarthritis) of knee 03/28/2017  . Acute medial meniscal tear 04/11/2016    [place] Boast., PT 04/23/2017, 9:23 AM  [address] [address] [place], [place], [address] Phone: [phone]   Fax:  [phone]  Name: Lisa Oconnell MRN: [id number] Date of Birth: July 05, 1941

## 2017-04-26 ENCOUNTER — Encounter: Payer: Medicare Other | Admitting: Physical Therapy

## 2017-04-27 ENCOUNTER — Ambulatory Visit: Payer: Medicare Other | Admitting: Physical Therapy

## 2017-04-27 DIAGNOSIS — Z96651 Presence of right artificial knee joint: Secondary | ICD-10-CM | POA: Diagnosis not present

## 2017-04-27 DIAGNOSIS — M1711 Unilateral primary osteoarthritis, right knee: Secondary | ICD-10-CM | POA: Diagnosis not present

## 2017-04-27 DIAGNOSIS — Z471 Aftercare following joint replacement surgery: Secondary | ICD-10-CM | POA: Diagnosis not present

## 2017-04-30 ENCOUNTER — Ambulatory Visit: Payer: Medicare Other | Admitting: Physical Therapy

## 2017-05-29 DIAGNOSIS — M1711 Unilateral primary osteoarthritis, right knee: Secondary | ICD-10-CM | POA: Diagnosis not present

## 2017-06-14 DIAGNOSIS — E784 Other hyperlipidemia: Secondary | ICD-10-CM | POA: Diagnosis not present

## 2017-06-14 DIAGNOSIS — R358 Other polyuria: Secondary | ICD-10-CM | POA: Diagnosis not present

## 2017-06-14 DIAGNOSIS — R3129 Other microscopic hematuria: Secondary | ICD-10-CM | POA: Diagnosis not present

## 2017-06-14 DIAGNOSIS — M859 Disorder of bone density and structure, unspecified: Secondary | ICD-10-CM | POA: Diagnosis not present

## 2017-06-21 DIAGNOSIS — M859 Disorder of bone density and structure, unspecified: Secondary | ICD-10-CM | POA: Diagnosis not present

## 2017-06-21 DIAGNOSIS — F3341 Major depressive disorder, recurrent, in partial remission: Secondary | ICD-10-CM | POA: Diagnosis not present

## 2017-06-21 DIAGNOSIS — L237 Allergic contact dermatitis due to plants, except food: Secondary | ICD-10-CM | POA: Diagnosis not present

## 2017-06-21 DIAGNOSIS — M25551 Pain in right hip: Secondary | ICD-10-CM | POA: Diagnosis not present

## 2017-06-21 DIAGNOSIS — Z0000 Encounter for general adult medical examination without abnormal findings: Secondary | ICD-10-CM | POA: Diagnosis not present

## 2017-06-21 DIAGNOSIS — G4709 Other insomnia: Secondary | ICD-10-CM | POA: Diagnosis not present

## 2017-06-21 DIAGNOSIS — Z1389 Encounter for screening for other disorder: Secondary | ICD-10-CM | POA: Diagnosis not present

## 2017-06-21 DIAGNOSIS — E784 Other hyperlipidemia: Secondary | ICD-10-CM | POA: Diagnosis not present

## 2017-06-21 DIAGNOSIS — K219 Gastro-esophageal reflux disease without esophagitis: Secondary | ICD-10-CM | POA: Diagnosis not present

## 2017-06-21 DIAGNOSIS — K5909 Other constipation: Secondary | ICD-10-CM | POA: Diagnosis not present

## 2017-06-21 DIAGNOSIS — Z6823 Body mass index (BMI) 23.0-23.9, adult: Secondary | ICD-10-CM | POA: Diagnosis not present

## 2017-06-21 DIAGNOSIS — D126 Benign neoplasm of colon, unspecified: Secondary | ICD-10-CM | POA: Diagnosis not present

## 2018-03-18 ENCOUNTER — Other Ambulatory Visit: Payer: Self-pay | Admitting: Internal Medicine

## 2018-03-18 ENCOUNTER — Encounter: Payer: Self-pay | Admitting: Gastroenterology

## 2018-03-18 DIAGNOSIS — Z1231 Encounter for screening mammogram for malignant neoplasm of breast: Secondary | ICD-10-CM

## 2018-04-12 ENCOUNTER — Ambulatory Visit
Admission: RE | Admit: 2018-04-12 | Discharge: 2018-04-12 | Disposition: A | Discharge status: Home / Self Care | Payer: Medicare Other | Source: Ambulatory Visit | Attending: Internal Medicine | Admitting: Internal Medicine

## 2018-04-12 DIAGNOSIS — Z1231 Encounter for screening mammogram for malignant neoplasm of breast: Secondary | ICD-10-CM

## 2018-05-21 ENCOUNTER — Ambulatory Visit (AMBULATORY_SURGERY_CENTER): Payer: Self-pay

## 2018-05-21 VITALS — Ht 61.5 in | Wt 130.2 lb

## 2018-05-21 DIAGNOSIS — Z86010 Personal history of colonic polyps: Secondary | ICD-10-CM

## 2018-05-21 MED ORDER — NA SULFATE-K SULFATE-MG SULF 17.5-3.13-1.6 GM/177ML PO SOLN: 1.0000 | Freq: Once | ORAL | 0 refills | Status: AC

## 2018-05-21 NOTE — Progress Notes (Signed)
Per pt, no allergies to soy or egg products.Pt not taking any weight loss meds or using  O2 at home.  Pt refused emmi video. 

## 2018-05-29 ENCOUNTER — Telehealth: Payer: Self-pay | Admitting: *Deleted

## 2018-05-29 NOTE — Telephone Encounter (Signed)
Verified with pt that she is no longer on Xarelto.

## 2018-06-04 ENCOUNTER — Encounter: Payer: Self-pay | Admitting: Gastroenterology

## 2018-06-04 ENCOUNTER — Ambulatory Visit (AMBULATORY_SURGERY_CENTER): Payer: Medicare Other | Admitting: Gastroenterology

## 2018-06-04 VITALS — BP 101/59 | HR 54 | Temp 98.0°F | Resp 11 | Ht 61.5 in | Wt 130.0 lb

## 2018-06-04 DIAGNOSIS — K635 Polyp of colon: Secondary | ICD-10-CM | POA: Diagnosis not present

## 2018-06-04 DIAGNOSIS — D125 Benign neoplasm of sigmoid colon: Secondary | ICD-10-CM | POA: Diagnosis not present

## 2018-06-04 DIAGNOSIS — Z86010 Personal history of colonic polyps: Secondary | ICD-10-CM | POA: Diagnosis not present

## 2018-06-04 MED ORDER — SODIUM CHLORIDE 0.9 % IV SOLN: 500.0000 mL | Freq: Once | INTRAVENOUS | Status: DC

## 2018-06-04 NOTE — Patient Instructions (Signed)
Handouts:  Hemorrhoids and Polyps  YOU HAD AN ENDOSCOPIC PROCEDURE TODAY AT [facility]:   Refer to the procedure report that was given to you for any specific questions about what was found during the examination.  If the procedure report does not answer your questions, please call your gastroenterologist to clarify.  If you requested that your care partner not be given the details of your procedure findings, then the procedure report has been included in a sealed envelope for you to review at your convenience later.  YOU SHOULD EXPECT: Some feelings of bloating in the abdomen. Passage of more gas than usual.  Walking can help get rid of the air that was put into your GI tract during the procedure and reduce the bloating. If you had a lower endoscopy (such as a colonoscopy or flexible sigmoidoscopy) you may notice spotting of blood in your stool or on the toilet paper. If you underwent a bowel prep for your procedure, you may not have a normal bowel movement for a few days.  Please Note:  You might notice some irritation and congestion in your nose or some drainage.  This is from the oxygen used during your procedure.  There is no need for concern and it should clear up in a day or so.  SYMPTOMS TO REPORT IMMEDIATELY:   Following lower endoscopy (colonoscopy or flexible sigmoidoscopy):  Excessive amounts of blood in the stool  Significant tenderness or worsening of abdominal pains  Swelling of the abdomen that is new, acute  Fever of 100F or higher   For urgent or emergent issues, a gastroenterologist can be reached at any hour by calling [phone].   DIET:  We do recommend a small meal at first, but then you may proceed to your regular diet.  Drink plenty of fluids but you should avoid alcoholic beverages for 24 hours.  ACTIVITY:  You should plan to take it easy for the rest of today and you should NOT DRIVE or use heavy machinery until tomorrow (because of the  sedation medicines used during the test).    FOLLOW UP: Our staff will call the number listed on your records the next business day following your procedure to check on you and address any questions or concerns that you may have regarding the information given to you following your procedure. If we do not reach you, we will leave a message.  However, if you are feeling well and you are not experiencing any problems, there is no need to return our call.  We will assume that you have returned to your regular daily activities without incident.  If any biopsies were taken you will be contacted by phone or by letter within the next 1-3 weeks.  Please call us at [phone] if you have not heard about the biopsies in 3 weeks.    SIGNATURES/CONFIDENTIALITY: You and/or your care partner have signed paperwork which will be entered into your electronic medical record.  These signatures attest to the fact that that the information above on your After Visit Summary has been reviewed and is understood.  Full responsibility of the confidentiality of this discharge information lies with you and/or your care-partner.

## 2018-06-04 NOTE — Op Note (Signed)
[place] Patient Name: Lisa Oconnell Procedure Date: 06/04/2018 9:22 AM MRN: [id number] Endoscopist: Ladene Artist , MD Age: 77 Referring MD:  Date of Birth: 10/08/1941 Gender: Female Account #: [id number] Procedure:                Colonoscopy Indications:              Surveillance: Personal history of adenomatous                            polyps on last colonoscopy 5 years ago Medicines:                Monitored Anesthesia Care Procedure:                Pre-Anesthesia Assessment:                           - Prior to the procedure, a History and Physical                            was performed, and patient medications and                            allergies were reviewed. The patient's tolerance of                            previous anesthesia was also reviewed. The risks                            and benefits of the procedure and the sedation                            options and risks were discussed with the patient.                            All questions were answered, and informed consent                            was obtained. Prior Anticoagulants: The patient has                            taken no previous anticoagulant or antiplatelet                            agents. ASA Grade Assessment: II - A patient with                            mild systemic disease. After reviewing the risks                            and benefits, the patient was deemed in                            satisfactory condition to undergo the procedure.  After obtaining informed consent, the colonoscope                            was passed under direct vision. Throughout the                            procedure, the patient's blood pressure, pulse, and                            oxygen saturations were monitored continuously. The                            Colonoscope was introduced through the anus and                            advanced to the the cecum,  identified by                            appendiceal orifice and ileocecal valve. The                            ileocecal valve, appendiceal orifice, and rectum                            were photographed. The quality of the bowel                            preparation was good. The colonoscopy was performed                            without difficulty. The patient tolerated the                            procedure well. Scope In: 9:31:04 AM Scope Out: 9:47:58 AM Scope Withdrawal Time: 0 hours 10 minutes 36 seconds  Total Procedure Duration: 0 hours 16 minutes 54 seconds  Findings:                 The perianal and digital rectal examinations were                            normal.                           Two sessile polyps were found in the sigmoid colon.                            The polyps were 5 mm in size. These polyps were                            removed with a cold snare. Resection and retrieval                            were complete.  Internal hemorrhoids were found during                            retroflexion. The hemorrhoids were small and Grade                            I (internal hemorrhoids that do not prolapse).                           The exam was otherwise without abnormality on                            direct and retroflexion views. Complications:            No immediate complications. Estimated blood loss:                            None. Estimated Blood Loss:     Estimated blood loss: none. Impression:               - Two 5 mm polyps in the sigmoid colon, removed                            with a cold snare. Resected and retrieved.                           - Internal hemorrhoids.                           - The examination was otherwise normal on direct                            and retroflexion views. Recommendation:           - Patient has a contact number available for                            emergencies. The signs  and symptoms of potential                            delayed complications were discussed with the                            patient. Return to normal activities tomorrow.                            Written discharge instructions were provided to the                            patient.                           - Resume previous diet.                           - Continue present medications.                           -  Await pathology results.                           - No repeat colonoscopy due to age. Ladene Artist, MD 06/04/2018 10:00:39 AM This report has been signed electronically.

## 2018-06-04 NOTE — Progress Notes (Signed)
Spontaneous respirations throughout. VSS. Resting comfortably. To PACU on room air. Report to  RN. 

## 2018-06-04 NOTE — Progress Notes (Signed)
Called to room to assist during endoscopic procedure.  Patient ID and intended procedure confirmed with present staff. Received instructions for my participation in the procedure from the performing physician.  

## 2018-06-05 ENCOUNTER — Telehealth: Payer: Self-pay

## 2018-06-05 NOTE — Telephone Encounter (Signed)
  Follow up Call-  Call back number 06/04/2018  Post procedure Call Back phone  # [phone]  Permission to leave phone message Yes  Some recent data might be hidden     Patient questions:  Do you have a fever, pain , or abdominal swelling? No. Pain Score  0 *  Have you tolerated food without any problems? Yes.    Have you been able to return to your normal activities? Yes.    Do you have any questions about your discharge instructions: Diet   No. Medications  No. Follow up visit  No.  Do you have questions or concerns about your Care? No.  Actions: * If pain score is 4 or above: No action needed, pain <4.

## 2018-06-06 ENCOUNTER — Encounter: Payer: Self-pay | Admitting: Gastroenterology

## 2018-06-24 DIAGNOSIS — R82998 Other abnormal findings in urine: Secondary | ICD-10-CM | POA: Diagnosis not present

## 2018-06-24 DIAGNOSIS — M859 Disorder of bone density and structure, unspecified: Secondary | ICD-10-CM | POA: Diagnosis not present

## 2018-06-24 DIAGNOSIS — E7849 Other hyperlipidemia: Secondary | ICD-10-CM | POA: Diagnosis not present

## 2018-07-04 DIAGNOSIS — N183 Chronic kidney disease, stage 3 (moderate): Secondary | ICD-10-CM | POA: Diagnosis not present

## 2018-07-04 DIAGNOSIS — F3342 Major depressive disorder, recurrent, in full remission: Secondary | ICD-10-CM | POA: Diagnosis not present

## 2018-07-04 DIAGNOSIS — G4700 Insomnia, unspecified: Secondary | ICD-10-CM | POA: Diagnosis not present

## 2018-07-04 DIAGNOSIS — Z1389 Encounter for screening for other disorder: Secondary | ICD-10-CM | POA: Diagnosis not present

## 2018-07-04 DIAGNOSIS — E7841 Elevated Lipoprotein(a): Secondary | ICD-10-CM | POA: Diagnosis not present

## 2018-07-04 DIAGNOSIS — D126 Benign neoplasm of colon, unspecified: Secondary | ICD-10-CM | POA: Diagnosis not present

## 2018-07-04 DIAGNOSIS — M79672 Pain in left foot: Secondary | ICD-10-CM | POA: Diagnosis not present

## 2018-07-04 DIAGNOSIS — R7301 Impaired fasting glucose: Secondary | ICD-10-CM | POA: Diagnosis not present

## 2018-07-04 DIAGNOSIS — Z0000 Encounter for general adult medical examination without abnormal findings: Secondary | ICD-10-CM | POA: Diagnosis not present

## 2018-07-04 DIAGNOSIS — M545 Low back pain: Secondary | ICD-10-CM | POA: Diagnosis not present

## 2018-07-04 DIAGNOSIS — Z6823 Body mass index (BMI) 23.0-23.9, adult: Secondary | ICD-10-CM | POA: Diagnosis not present

## 2018-07-04 DIAGNOSIS — M8580 Other specified disorders of bone density and structure, unspecified site: Secondary | ICD-10-CM | POA: Diagnosis not present

## 2018-10-09 DIAGNOSIS — L570 Actinic keratosis: Secondary | ICD-10-CM | POA: Diagnosis not present

## 2018-10-09 DIAGNOSIS — Z23 Encounter for immunization: Secondary | ICD-10-CM | POA: Diagnosis not present

## 2018-10-09 DIAGNOSIS — D171 Benign lipomatous neoplasm of skin and subcutaneous tissue of trunk: Secondary | ICD-10-CM | POA: Diagnosis not present

## 2018-10-09 DIAGNOSIS — Z86018 Personal history of other benign neoplasm: Secondary | ICD-10-CM | POA: Diagnosis not present

## 2018-10-09 DIAGNOSIS — D225 Melanocytic nevi of trunk: Secondary | ICD-10-CM | POA: Diagnosis not present

## 2019-04-09 DIAGNOSIS — R69 Illness, unspecified: Secondary | ICD-10-CM | POA: Diagnosis not present

## 2019-07-03 DIAGNOSIS — M8589 Other specified disorders of bone density and structure, multiple sites: Secondary | ICD-10-CM | POA: Diagnosis not present

## 2019-07-03 DIAGNOSIS — E7849 Other hyperlipidemia: Secondary | ICD-10-CM | POA: Diagnosis not present

## 2019-07-03 DIAGNOSIS — M859 Disorder of bone density and structure, unspecified: Secondary | ICD-10-CM | POA: Diagnosis not present

## 2019-07-03 DIAGNOSIS — R7301 Impaired fasting glucose: Secondary | ICD-10-CM | POA: Diagnosis not present

## 2019-07-10 DIAGNOSIS — E785 Hyperlipidemia, unspecified: Secondary | ICD-10-CM | POA: Diagnosis not present

## 2019-07-10 DIAGNOSIS — R69 Illness, unspecified: Secondary | ICD-10-CM | POA: Diagnosis not present

## 2019-07-10 DIAGNOSIS — N183 Chronic kidney disease, stage 3 (moderate): Secondary | ICD-10-CM | POA: Diagnosis not present

## 2019-07-10 DIAGNOSIS — M8580 Other specified disorders of bone density and structure, unspecified site: Secondary | ICD-10-CM | POA: Diagnosis not present

## 2019-07-10 DIAGNOSIS — M25552 Pain in left hip: Secondary | ICD-10-CM | POA: Diagnosis not present

## 2019-07-10 DIAGNOSIS — M545 Low back pain: Secondary | ICD-10-CM | POA: Diagnosis not present

## 2019-07-10 DIAGNOSIS — R7301 Impaired fasting glucose: Secondary | ICD-10-CM | POA: Diagnosis not present

## 2019-07-10 DIAGNOSIS — Z0000 Encounter for general adult medical examination without abnormal findings: Secondary | ICD-10-CM | POA: Diagnosis not present

## 2019-07-10 DIAGNOSIS — K5909 Other constipation: Secondary | ICD-10-CM | POA: Diagnosis not present

## 2019-07-10 DIAGNOSIS — D126 Benign neoplasm of colon, unspecified: Secondary | ICD-10-CM | POA: Diagnosis not present

## 2019-08-13 DIAGNOSIS — Z20828 Contact with and (suspected) exposure to other viral communicable diseases: Secondary | ICD-10-CM | POA: Diagnosis not present

## 2019-10-10 DIAGNOSIS — R69 Illness, unspecified: Secondary | ICD-10-CM | POA: Diagnosis not present

## 2019-10-28 DIAGNOSIS — Z86018 Personal history of other benign neoplasm: Secondary | ICD-10-CM | POA: Diagnosis not present

## 2019-10-28 DIAGNOSIS — Z23 Encounter for immunization: Secondary | ICD-10-CM | POA: Diagnosis not present

## 2019-10-28 DIAGNOSIS — L570 Actinic keratosis: Secondary | ICD-10-CM | POA: Diagnosis not present

## 2019-10-28 DIAGNOSIS — L578 Other skin changes due to chronic exposure to nonionizing radiation: Secondary | ICD-10-CM | POA: Diagnosis not present

## 2019-10-28 DIAGNOSIS — L219 Seborrheic dermatitis, unspecified: Secondary | ICD-10-CM | POA: Diagnosis not present

## 2019-10-28 DIAGNOSIS — D171 Benign lipomatous neoplasm of skin and subcutaneous tissue of trunk: Secondary | ICD-10-CM | POA: Diagnosis not present

## 2019-12-05 ENCOUNTER — Ambulatory Visit: Payer: Medicare Other

## 2019-12-13 ENCOUNTER — Ambulatory Visit: Payer: Medicare HMO | Attending: Internal Medicine

## 2019-12-13 DIAGNOSIS — Z23 Encounter for immunization: Secondary | ICD-10-CM | POA: Insufficient documentation

## 2019-12-13 NOTE — Progress Notes (Signed)
   Covid-19 Vaccination Clinic  Name:  Lisa Oconnell    MRN: [id number] DOB: 1940/12/26  12/13/2019  Lisa Oconnell was observed post Covid-19 immunization for 15 minutes without incidence. She was provided with Vaccine Information Sheet and instruction to access the V-Safe system.   Lisa Oconnell was instructed to call 911 with any severe reactions post vaccine: Marland Kitchen Difficulty breathing  . Swelling of your face and throat  . A fast heartbeat  . A bad rash all over your body  . Dizziness and weakness    Immunizations Administered    Name Date Dose VIS Date Route   Pfizer COVID-19 Vaccine 12/13/2019  4:07 PM 0.3 mL 10/17/2019 Intramuscular   Manufacturer: [place]   Lot: [id number]   [place]: [id number]

## 2019-12-22 ENCOUNTER — Ambulatory Visit: Payer: Medicare Other

## 2020-01-07 ENCOUNTER — Ambulatory Visit: Payer: Medicare HMO | Attending: Internal Medicine

## 2020-01-07 DIAGNOSIS — Z23 Encounter for immunization: Secondary | ICD-10-CM | POA: Insufficient documentation

## 2020-01-07 NOTE — Progress Notes (Signed)
   Covid-19 Vaccination Clinic  Name:  Lisa Oconnell    MRN: [id number] DOB: 1940/12/01  01/07/2020  Lisa Oconnell was observed post Covid-19 immunization for 15 minutes without incident. She was provided with Vaccine Information Sheet and instruction to access the V-Safe system.   Lisa Oconnell was instructed to call 911 with any severe reactions post vaccine: Marland Kitchen Difficulty breathing  . Swelling of face and throat  . A fast heartbeat  . A bad rash all over body  . Dizziness and weakness   Immunizations Administered    Name Date Dose VIS Date Route   Pfizer COVID-19 Vaccine 01/07/2020 12:23 PM 0.3 mL 10/17/2019 Intramuscular   Manufacturer: [place]   Lot: [id number]   [place]: [id number]

## 2020-02-16 DIAGNOSIS — R69 Illness, unspecified: Secondary | ICD-10-CM | POA: Diagnosis not present

## 2020-03-11 DIAGNOSIS — R69 Illness, unspecified: Secondary | ICD-10-CM | POA: Diagnosis not present

## 2020-03-31 DIAGNOSIS — M25561 Pain in right knee: Secondary | ICD-10-CM | POA: Diagnosis not present

## 2020-03-31 DIAGNOSIS — S8265XA Nondisplaced fracture of lateral malleolus of left fibula, initial encounter for closed fracture: Secondary | ICD-10-CM | POA: Diagnosis not present

## 2020-04-01 DIAGNOSIS — Z96651 Presence of right artificial knee joint: Secondary | ICD-10-CM | POA: Insufficient documentation

## 2020-04-01 DIAGNOSIS — M25572 Pain in left ankle and joints of left foot: Secondary | ICD-10-CM | POA: Diagnosis not present

## 2020-04-01 DIAGNOSIS — Z471 Aftercare following joint replacement surgery: Secondary | ICD-10-CM | POA: Diagnosis not present

## 2020-04-06 DIAGNOSIS — M25572 Pain in left ankle and joints of left foot: Secondary | ICD-10-CM | POA: Diagnosis not present

## 2020-04-23 DIAGNOSIS — M5136 Other intervertebral disc degeneration, lumbar region: Secondary | ICD-10-CM | POA: Diagnosis not present

## 2020-04-23 DIAGNOSIS — M545 Low back pain: Secondary | ICD-10-CM | POA: Diagnosis not present

## 2020-05-18 DIAGNOSIS — R69 Illness, unspecified: Secondary | ICD-10-CM | POA: Diagnosis not present

## 2020-05-20 ENCOUNTER — Encounter: Payer: Self-pay | Admitting: Physical Therapy

## 2020-05-20 ENCOUNTER — Ambulatory Visit: Payer: Medicare HMO | Attending: Orthopedic Surgery | Admitting: Physical Therapy

## 2020-05-20 ENCOUNTER — Other Ambulatory Visit: Payer: Self-pay

## 2020-05-20 DIAGNOSIS — G8929 Other chronic pain: Secondary | ICD-10-CM

## 2020-05-20 DIAGNOSIS — M6281 Muscle weakness (generalized): Secondary | ICD-10-CM

## 2020-05-20 DIAGNOSIS — M5442 Lumbago with sciatica, left side: Secondary | ICD-10-CM | POA: Insufficient documentation

## 2020-05-20 NOTE — Therapy (Signed)
[address] [place], [place], [address] Phone: [phone]   Fax:  [phone]  Physical Therapy Evaluation  Patient Details  Name: Lisa Oconnell MRN: [id number] Date of Birth: [birth year] Referring Provider (PT): Estill Bamberg Ward   Encounter Date: 05/20/2020   PT End of Session - 05/20/20 1139    Visit Number 1    Date for PT Re-Evaluation 07/21/20    PT Start Time 0350    PT Stop Time 1053    PT Time Calculation (min) 38 min    Activity Tolerance Patient tolerated treatment well    Behavior During Therapy [facility] for tasks assessed/performed           Past Medical History:  Diagnosis Date  . Allergy    year round  . Anxiety   . Arthritis    per pt/ not aware of it.  . Cataract    bilateral cataract extraction. not sure if lens were implanted.  . Colon polyp 01/2006   adenomatous  . Depression   . Hyperlipidemia   . Tuberculosis 1966   treated    Past Surgical History:  Procedure Laterality Date  . BREAST EXCISIONAL BIOPSY Right    benign/ 79 years old  . BUNIONECTOMY     left  . CATARACT EXTRACTION W/ INTRAOCULAR LENS  IMPLANT, BILATERAL  2013  . cool sculpting     both arms and back  . HAND CONTRACTURE RELEASE     trigger finger  . KNEE ARTHROSCOPY WITH MEDIAL MENISECTOMY Right 04/11/2016   Procedure: KNEE ARTHROSCOPY WITH MEDIAL MENISECTOMY AND CONDROPLASTY;  Surgeon: Gaynelle Arabian, MD;  Location: [facility];  Service: Orthopedics;  Laterality: Right;  . liposuction (15 yrs ago     stomach  . PARTIAL KNEE ARTHROPLASTY Right 03/28/2017   Procedure: UNICOMPARTMENTAL RIGHT  KNEE;  Surgeon: Gaynelle Arabian, MD;  Location: [facility];  Service: Orthopedics;  Laterality: Right;  Adductor Block  . TUBAL LIGATION  1978    There were no vitals filed for this visit.    Subjective Assessment - 05/20/20 0933    Subjective Pt reports LBP beginning about 2 years ago; no MOI reported. Pt had a fall on partial  knee replacement and has B knee pain and ankle pain beginning around the same time as the LBP started worsening. Pt reports B LBP with occasional radiating pain into LLE. Pt states occasional numbness in LLE. Pt reports pain as catching sensation that "stops her in her tracks" and has to be worked out to get moving again.    Patient is accompained by: Family member    Pertinent History partial knee on R 2018    Limitations Sitting;Lifting;Walking;House hold activities    Patient Stated Goals get rid of pain    Currently in Pain? Yes    Pain Score 3     Pain Location Back    Pain Orientation Mid;Right;Left    Pain Descriptors / Indicators Sharp;Aching    Pain Type Chronic pain    Pain Radiating Towards LLE    Pain Onset More than a month ago    Pain Frequency Constant    Aggravating Factors  bending, walking, lifting    Pain Relieving Factors voltairen cream, rest              [facility] PT Assessment - 05/20/20 0001      Assessment   Medical Diagnosis LBP    Referring Provider (PT) Estill Bamberg Ward  Prior Therapy PT for partial knee      Precautions   Precautions None      Restrictions   Weight Bearing Restrictions No      Balance Screen   Has the patient fallen in the past 6 months Yes    How many times? 1   fell on step and broke ankle a few months ago   Has the patient had a decrease in activity level because of a fear of falling?  No    Is the patient reluctant to leave their home because of a fear of falling?  No      Home Environment   Additional Comments doesn't have to do stairs on daily; able to do them with step to pattern and handrail slowly      Prior Function   Level of Independence Independent    Vocation Retired    Leisure cooking, walking      Editor, commissioning Appears Intact      ROM / Strength   AROM / PROM / Strength AROM;Strength      AROM   Overall AROM Comments lumbar AROM WFL except lumbar ext limited 25% and painful (causes LLE N/T)       Strength   Overall Strength Comments WFL BLE       Flexibility   Soft Tissue Assessment /Muscle Length yes    Hamstrings WFL    Quadriceps WFL    ITB WFL    Piriformis tight      Palpation   Palpation comment no tenderness to palpation but tightness in B lumbar paraspinals      Transfers   Five time sit to stand comments  [facility]      Ambulation/Gait   Gait Comments some lateral lean to R with fatigue                      Objective measurements completed on examination: See above findings.       [place] Adult PT Treatment/Exercise - 05/20/20 0001      Exercises   Exercises Lumbar      Lumbar Exercises: Stretches   Single Knee to Chest Stretch Limitations sktc with piriformis stretch    Other Lumbar Stretch Exercise seated fwd/lat flexion      Lumbar Exercises: Supine   Pelvic Tilt 10 reps;5 seconds    Bridge 10 reps;3 seconds    Bridge Limitations cues for PPT                  PT Education - 05/20/20 1139    Education Details Pt educated on HEP and POC    Person(s) Educated Patient    Methods Explanation;Demonstration;Handout    Comprehension Verbalized understanding;Returned demonstration            PT Short Term Goals - 05/20/20 1145      PT SHORT TERM GOAL #1   Title independent with initial HEP    Time 2    Period Weeks    Status New    Target Date 06/03/20             PT Long Term Goals - 05/20/20 1145      PT LONG TERM GOAL #1   Title Pt will demo lumbar extension WFL with no report of increase in radiating LLE pain    Time 6    Period Weeks    Status New    Target Date 07/01/20  PT LONG TERM GOAL #2   Title Pt will report resolution of N/T in LLE    Time 6    Period Weeks    Status New    Target Date 07/01/20      PT LONG TERM GOAL #3   Title Pt will report reduction in pain by 50%    Time 6    Period Weeks    Status New    Target Date 07/01/20                  Plan - 05/20/20 1140    Clinical  Impression Statement Pt presents to clinic with reports of chronic LBP for the past 1-2 years. Pain is worst with lumbar extension, SB to L, and L lumbar rotation. Pt reports occasional N/T in LLE esp with lumbar extension. Pt demos BLE strength WFL, core weakness, and tightness of lumbar paraspinals. Pt would benefit from skilled PT to address the above impairments.    Examination-Activity Limitations Carry;Squat;Stairs;Sit;Stand    Examination-Participation Restrictions Community Activity;Interpersonal Relationship    Stability/Clinical Decision Making Stable/Uncomplicated    Clinical Decision Making Low    Rehab Potential Good    PT Frequency 2x / week    PT Duration 6 weeks    PT Treatment/Interventions ADLs/Self Care Home Management;Electrical Stimulation;Traction;Moist Heat;Iontophoresis 4mg /ml Dexamethasone;Gait training;Stair training;Functional mobility training;Therapeutic activities;Therapeutic exercise;Balance training;Neuromuscular re-education;Manual techniques;Patient/family education;Passive range of motion;Dry needling    PT Next Visit Plan lumbar flexibility, core stab, manual as indicated    PT Home Exercise Plan sktc with piriformis stretch, seated fwd/lat flexion, PPT, PPT with bridge    Consulted and Agree with Plan of Care Patient           Patient will benefit from skilled therapeutic intervention in order to improve the following deficits and impairments:  Abnormal gait, Difficulty walking, Decreased endurance, Decreased activity tolerance, Pain, Postural dysfunction  Visit Diagnosis: Chronic bilateral low back pain with left-sided sciatica  Muscle weakness (generalized)     Problem List Patient Active Problem List   Diagnosis Date Noted  . OA (osteoarthritis) of knee 03/28/2017  . Acute medial meniscal tear 04/11/2016   Amador Cunas, PT, DPT Donald Prose Demarri Elie 05/20/2020, 11:52 AM  [place]  [address] [place], [place], [address] Phone: [phone]   Fax:  [phone]  Name: Lisa Oconnell MRN: [id number] Date of Birth: 05/01/1941

## 2020-05-24 ENCOUNTER — Ambulatory Visit: Payer: Medicare HMO | Admitting: Physical Therapy

## 2020-05-24 ENCOUNTER — Other Ambulatory Visit: Payer: Self-pay

## 2020-05-24 ENCOUNTER — Encounter: Payer: Self-pay | Admitting: Physical Therapy

## 2020-05-24 DIAGNOSIS — G8929 Other chronic pain: Secondary | ICD-10-CM

## 2020-05-24 DIAGNOSIS — M5442 Lumbago with sciatica, left side: Secondary | ICD-10-CM

## 2020-05-24 DIAGNOSIS — M6281 Muscle weakness (generalized): Secondary | ICD-10-CM

## 2020-05-24 NOTE — Therapy (Signed)
[address] [place], [place], [address] Phone: [phone]   Fax:  [phone]  Physical Therapy Treatment  Patient Details  Name: Lisa Oconnell MRN: [id number] Date of Birth: Dec 31, 1940 Referring Provider (PT): Estill Bamberg Ward   Encounter Date: 05/24/2020   PT End of Session - 05/24/20 1536    Visit Number 2    Date for PT Re-Evaluation 07/21/20    PT Start Time 7510    PT Stop Time 1527    PT Time Calculation (min) 42 min    Activity Tolerance Patient tolerated treatment well    Behavior During Therapy [facility] for tasks assessed/performed           Past Medical History:  Diagnosis Date  . Allergy    year round  . Anxiety   . Arthritis    per pt/ not aware of it.  . Cataract    bilateral cataract extraction. not sure if lens were implanted.  . Colon polyp 01/2006   adenomatous  . Depression   . Hyperlipidemia   . Tuberculosis 1966   treated    Past Surgical History:  Procedure Laterality Date  . BREAST EXCISIONAL BIOPSY Right    benign/ 79 years old  . BUNIONECTOMY     left  . CATARACT EXTRACTION W/ INTRAOCULAR LENS  IMPLANT, BILATERAL  2013  . cool sculpting     both arms and back  . HAND CONTRACTURE RELEASE     trigger finger  . KNEE ARTHROSCOPY WITH MEDIAL MENISECTOMY Right 04/11/2016   Procedure: KNEE ARTHROSCOPY WITH MEDIAL MENISECTOMY AND CONDROPLASTY;  Surgeon: Gaynelle Arabian, MD;  Location: [facility];  Service: Orthopedics;  Laterality: Right;  . liposuction (15 yrs ago     stomach  . PARTIAL KNEE ARTHROPLASTY Right 03/28/2017   Procedure: UNICOMPARTMENTAL RIGHT  KNEE;  Surgeon: Gaynelle Arabian, MD;  Location: [facility];  Service: Orthopedics;  Laterality: Right;  Adductor Block  . TUBAL LIGATION  1978    There were no vitals filed for this visit.   Subjective Assessment - 05/24/20 1445    Subjective Pt states that HEP is going okay; does not have LBP today when standing but states  sitting hurts    Currently in Pain? No/denies    Pain Score 0-No pain    Pain Location Back                             [facility] Adult PT Treatment/Exercise - 05/24/20 0001      Lumbar Exercises: Aerobic   Recumbent Bike x 4 min    Nustep L5 x 4 min      Lumbar Exercises: Machines for Strengthening   Other Lumbar Machine Exercise rows and lats 20# 2x10      Lumbar Exercises: Standing   Other Standing Lumbar Exercises standing lumbar extension over exercise ball      Lumbar Exercises: Supine   Pelvic Tilt 10 reps;5 seconds    Other Supine Lumbar Exercises dktc with exercise ball x10    Other Supine Lumbar Exercises deadbugs with exercise ball x5; seated fwd/lat flexion with exercise ball 3x5 sec hold each direction      Manual Therapy   Manual Therapy Soft tissue mobilization;Joint mobilization    Joint Mobilization grade 2-3 to lumbar spine     Soft tissue mobilization STM to L lumbar paraspinals  PT Short Term Goals - 05/20/20 1145      PT SHORT TERM GOAL #1   Title independent with initial HEP    Time 2    Period Weeks    Status New    Target Date 06/03/20             PT Long Term Goals - 05/20/20 1145      PT LONG TERM GOAL #1   Title Pt will demo lumbar extension WFL with no report of increase in radiating LLE pain    Time 6    Period Weeks    Status New    Target Date 07/01/20      PT LONG TERM GOAL #2   Title Pt will report resolution of N/T in LLE    Time 6    Period Weeks    Status New    Target Date 07/01/20      PT LONG TERM GOAL #3   Title Pt will report reduction in pain by 50%    Time 6    Period Weeks    Status New    Target Date 07/01/20                 Plan - 05/24/20 1536    Clinical Impression Statement Pt tolerated initial progression to TE. Pt did well with seated ex's, lumbar flexion, and machine interventions but was unable to tolerate any lumbar extension without complaints  of radiating pain in LLE. Reviewed HEP and correct pt form with PPT and piriformis stretch.    PT Treatment/Interventions ADLs/Self Care Home Management;Electrical Stimulation;Traction;Moist Heat;Iontophoresis 4mg /ml Dexamethasone;Gait training;Stair training;Functional mobility training;Therapeutic activities;Therapeutic exercise;Balance training;Neuromuscular re-education;Manual techniques;Patient/family education;Passive range of motion;Dry needling    PT Next Visit Plan lumbar flexibility, core stab, manual as indicated    Consulted and Agree with Plan of Care Patient           Patient will benefit from skilled therapeutic intervention in order to improve the following deficits and impairments:  Abnormal gait, Difficulty walking, Decreased endurance, Decreased activity tolerance, Pain, Postural dysfunction  Visit Diagnosis: Chronic bilateral low back pain with left-sided sciatica  Muscle weakness (generalized)     Problem List Patient Active Problem List   Diagnosis Date Noted  . OA (osteoarthritis) of knee 03/28/2017  . Acute medial meniscal tear 04/11/2016   Amador Cunas, PT, DPT Donald Prose Macallister Ashmead 05/24/2020, 3:40 PM  [address] [place], [place], [address] Phone: [phone]   Fax:  [phone]  Name: Lisa Oconnell MRN: [id number] Date of Birth: May 10, 1941

## 2020-05-27 ENCOUNTER — Other Ambulatory Visit: Payer: Self-pay

## 2020-05-27 ENCOUNTER — Ambulatory Visit: Payer: Medicare HMO

## 2020-05-27 DIAGNOSIS — G8929 Other chronic pain: Secondary | ICD-10-CM

## 2020-05-27 DIAGNOSIS — M5442 Lumbago with sciatica, left side: Secondary | ICD-10-CM | POA: Diagnosis not present

## 2020-05-27 DIAGNOSIS — M6281 Muscle weakness (generalized): Secondary | ICD-10-CM

## 2020-05-27 NOTE — Therapy (Signed)
[place] [address] [place], [place], [address] Phone: [phone]   Fax:  [phone]  Physical Therapy Treatment  Patient Details  Name: Lisa Oconnell MRN: [id number] Date of Birth: 1941-02-24 Referring Provider (PT): Estill Bamberg Ward   Encounter Date: 05/27/2020   PT End of Session - 05/27/20 0956    Visit Number 3    Date for PT Re-Evaluation 07/21/20    PT Start Time 0934    PT Stop Time 1015    PT Time Calculation (min) 41 min    Activity Tolerance Patient tolerated treatment well    Behavior During Therapy [facility] for tasks assessed/performed           Past Medical History:  Diagnosis Date  . Allergy    year round  . Anxiety   . Arthritis    per pt/ not aware of it.  . Cataract    bilateral cataract extraction. not sure if lens were implanted.  . Colon polyp 01/2006   adenomatous  . Depression   . Hyperlipidemia   . Tuberculosis 1966   treated    Past Surgical History:  Procedure Laterality Date  . BREAST EXCISIONAL BIOPSY Right    benign/ 79 years old  . BUNIONECTOMY     left  . CATARACT EXTRACTION W/ INTRAOCULAR LENS  IMPLANT, BILATERAL  2013  . cool sculpting     both arms and back  . HAND CONTRACTURE RELEASE     trigger finger  . KNEE ARTHROSCOPY WITH MEDIAL MENISECTOMY Right 04/11/2016   Procedure: KNEE ARTHROSCOPY WITH MEDIAL MENISECTOMY AND CONDROPLASTY;  Surgeon: Gaynelle Arabian, MD;  Location: [facility];  Service: Orthopedics;  Laterality: Right;  . liposuction (15 yrs ago     stomach  . PARTIAL KNEE ARTHROPLASTY Right 03/28/2017   Procedure: UNICOMPARTMENTAL RIGHT  KNEE;  Surgeon: Gaynelle Arabian, MD;  Location: [facility];  Service: Orthopedics;  Laterality: Right;  Adductor Block  . TUBAL LIGATION  1978    There were no vitals filed for this visit.   Subjective Assessment - 05/27/20 0936    Subjective Pt reports she has some pain with her normal activities. She thinks she is sleeping  better.    Currently in Pain? No/denies   if I move, I will   Pain Descriptors / Indicators Other (Comment)   takes my breath away                            [facility] Adult PT Treatment/Exercise - 05/27/20 0001      Lumbar Exercises: Aerobic   Elliptical x5 min incline 5, R 1      Lumbar Exercises: Machines for Strengthening   Other Lumbar Machine Exercise Rows 30# x 15 standing, seated LAT PD 25# x10      Lumbar Exercises: Standing   Other Standing Lumbar Exercises standing lumbar extension over exercise ball      Lumbar Exercises: Supine   Ab Set 15 reps;5 seconds    AB Set Limitations orange SB, SA LAT PD press with exhale    Pelvic Tilt 10 reps;5 seconds    Pelvic Tilt Limitations w/exhale    Dead Bug 10 reps    Dead Bug Limitations modified w/orange SB, then full dead bug x 10 ea    Bridge 15 reps;3 seconds    Bridge Limitations with PPT    Other Supine Lumbar Exercises table top hold +  marching                    PT Short Term Goals - 05/20/20 1145      PT SHORT TERM GOAL #1   Title independent with initial HEP    Time 2    Period Weeks    Status New    Target Date 06/03/20             PT Long Term Goals - 05/20/20 1145      PT LONG TERM GOAL #1   Title Pt will demo lumbar extension WFL with no report of increase in radiating LLE pain    Time 6    Period Weeks    Status New    Target Date 07/01/20      PT LONG TERM GOAL #2   Title Pt will report resolution of N/T in LLE    Time 6    Period Weeks    Status New    Target Date 07/01/20      PT LONG TERM GOAL #3   Title Pt will report reduction in pain by 50%    Time 6    Period Weeks    Status New    Target Date 07/01/20                 Plan - 05/27/20 0956    Clinical Impression Statement Pt tolerated session well with one c/o feeling numbness might come on with supported lumbar extension over orange SB at end of session. Educated if she has numbness during an  exercise that dissipates within 10-30 seconds, that is ok, but not if it worsens and persists. Increase core stab and progressed weight for LAT PD and rows.    PT Treatment/Interventions ADLs/Self Care Home Management;Electrical Stimulation;Traction;Moist Heat;Iontophoresis 4mg /ml Dexamethasone;Gait training;Stair training;Functional mobility training;Therapeutic activities;Therapeutic exercise;Balance training;Neuromuscular re-education;Manual techniques;Patient/family education;Passive range of motion;Dry needling    PT Next Visit Plan lumbar flexibility, core stab, manual as indicated    Consulted and Agree with Plan of Care Patient           Patient will benefit from skilled therapeutic intervention in order to improve the following deficits and impairments:  Abnormal gait, Difficulty walking, Decreased endurance, Decreased activity tolerance, Pain, Postural dysfunction  Visit Diagnosis: Chronic bilateral low back pain with left-sided sciatica  Muscle weakness (generalized)     Problem List Patient Active Problem List   Diagnosis Date Noted  . OA (osteoarthritis) of knee 03/28/2017  . Acute medial meniscal tear 04/11/2016    Izell Chappaqua, PT, DPT 05/27/2020, 10:18 AM  [address] [place], [place], [address] Phone: [phone]   Fax:  [phone]  Name: Lisa Oconnell MRN: [id number] Date of Birth: 1941-05-15

## 2020-06-01 ENCOUNTER — Other Ambulatory Visit: Payer: Self-pay

## 2020-06-01 ENCOUNTER — Ambulatory Visit: Payer: Medicare HMO | Admitting: Physical Therapy

## 2020-06-01 ENCOUNTER — Encounter: Payer: Self-pay | Admitting: Physical Therapy

## 2020-06-01 DIAGNOSIS — G8929 Other chronic pain: Secondary | ICD-10-CM | POA: Diagnosis not present

## 2020-06-01 DIAGNOSIS — M6281 Muscle weakness (generalized): Secondary | ICD-10-CM

## 2020-06-01 DIAGNOSIS — M5442 Lumbago with sciatica, left side: Secondary | ICD-10-CM | POA: Diagnosis not present

## 2020-06-01 NOTE — Therapy (Signed)
Lisa Oconnell [address] [place], [place], [address] Phone: [phone]   Fax:  [phone]  Physical Therapy Treatment  Patient Details  Name: Lisa Oconnell MRN: [id number] Date of Birth: 12-18-1940 Referring Provider (PT): Estill Bamberg Ward   Encounter Date: 06/01/2020   PT End of Session - 06/01/20 1012    Visit Number 4    Date for PT Re-Evaluation 07/21/20    PT Start Time 0931    PT Stop Time 1013    PT Time Calculation (min) 42 min    Activity Tolerance Patient tolerated treatment well    Behavior During Therapy [facility] for tasks assessed/performed           Past Medical History:  Diagnosis Date  . Allergy    year round  . Anxiety   . Arthritis    per pt/ not aware of it.  . Cataract    bilateral cataract extraction. not sure if lens were implanted.  . Colon polyp 01/2006   adenomatous  . Depression   . Hyperlipidemia   . Tuberculosis 1966   treated    Past Surgical History:  Procedure Laterality Date  . BREAST EXCISIONAL BIOPSY Right    benign/ 79 years old  . BUNIONECTOMY     left  . CATARACT EXTRACTION W/ INTRAOCULAR LENS  IMPLANT, BILATERAL  2013  . cool sculpting     both arms and back  . HAND CONTRACTURE RELEASE     trigger finger  . KNEE ARTHROSCOPY WITH MEDIAL MENISECTOMY Right 04/11/2016   Procedure: KNEE ARTHROSCOPY WITH MEDIAL MENISECTOMY AND CONDROPLASTY;  Surgeon: Gaynelle Arabian, MD;  [facility]: [facility];  Service: Orthopedics;  Laterality: Right;  . liposuction (15 yrs ago     stomach  . PARTIAL KNEE ARTHROPLASTY Right 03/28/2017   Procedure: UNICOMPARTMENTAL RIGHT  KNEE;  Surgeon: Gaynelle Arabian, MD;  [facility]: [facility];  Service: Orthopedics;  Laterality: Right;  Adductor Block  . TUBAL LIGATION  1978    There were no vitals filed for this visit.   Subjective Assessment - 06/01/20 0939    Subjective Pt reports she is doing better overall; still has sharp back pain occasionally when  walking. Reports pain in R knee today at 3-4/10.    Currently in Pain? No/denies    Pain Score 0-No pain                             [facility] Adult PT Treatment/Exercise - 06/01/20 0001      Lumbar Exercises: Stretches   Active Hamstring Stretch Right;Left;1 rep;30 seconds    Piriformis Stretch Right;Left;1 rep;30 seconds    Piriformis Stretch Limitations seated with trunk lean    Gastroc Stretch Right;Left;1 rep;30 seconds      Lumbar Exercises: Aerobic   Recumbent Bike L1 x 6 min    Nustep L5 x 6 min      Lumbar Exercises: Machines for Strengthening   Leg Press 20# 2x10 BLE, heel raises 20# 1x15    Other Lumbar Machine Exercise 25# rows and lats 2x10      Lumbar Exercises: Standing   Heel Raises 10 reps;3 seconds    Other Standing Lumbar Exercises standing lumbar extension over exercise ball      Lumbar Exercises: Supine   Dead Bug 10 reps    Bridge with Ball Squeeze 20 reps;3 seconds  PT Short Term Goals - 06/01/20 0943      PT SHORT TERM GOAL #1   Title independent with initial HEP    Time 2    Period Weeks    Status Achieved    Target Date 06/03/20             PT Long Term Goals - 06/01/20 0943      PT LONG TERM GOAL #1   Title Pt will demo lumbar extension WFL with no report of increase in radiating LLE pain    Time 6    Period Weeks    Status Partially Met      PT LONG TERM GOAL #2   Title Pt will report resolution of N/T in LLE    Time 6    Period Weeks    Status Partially Met      PT LONG TERM GOAL #3   Title Pt will report reduction in pain by 50%    Time 6    Period Weeks    Status Partially Met                 Plan - 06/01/20 1012    Clinical Impression Statement Pt tolerated progression of TE well; some numbness reported with seated rows which resolved with lumbar ext over exercise ball. Cues for proper form with seated rows and lat pulldowns.    PT Treatment/Interventions ADLs/Self  Care Home Management;Electrical Stimulation;Traction;Moist Heat;Iontophoresis '4mg'$ /ml Dexamethasone;Gait training;Stair training;Functional mobility training;Therapeutic activities;Therapeutic exercise;Balance training;Neuromuscular re-education;Manual techniques;Patient/family education;Passive range of motion;Dry needling    PT Next Visit Plan lumbar flexibility, core stab, manual as indicated    Consulted and Agree with Plan of Care Patient           Patient will benefit from skilled therapeutic intervention in order to improve the following deficits and impairments:  Abnormal gait, Difficulty walking, Decreased endurance, Decreased activity tolerance, Pain, Postural dysfunction  Visit Diagnosis: Chronic bilateral low back pain with left-sided sciatica  Muscle weakness (generalized)     Problem List Patient Active Problem List   Diagnosis Date Noted  . OA (osteoarthritis) of knee 03/28/2017  . Acute medial meniscal tear 04/11/2016   Lisa Oconnell, PT, DPT Donald Prose Janyah Singleterry 06/01/2020, 10:13 AM  [place] [address] [place], [place], [address] Phone: [phone]   Fax:  [phone]  Name: Lisa Oconnell MRN: [id number] Date of Birth: 07-03-1941

## 2020-06-03 ENCOUNTER — Ambulatory Visit: Payer: Medicare HMO

## 2020-06-03 ENCOUNTER — Other Ambulatory Visit: Payer: Self-pay

## 2020-06-03 DIAGNOSIS — M6281 Muscle weakness (generalized): Secondary | ICD-10-CM

## 2020-06-03 DIAGNOSIS — M5442 Lumbago with sciatica, left side: Secondary | ICD-10-CM | POA: Diagnosis not present

## 2020-06-03 DIAGNOSIS — G8929 Other chronic pain: Secondary | ICD-10-CM | POA: Diagnosis not present

## 2020-06-03 NOTE — Therapy (Signed)
[place] [address] [place], [place], [address] Phone: [phone]   Fax:  [phone]  Physical Therapy Treatment  Patient Details  Name: Lisa Oconnell MRN: [id number] Date of Birth: 10/31/1941 Referring Provider (PT): Estill Bamberg Ward   Encounter Date: 06/03/2020   PT End of Session - 06/03/20 1039    Visit Number 5    Date for PT Re-Evaluation 07/21/20    PT Start Time 0928    PT Stop Time 1015    PT Time Calculation (min) 47 min    Activity Tolerance Patient tolerated treatment well    Behavior During Therapy [facility] for tasks assessed/performed           Past Medical History:  Diagnosis Date  . Allergy    year round  . Anxiety   . Arthritis    per pt/ not aware of it.  . Cataract    bilateral cataract extraction. not sure if lens were implanted.  . Colon polyp 01/2006   adenomatous  . Depression   . Hyperlipidemia   . Tuberculosis 1966   treated    Past Surgical History:  Procedure Laterality Date  . BREAST EXCISIONAL BIOPSY Right    benign/ 79 years old  . BUNIONECTOMY     left  . CATARACT EXTRACTION W/ INTRAOCULAR LENS  IMPLANT, BILATERAL  2013  . cool sculpting     both arms and back  . HAND CONTRACTURE RELEASE     trigger finger  . KNEE ARTHROSCOPY WITH MEDIAL MENISECTOMY Right 04/11/2016   Procedure: KNEE ARTHROSCOPY WITH MEDIAL MENISECTOMY AND CONDROPLASTY;  Surgeon: Gaynelle Arabian, MD;  Location: [facility];  Service: Orthopedics;  Laterality: Right;  . liposuction (15 yrs ago     stomach  . PARTIAL KNEE ARTHROPLASTY Right 03/28/2017   Procedure: UNICOMPARTMENTAL RIGHT  KNEE;  Surgeon: Gaynelle Arabian, MD;  Location: [facility];  Service: Orthopedics;  Laterality: Right;  Adductor Block  . TUBAL LIGATION  1978    There were no vitals filed for this visit.   Subjective Assessment - 06/03/20 1033    Subjective Pt reports she was feeling better until last visit and is not sure what the cause was,  but she was hardly able to walk for a couple of days after last session. Pt admits she spent most of the last 2 days in bed and almost canceled her visits. She wonders if it was the machine work.    Patient Stated Goals get rid of pain    Currently in Pain? Yes    Pain Score 5     Pain Location Back    Pain Orientation Lower                             [facility] Adult PT Treatment/Exercise - 06/03/20 0001      Lumbar Exercises: Stretches   Other Lumbar Stretch Exercise B thoracic rotation in S/L with manual OP at ribcage    restriction to L     Lumbar Exercises: Supine   Pelvic Tilt 10 reps;5 seconds    Pelvic Tilt Limitations BTB above knees with H/L clam    Clam 10 reps;5 seconds    Clam Limitations performed with PPT    Bridge 15 reps;3 seconds    Bridge Limitations with PPT + BTB around knees and approximation of femurs at knees      Manual Therapy   Manual  Therapy Joint mobilization;Soft tissue mobilization;Passive ROM    Manual therapy comments Prone    Joint Mobilization grade 2-4 lumbar and thoracic PAs, sacral compression    Soft tissue mobilization STM/DTM to B lumbar PS, B glutes and piriformis    Passive ROM B passive quad S R>L                  PT Education - 06/03/20 1038    Education Details to discontinue exercises if they are causing increased numbness and pain after exercise vs. OK to continue if numbness occurs but dissipates quickly following exercise    Person(s) Educated Patient    Methods Explanation    Comprehension Verbalized understanding            PT Short Term Goals - 06/01/20 0943      PT SHORT TERM GOAL #1   Title independent with initial HEP    Time 2    Period Weeks    Status Achieved    Target Date 06/03/20             PT Long Term Goals - 06/01/20 0943      PT LONG TERM GOAL #1   Title Pt will demo lumbar extension WFL with no report of increase in radiating LLE pain    Time 6    Period Weeks     Status Partially Met      PT LONG TERM GOAL #2   Title Pt will report resolution of N/T in LLE    Time 6    Period Weeks    Status Partially Met      PT LONG TERM GOAL #3   Title Pt will report reduction in pain by 50%    Time 6    Period Weeks    Status Partially Met                 Plan - 06/03/20 1040    Clinical Impression Statement Pt presents with increased pain following seemingly poor tolerance to increase in machine exercise. Pt regressed today and PT session focused more on manual therapy and gentle core stabilization for pain relief. Educated pt to try heat at home today and to only perform exercises that do not cause prolonged numbness following cessation of exercise. Continue to progress core stab in neutral spine at next session and avoid seated exercise, if numbness present.    PT Treatment/Interventions ADLs/Self Care Home Management;Electrical Stimulation;Traction;Moist Heat;Iontophoresis 54m/ml Dexamethasone;Gait training;Stair training;Functional mobility training;Therapeutic activities;Therapeutic exercise;Balance training;Neuromuscular re-education;Manual techniques;Patient/family education;Passive range of motion;Dry needling    PT Next Visit Plan Core stab and hip strengthening, lumbopelvic rhythm, manual as indicated    Consulted and Agree with Plan of Care Patient           Patient will benefit from skilled therapeutic intervention in order to improve the following deficits and impairments:  Abnormal gait, Difficulty walking, Decreased endurance, Decreased activity tolerance, Pain, Postural dysfunction  Visit Diagnosis: Chronic bilateral low back pain with left-sided sciatica  Muscle weakness (generalized)     Problem List Patient Active Problem List   Diagnosis Date Noted  . OA (osteoarthritis) of knee 03/28/2017  . Acute medial meniscal tear 04/11/2016    KIzell Custar PT, DPT 06/03/2020, 10:43 AM  [address] [address]Phone: [phone]  Fax:  [phone] Name: Lisa CAGEMRN: [id number] of Birth: 1Nov 20, 1942

## 2020-06-08 ENCOUNTER — Ambulatory Visit: Payer: Medicare HMO | Attending: Orthopedic Surgery | Admitting: Physical Therapy

## 2020-06-08 ENCOUNTER — Other Ambulatory Visit: Payer: Self-pay

## 2020-06-08 DIAGNOSIS — G8929 Other chronic pain: Secondary | ICD-10-CM | POA: Insufficient documentation

## 2020-06-08 DIAGNOSIS — M6281 Muscle weakness (generalized): Secondary | ICD-10-CM | POA: Insufficient documentation

## 2020-06-08 DIAGNOSIS — M5442 Lumbago with sciatica, left side: Secondary | ICD-10-CM | POA: Diagnosis not present

## 2020-06-08 NOTE — Therapy (Signed)
[place] [address] [place], [place], [address] Phone: [phone]   Fax:  [phone]  Physical Therapy Treatment  Patient Details  Name: Lisa Oconnell MRN: [id number] Date of Birth: 1940/11/18 Referring Provider (PT): Estill Bamberg Ward   Encounter Date: 06/08/2020   PT End of Session - 06/08/20 0932    Visit Number 6    Date for PT Re-Evaluation 07/21/20    PT Start Time 0845    PT Stop Time 0935    PT Time Calculation (min) 50 min    Activity Tolerance Patient tolerated treatment well    Behavior During Therapy [facility] for tasks assessed/performed           Past Medical History:  Diagnosis Date   Allergy    year round   Anxiety    Arthritis    per pt/ not aware of it.   Cataract    bilateral cataract extraction. not sure if lens were implanted.   Colon polyp 01/2006   adenomatous   Depression    Hyperlipidemia    Tuberculosis 1966   treated    Past Surgical History:  Procedure Laterality Date   BREAST EXCISIONAL BIOPSY Right    benign/ 79 years old   BUNIONECTOMY     left   CATARACT EXTRACTION W/ INTRAOCULAR LENS  IMPLANT, BILATERAL  2013   cool sculpting     both arms and back   HAND CONTRACTURE RELEASE     trigger finger   KNEE ARTHROSCOPY WITH MEDIAL MENISECTOMY Right 04/11/2016   Procedure: KNEE ARTHROSCOPY WITH MEDIAL MENISECTOMY AND CONDROPLASTY;  Surgeon: Gaynelle Arabian, MD;  [facility]: [facility];  Service: Orthopedics;  Laterality: Right;   liposuction (15 yrs ago     stomach   PARTIAL KNEE ARTHROPLASTY Right 03/28/2017   Procedure: UNICOMPARTMENTAL RIGHT  KNEE;  Surgeon: Gaynelle Arabian, MD;  [facility]: [facility];  Service: Orthopedics;  Laterality: Right;  Adductor Block   TUBAL LIGATION  1978    There were no vitals filed for this visit.   Subjective Assessment - 06/08/20 0853    Subjective Pt reports that she does not want to do any machines; states that pain felt better after  stretching last time    Currently in Pain? Yes    Pain Score 5     Pain Location Back                             [facility] Adult PT Treatment/Exercise - 06/08/20 0001      Lumbar Exercises: Supine   Pelvic Tilt 10 reps;5 seconds    Bridge Compliant;3 seconds;10 reps    Bridge Limitations with PPT    Bridge with [employer] 20 reps;3 seconds    Bridge with [employer] Limitations with PPT      Lumbar Exercises: Sidelying   Clam Both;10 reps    Clam Limitations red TB    Other Sidelying Lumbar Exercises thoracic open book rotation x5 B      Modalities   Modalities Moist Heat;Electrical Stimulation      Moist Heat Therapy   Number Minutes Moist Heat 12 Minutes    Moist Heat Location Lumbar Spine      Electrical Stimulation   Electrical Stimulation Location Lumbar    Electrical Stimulation Action IFC    Electrical Stimulation Parameters supine    Electrical Stimulation Goals Pain      Manual  Therapy   Manual Therapy Joint mobilization;Soft tissue mobilization;Passive ROM    Manual therapy comments Prone    Joint Mobilization grade 2-4 lumbar and thoracic PAs, sacral compression    Soft tissue mobilization STM/DTM to B lumbar PS, B glutes and piriformis                    PT Short Term Goals - 06/01/20 0943      PT SHORT TERM GOAL #1   Title independent with initial HEP    Time 2    Period Weeks    Status Achieved    Target Date 06/03/20             PT Long Term Goals - 06/01/20 0943      PT LONG TERM GOAL #1   Title Pt will demo lumbar extension WFL with no report of increase in radiating LLE pain    Time 6    Period Weeks    Status Partially Met      PT LONG TERM GOAL #2   Title Pt will report resolution of N/T in LLE    Time 6    Period Weeks    Status Partially Met      PT LONG TERM GOAL #3   Title Pt will report reduction in pain by 50%    Time 6    Period Weeks    Status Partially Met                 Plan  - 06/08/20 0933    Clinical Impression Statement Pt requested no machine ex's today d/t increased pain after 1st rx which pt attributes to machine work. Pt responded positively to tx last rx so continued focus on gentle core stab and and manual tx. Pt reports relief with heat, STM, and estim.    PT Treatment/Interventions ADLs/Self Care Home Management;Electrical Stimulation;Traction;Moist Heat;Iontophoresis '4mg'$ /ml Dexamethasone;Gait training;Stair training;Functional mobility training;Therapeutic activities;Therapeutic exercise;Balance training;Neuromuscular re-education;Manual techniques;Patient/family education;Passive range of motion;Dry needling    PT Next Visit Plan Core stab and hip strengthening, lumbopelvic rhythm, manual as indicated    Consulted and Agree with Plan of Care Patient           Patient will benefit from skilled therapeutic intervention in order to improve the following deficits and impairments:  Abnormal gait, Difficulty walking, Decreased endurance, Decreased activity tolerance, Pain, Postural dysfunction  Visit Diagnosis: Chronic bilateral low back pain with left-sided sciatica  Muscle weakness (generalized)     Problem List Patient Active Problem List   Diagnosis Date Noted   OA (osteoarthritis) of knee 03/28/2017   Acute medial meniscal tear 04/11/2016   Lisa Oconnell, PT, DPT Donald Prose Oreste Majeed 06/08/2020, 9:40 AM  [place] [address] [place], [place], [address] Phone: [phone]   Fax:  [phone]  Name: Lisa Oconnell MRN: [id number] Date of Birth: 09-03-1941

## 2020-06-10 ENCOUNTER — Ambulatory Visit: Payer: Medicare HMO | Admitting: Physical Therapy

## 2020-07-29 DIAGNOSIS — M859 Disorder of bone density and structure, unspecified: Secondary | ICD-10-CM | POA: Diagnosis not present

## 2020-07-29 DIAGNOSIS — E785 Hyperlipidemia, unspecified: Secondary | ICD-10-CM | POA: Diagnosis not present

## 2020-07-29 DIAGNOSIS — N1831 Chronic kidney disease, stage 3a: Secondary | ICD-10-CM | POA: Diagnosis not present

## 2020-07-29 DIAGNOSIS — R7301 Impaired fasting glucose: Secondary | ICD-10-CM | POA: Diagnosis not present

## 2020-08-04 DIAGNOSIS — L723 Sebaceous cyst: Secondary | ICD-10-CM | POA: Diagnosis not present

## 2020-08-04 DIAGNOSIS — Z961 Presence of intraocular lens: Secondary | ICD-10-CM | POA: Diagnosis not present

## 2020-08-04 DIAGNOSIS — H16143 Punctate keratitis, bilateral: Secondary | ICD-10-CM | POA: Diagnosis not present

## 2020-08-04 DIAGNOSIS — H43812 Vitreous degeneration, left eye: Secondary | ICD-10-CM | POA: Diagnosis not present

## 2020-08-05 ENCOUNTER — Other Ambulatory Visit: Payer: Self-pay | Admitting: Internal Medicine

## 2020-08-05 ENCOUNTER — Other Ambulatory Visit: Payer: Self-pay | Admitting: Geriatric Medicine

## 2020-08-05 DIAGNOSIS — G4700 Insomnia, unspecified: Secondary | ICD-10-CM | POA: Diagnosis not present

## 2020-08-05 DIAGNOSIS — R7301 Impaired fasting glucose: Secondary | ICD-10-CM | POA: Diagnosis not present

## 2020-08-05 DIAGNOSIS — M25552 Pain in left hip: Secondary | ICD-10-CM | POA: Diagnosis not present

## 2020-08-05 DIAGNOSIS — M545 Low back pain: Secondary | ICD-10-CM | POA: Diagnosis not present

## 2020-08-05 DIAGNOSIS — M8580 Other specified disorders of bone density and structure, unspecified site: Secondary | ICD-10-CM | POA: Diagnosis not present

## 2020-08-05 DIAGNOSIS — E785 Hyperlipidemia, unspecified: Secondary | ICD-10-CM

## 2020-08-05 DIAGNOSIS — N1831 Chronic kidney disease, stage 3a: Secondary | ICD-10-CM | POA: Diagnosis not present

## 2020-08-05 DIAGNOSIS — R69 Illness, unspecified: Secondary | ICD-10-CM | POA: Diagnosis not present

## 2020-08-05 DIAGNOSIS — Z0000 Encounter for general adult medical examination without abnormal findings: Secondary | ICD-10-CM | POA: Diagnosis not present

## 2020-08-12 DIAGNOSIS — M8580 Other specified disorders of bone density and structure, unspecified site: Secondary | ICD-10-CM | POA: Diagnosis not present

## 2020-08-12 DIAGNOSIS — N1831 Chronic kidney disease, stage 3a: Secondary | ICD-10-CM | POA: Diagnosis not present

## 2020-08-19 ENCOUNTER — Ambulatory Visit
Admission: RE | Admit: 2020-08-19 | Discharge: 2020-08-19 | Disposition: A | Discharge status: Home / Self Care | Payer: Self-pay | Source: Ambulatory Visit | Attending: Internal Medicine | Admitting: Internal Medicine

## 2020-08-19 DIAGNOSIS — E785 Hyperlipidemia, unspecified: Secondary | ICD-10-CM | POA: Diagnosis not present

## 2020-09-07 DIAGNOSIS — H52223 Regular astigmatism, bilateral: Secondary | ICD-10-CM | POA: Diagnosis not present

## 2020-09-07 DIAGNOSIS — H5212 Myopia, left eye: Secondary | ICD-10-CM | POA: Diagnosis not present

## 2020-09-07 DIAGNOSIS — H5201 Hypermetropia, right eye: Secondary | ICD-10-CM | POA: Diagnosis not present

## 2020-09-07 DIAGNOSIS — M5459 Other low back pain: Secondary | ICD-10-CM | POA: Diagnosis not present

## 2020-09-07 DIAGNOSIS — M545 Low back pain, unspecified: Secondary | ICD-10-CM | POA: Diagnosis not present

## 2020-09-07 DIAGNOSIS — M5136 Other intervertebral disc degeneration, lumbar region: Secondary | ICD-10-CM | POA: Diagnosis not present

## 2020-09-15 DIAGNOSIS — R69 Illness, unspecified: Secondary | ICD-10-CM | POA: Diagnosis not present

## 2020-09-29 DIAGNOSIS — M545 Low back pain, unspecified: Secondary | ICD-10-CM | POA: Diagnosis not present

## 2020-10-11 DIAGNOSIS — M48061 Spinal stenosis, lumbar region without neurogenic claudication: Secondary | ICD-10-CM | POA: Diagnosis not present

## 2020-10-11 DIAGNOSIS — M5136 Other intervertebral disc degeneration, lumbar region: Secondary | ICD-10-CM | POA: Diagnosis not present

## 2020-10-20 DIAGNOSIS — L578 Other skin changes due to chronic exposure to nonionizing radiation: Secondary | ICD-10-CM | POA: Diagnosis not present

## 2020-10-20 DIAGNOSIS — L570 Actinic keratosis: Secondary | ICD-10-CM | POA: Diagnosis not present

## 2020-10-20 DIAGNOSIS — D171 Benign lipomatous neoplasm of skin and subcutaneous tissue of trunk: Secondary | ICD-10-CM | POA: Diagnosis not present

## 2020-10-20 DIAGNOSIS — L408 Other psoriasis: Secondary | ICD-10-CM | POA: Diagnosis not present

## 2020-10-20 DIAGNOSIS — D225 Melanocytic nevi of trunk: Secondary | ICD-10-CM | POA: Diagnosis not present

## 2020-10-20 DIAGNOSIS — S80811A Abrasion, right lower leg, initial encounter: Secondary | ICD-10-CM | POA: Diagnosis not present

## 2020-10-20 DIAGNOSIS — L821 Other seborrheic keratosis: Secondary | ICD-10-CM | POA: Diagnosis not present

## 2020-10-20 DIAGNOSIS — Z86018 Personal history of other benign neoplasm: Secondary | ICD-10-CM | POA: Diagnosis not present

## 2020-10-26 DIAGNOSIS — M5416 Radiculopathy, lumbar region: Secondary | ICD-10-CM | POA: Diagnosis not present

## 2020-11-09 DIAGNOSIS — M48061 Spinal stenosis, lumbar region without neurogenic claudication: Secondary | ICD-10-CM | POA: Diagnosis not present

## 2020-11-09 DIAGNOSIS — M5136 Other intervertebral disc degeneration, lumbar region: Secondary | ICD-10-CM | POA: Diagnosis not present

## 2020-11-09 DIAGNOSIS — M545 Low back pain, unspecified: Secondary | ICD-10-CM | POA: Diagnosis not present

## 2020-12-17 DIAGNOSIS — Z20822 Contact with and (suspected) exposure to covid-19: Secondary | ICD-10-CM | POA: Diagnosis not present

## 2020-12-31 DIAGNOSIS — M5459 Other low back pain: Secondary | ICD-10-CM | POA: Diagnosis not present

## 2021-01-10 DIAGNOSIS — M5459 Other low back pain: Secondary | ICD-10-CM | POA: Diagnosis not present

## 2021-01-12 ENCOUNTER — Other Ambulatory Visit: Payer: Self-pay | Admitting: Specialist

## 2021-01-12 DIAGNOSIS — M545 Low back pain, unspecified: Secondary | ICD-10-CM

## 2021-01-12 DIAGNOSIS — G8929 Other chronic pain: Secondary | ICD-10-CM

## 2021-01-14 ENCOUNTER — Telehealth: Payer: Self-pay

## 2021-01-14 NOTE — Telephone Encounter (Signed)
Phone call to patient to verify medication list and allergies for myelogram procedure. Pt instructed to hold Prozac for 48hrs prior to myelogram appointment time and 24 hours after appointment. Pt also instructed to have a driver the day of the procedure, the procedure would take around 2 hours, and discharge instructions discussed. Pt verbalized understanding.

## 2021-01-19 ENCOUNTER — Ambulatory Visit
Admission: RE | Admit: 2021-01-19 | Discharge: 2021-01-19 | Disposition: A | Discharge status: Home / Self Care | Payer: Medicare HMO | Source: Ambulatory Visit | Attending: Specialist | Admitting: Specialist

## 2021-01-19 DIAGNOSIS — M545 Low back pain, unspecified: Secondary | ICD-10-CM

## 2021-01-19 DIAGNOSIS — G8929 Other chronic pain: Secondary | ICD-10-CM

## 2021-01-19 DIAGNOSIS — M48061 Spinal stenosis, lumbar region without neurogenic claudication: Secondary | ICD-10-CM | POA: Diagnosis not present

## 2021-01-19 DIAGNOSIS — M5136 Other intervertebral disc degeneration, lumbar region: Secondary | ICD-10-CM | POA: Diagnosis not present

## 2021-01-19 MED ORDER — DIAZEPAM 5 MG PO TABS
5.0000 mg | ORAL_TABLET | Freq: Once | ORAL | Status: AC
  Administered 2021-01-19: 5 mg via ORAL

## 2021-01-19 MED ORDER — IOPAMIDOL (ISOVUE-M 200) INJECTION 41%
20.0000 mL | Freq: Once | INTRAMUSCULAR | Status: AC
  Administered 2021-01-19: 20 mL via INTRATHECAL

## 2021-01-19 NOTE — Discharge Instructions (Signed)
Myelogram Discharge Instructions  1. Go home and rest quietly for the next 24 hours.  It is important to lie flat for the next 24 hours.  Get up only to go to the restroom.  You may lie in the bed or on a couch on your back, your stomach, your left side or your right side.  You may have one pillow under your head.  You may have pillows between your knees while you are on your side or under your knees while you are on your back.  2. DO NOT drive today.  Recline the seat as far back as it will go, while still wearing your seat belt, on the way home.  3. You may get up to go to the bathroom as needed.  You may sit up for 10 minutes to eat.  You may resume your normal diet and medications unless otherwise indicated.  Drink lots of extra fluids today and tomorrow.  4. The incidence of headache, nausea, or vomiting is about 5% (one in 20 patients).  If you develop a headache, lie flat and drink plenty of fluids until the headache goes away.  Caffeinated beverages may be helpful.  If you develop severe nausea and vomiting or a headache that does not go away with flat bed rest, call [phone].  5. You may resume normal activities after your 24 hours of bed rest is over; however, do not exert yourself strongly or do any heavy lifting tomorrow. If when you get up you have a headache when standing, go back to bed and force fluids for another 24 hours.  6. Call your physician for a follow-up appointment.  The results of your myelogram will be sent directly to your physician by the following day.  7. If you have any questions or if complications develop after you arrive home, please call [phone].  Discharge instructions have been explained to the patient.  The patient, or the person responsible for the patient, fully understands these instructions  YOU MAY RESUME YOUR PROZAC TOMORROW 01/20/21 AT 1 PM

## 2021-01-19 NOTE — Progress Notes (Signed)
Pt reports she has been off of Prozac for 48 hours and verbalizes understanding not to restart this medication until tomorrow 01/20/21 @ 1pm

## 2021-01-25 DIAGNOSIS — R29818 Other symptoms and signs involving the nervous system: Secondary | ICD-10-CM | POA: Insufficient documentation

## 2021-01-25 DIAGNOSIS — M48061 Spinal stenosis, lumbar region without neurogenic claudication: Secondary | ICD-10-CM | POA: Diagnosis not present

## 2021-01-25 DIAGNOSIS — M48062 Spinal stenosis, lumbar region with neurogenic claudication: Secondary | ICD-10-CM | POA: Diagnosis not present

## 2021-02-02 ENCOUNTER — Ambulatory Visit: Payer: Self-pay | Admitting: Orthopedic Surgery

## 2021-02-09 ENCOUNTER — Ambulatory Visit: Payer: Self-pay | Admitting: Orthopedic Surgery

## 2021-02-09 NOTE — H&P (Signed)
Lisa Oconnell is an 80 y.o. female.   Chief Complaint: back pain, LE dysthesias HPI: Reported by patient. Reason for Visit: Diagnositc Results (lumbar CT/Myelogram) Context: The patient is years out Location (Lower Extremity): lower back pain ; no leg pain Severity: pain level 5/10 Associated Symptoms: numbness/tingling Medications: The patient is taking Tylenol  Past Medical History:  Diagnosis Date  . Allergy    year round  . Anxiety   . Arthritis    per pt/ not aware of it.  . Cataract    bilateral cataract extraction. not sure if lens were implanted.  . Colon polyp 01/2006   adenomatous  . Depression   . Hyperlipidemia   . Tuberculosis 1966   treated    Past Surgical History:  Procedure Laterality Date  . BREAST EXCISIONAL BIOPSY Right    benign/ 80 years old  . BUNIONECTOMY     left  . CATARACT EXTRACTION W/ INTRAOCULAR LENS  IMPLANT, BILATERAL  2013  . cool sculpting     both arms and back  . HAND CONTRACTURE RELEASE     trigger finger  . KNEE ARTHROSCOPY WITH MEDIAL MENISECTOMY Right 04/11/2016   Procedure: KNEE ARTHROSCOPY WITH MEDIAL MENISECTOMY AND CONDROPLASTY;  Surgeon: Gaynelle Arabian, MD;  Location: [facility];  Service: Orthopedics;  Laterality: Right;  . liposuction (15 yrs ago     stomach  . PARTIAL KNEE ARTHROPLASTY Right 03/28/2017   Procedure: UNICOMPARTMENTAL RIGHT  KNEE;  Surgeon: Gaynelle Arabian, MD;  Location: [facility];  Service: Orthopedics;  Laterality: Right;  Adductor Block  . TUBAL LIGATION  1978    Family History  Problem Relation Age of Onset  . Alzheimer's disease Mother   . Heart disease Father   . Heart attack Father   . Diabetes Sister   . Diabetes Brother   . Breast cancer Neg Hx    Social History:  reports that she has never smoked. She has never used smokeless tobacco. She reports current alcohol use of about 10.0 standard drinks of alcohol per week. She reports that she does not use drugs.  Allergies:  Allergies  Allergen  Reactions  . Sulfa Antibiotics Swelling    Mouth blistered,    Meds: Alprazolam Fluoxetine  Review of Systems  Constitutional: Negative.   HENT: Negative.   Eyes: Negative.   Respiratory: Negative.   Cardiovascular: Negative.   Gastrointestinal: Negative.   Endocrine: Negative.   Genitourinary: Negative.   Musculoskeletal: Positive for back pain.  Neurological: Positive for weakness and numbness.  Hematological: Negative.   Psychiatric/Behavioral: Negative.     There were no vitals taken for this visit. Physical Exam Constitutional:      Appearance: Normal appearance.  HENT:     Head: Normocephalic.     Right Ear: External ear normal.     Left Ear: External ear normal.     Nose: Nose normal.     Mouth/Throat:     Pharynx: Oropharynx is clear.  Cardiovascular:     Rate and Rhythm: Normal rate and regular rhythm.     Pulses: Normal pulses.  Pulmonary:     Effort: Pulmonary effort is normal.  Abdominal:     General: Abdomen is flat.  Musculoskeletal:     Cervical back: Normal range of motion.     Comments: Patient is a 80 year old female.  Gait and Station: Appearance: ambulating with no assistive devices and antalgic gait.  Constitutional: General Appearance: healthy-appearing and distress (mild).  Psychiatric: Mood and Affect: active and  alert.  Cardiovascular System: Edema Right: none; Dorsalis and posterior tibial pulses 2+. Edema Left: none.  Abdomen: Inspection and Palpation: non-distended and no tenderness.  Skin: Inspection and palpation: no rash.  Lumbar Spine: Inspection: normal alignment. Bony Palpation of the Lumbar Spine: tender at lumbosacral junction.. Bony Palpation of the Right Hip: no tenderness of the greater trochanter and tenderness of the SI joint; Pelvis stable. Bony Palpation of the Left Hip: no tenderness of the greater trochanter and tenderness of the SI joint. Soft Tissue Palpation on the Right: No flank pain with percussion. Active  Range of Motion: limited flexion and extention.  Motor Strength: L1 Motor Strength on the Right: hip flexion iliopsoas 5/5. L1 Motor Strength on the Left: hip flexion iliopsoas 5/5. L2-L4 Motor Strength on the Right: knee extension quadriceps 5/5. L2-L4 Motor Strength on the Left: knee extension quadriceps 5/5. L5 Motor Strength on the Right: ankle dorsiflexion tibialis anterior 5/5 and great toe extension extensor hallucis longus 5/5. L5 Motor Strength on the Left: ankle dorsiflexion tibialis anterior 5/5 and great toe extension extensor hallucis longus 5/5. S1 Motor Strength on the Right: plantar flexion gastrocnemius 5/5. S1 Motor Strength on the Left: plantar flexion gastrocnemius 5/5.  Neurological System: Knee Reflex Right: normal (2). Knee Reflex Left: normal (2). Ankle Reflex Right: normal (2). Ankle Reflex Left: normal (2). Babinski Reflex Right: plantar reflex absent. Babinski Reflex Left: plantar reflex absent. Sensation on the Right: normal distal extremities. Sensation on the Left: normal distal extremities. Special Tests on the Right: no clonus of the ankle/knee. Special Tests on the Left: no clonus of the ankle/knee.  Skin:    General: Skin is warm and dry.  Neurological:     Mental Status: She is alert.      Assessment/Plan Impression:  Left-sided claudication secondary to severe spinal stenosis multifactorial at L2-3. Possible contribution from L3-4 on the left.  Plan:  We discussed options including continued conservative treatment which is activity modification avoiding extension favoring flexion.  This has not been successful. We discussed back pain associated with multilevel disc degeneration and facet arthrosis.  We also discussed options that are surgical which would include a lumbar decompression L2-3 possible L3-4 on the left. This would help buttock and leg pain but not back pain.  I had an extensive discussion with the patient concerning the pathology relevant  anatomy and treatment options. At this point exhausting conservative treatment and in the presence of a neurologic deficit we discussed microlumbar decompression. I discussed the risks and benefits including bleeding, infection, DVT, PE, anesthetic complications, worsening in their symptoms, improvement in their symptoms, C SF leakage, epidural fibrosis, need for future surgeries such as revision discectomy and lumbar fusion. I also indicated that this is an operation to basically decompress the nerve root to allow recovery as opposed to curing disc degeneration or arthritis.  I discussed the operative course including overnight in the hospital. Immediate ambulation. Follow-up in 2 weeks for suture removal. 6 weeks until healing of the herniation followed by 6 weeks of reconditioning and strengthening of the core musculature. Also discussed the need to employ the concepts of disc pressure management and core motion following the surgery to minimize the risk of recurrent disc herniation. We will obtain preoperative clearance i if necessary and proceed accordingly.  No history of MRSA. Preoperative clearance.  Plan microlumbar decompression L2-3, possible L3-4, L1-2  Cecilie Kicks, PA-C for Dr. Tonita Cong 02/09/2021, 1:36 PM

## 2021-02-09 NOTE — H&P (View-Only) (Signed)
Lisa Oconnell is an 80 y.o. female.   Chief Complaint: back pain, LE dysthesias HPI: Reported by patient. Reason for Visit: Diagnositc Results (lumbar CT/Myelogram) Context: The patient is years out Location (Lower Extremity): lower back pain ; no leg pain Severity: pain level 5/10 Associated Symptoms: numbness/tingling Medications: The patient is taking Tylenol  Past Medical History:  Diagnosis Date  . Allergy    year round  . Anxiety   . Arthritis    per pt/ not aware of it.  . Cataract    bilateral cataract extraction. not sure if lens were implanted.  . Colon polyp 01/2006   adenomatous  . Depression   . Hyperlipidemia   . Tuberculosis 1966   treated    Past Surgical History:  Procedure Laterality Date  . BREAST EXCISIONAL BIOPSY Right    benign/ 80 years old  . BUNIONECTOMY     left  . CATARACT EXTRACTION W/ INTRAOCULAR LENS  IMPLANT, BILATERAL  2013  . cool sculpting     both arms and back  . HAND CONTRACTURE RELEASE     trigger finger  . KNEE ARTHROSCOPY WITH MEDIAL MENISECTOMY Right 04/11/2016   Procedure: KNEE ARTHROSCOPY WITH MEDIAL MENISECTOMY AND CONDROPLASTY;  Surgeon: Gaynelle Arabian, MD;  Location: [facility];  Service: Orthopedics;  Laterality: Right;  . liposuction (15 yrs ago     stomach  . PARTIAL KNEE ARTHROPLASTY Right 03/28/2017   Procedure: UNICOMPARTMENTAL RIGHT  KNEE;  Surgeon: Gaynelle Arabian, MD;  Location: [facility];  Service: Orthopedics;  Laterality: Right;  Adductor Block  . TUBAL LIGATION  1978    Family History  Problem Relation Age of Onset  . Alzheimer's disease Mother   . Heart disease Father   . Heart attack Father   . Diabetes Sister   . Diabetes Brother   . Breast cancer Neg Hx    Social History:  reports that she has never smoked. She has never used smokeless tobacco. She reports current alcohol use of about 10.0 standard drinks of alcohol per week. She reports that she does not use drugs.  Allergies:  Allergies  Allergen  Reactions  . Sulfa Antibiotics Swelling    Mouth blistered,    Meds: Alprazolam Fluoxetine  Review of Systems  Constitutional: Negative.   HENT: Negative.   Eyes: Negative.   Respiratory: Negative.   Cardiovascular: Negative.   Gastrointestinal: Negative.   Endocrine: Negative.   Genitourinary: Negative.   Musculoskeletal: Positive for back pain.  Neurological: Positive for weakness and numbness.  Hematological: Negative.   Psychiatric/Behavioral: Negative.     There were no vitals taken for this visit. Physical Exam Constitutional:      Appearance: Normal appearance.  HENT:     Head: Normocephalic.     Right Ear: External ear normal.     Left Ear: External ear normal.     Nose: Nose normal.     Mouth/Throat:     Pharynx: Oropharynx is clear.  Cardiovascular:     Rate and Rhythm: Normal rate and regular rhythm.     Pulses: Normal pulses.  Pulmonary:     Effort: Pulmonary effort is normal.  Abdominal:     General: Abdomen is flat.  Musculoskeletal:     Cervical back: Normal range of motion.     Comments: Patient is a 80 year old female.  Gait and Station: Appearance: ambulating with no assistive devices and antalgic gait.  Constitutional: General Appearance: healthy-appearing and distress (mild).  Psychiatric: Mood and Affect: active and  alert.  Cardiovascular System: Edema Right: none; Dorsalis and posterior tibial pulses 2+. Edema Left: none.  Abdomen: Inspection and Palpation: non-distended and no tenderness.  Skin: Inspection and palpation: no rash.  Lumbar Spine: Inspection: normal alignment. Bony Palpation of the Lumbar Spine: tender at lumbosacral junction.. Bony Palpation of the Right Hip: no tenderness of the greater trochanter and tenderness of the SI joint; Pelvis stable. Bony Palpation of the Left Hip: no tenderness of the greater trochanter and tenderness of the SI joint. Soft Tissue Palpation on the Right: No flank pain with percussion. Active  Range of Motion: limited flexion and extention.  Motor Strength: L1 Motor Strength on the Right: hip flexion iliopsoas 5/5. L1 Motor Strength on the Left: hip flexion iliopsoas 5/5. L2-L4 Motor Strength on the Right: knee extension quadriceps 5/5. L2-L4 Motor Strength on the Left: knee extension quadriceps 5/5. L5 Motor Strength on the Right: ankle dorsiflexion tibialis anterior 5/5 and great toe extension extensor hallucis longus 5/5. L5 Motor Strength on the Left: ankle dorsiflexion tibialis anterior 5/5 and great toe extension extensor hallucis longus 5/5. S1 Motor Strength on the Right: plantar flexion gastrocnemius 5/5. S1 Motor Strength on the Left: plantar flexion gastrocnemius 5/5.  Neurological System: Knee Reflex Right: normal (2). Knee Reflex Left: normal (2). Ankle Reflex Right: normal (2). Ankle Reflex Left: normal (2). Babinski Reflex Right: plantar reflex absent. Babinski Reflex Left: plantar reflex absent. Sensation on the Right: normal distal extremities. Sensation on the Left: normal distal extremities. Special Tests on the Right: no clonus of the ankle/knee. Special Tests on the Left: no clonus of the ankle/knee.  Skin:    General: Skin is warm and dry.  Neurological:     Mental Status: She is alert.      Assessment/Plan Impression:  Left-sided claudication secondary to severe spinal stenosis multifactorial at L2-3. Possible contribution from L3-4 on the left.  Plan:  We discussed options including continued conservative treatment which is activity modification avoiding extension favoring flexion.  This has not been successful. We discussed back pain associated with multilevel disc degeneration and facet arthrosis.  We also discussed options that are surgical which would include a lumbar decompression L2-3 possible L3-4 on the left. This would help buttock and leg pain but not back pain.  I had an extensive discussion with the patient concerning the pathology relevant  anatomy and treatment options. At this point exhausting conservative treatment and in the presence of a neurologic deficit we discussed microlumbar decompression. I discussed the risks and benefits including bleeding, infection, DVT, PE, anesthetic complications, worsening in their symptoms, improvement in their symptoms, C SF leakage, epidural fibrosis, need for future surgeries such as revision discectomy and lumbar fusion. I also indicated that this is an operation to basically decompress the nerve root to allow recovery as opposed to curing disc degeneration or arthritis.  I discussed the operative course including overnight in the hospital. Immediate ambulation. Follow-up in 2 weeks for suture removal. 6 weeks until healing of the herniation followed by 6 weeks of reconditioning and strengthening of the core musculature. Also discussed the need to employ the concepts of disc pressure management and core motion following the surgery to minimize the risk of recurrent disc herniation. We will obtain preoperative clearance i if necessary and proceed accordingly.  No history of MRSA. Preoperative clearance.  Plan microlumbar decompression L2-3, possible L3-4, L1-2  Cecilie Kicks, PA-C for Dr. Tonita Cong 02/09/2021, 1:36 PM

## 2021-02-14 NOTE — Progress Notes (Signed)
Surgical Instructions    Your procedure is scheduled on 02/17/21.  Report to [facility] Main Entrance "A" at 05:30 A.M., then check in with the Admitting office.  Call this number if you have problems the morning of surgery:  [phone]   If you have any questions prior to your surgery date call [phone]: Open Monday-Friday 8am-4pm    Remember:  Do not eat after midnight the night before your surgery  You may drink clear liquids until 04:30am the morning of your surgery.   Clear liquids allowed are: Water, Non-Citrus Juices (without pulp), Carbonated Beverages, Clear Tea, Black Coffee Only, and Gatorade  Patient Instructions  . The night before surgery:  o No food after midnight. ONLY clear liquids after midnight  . The day of surgery (if you do NOT have diabetes):  o Drink ONE (1) Pre-Surgery Clear Ensure by 04:30am the morning of surgery. Drink in one sitting. Do not sip.  o This drink was given to you during your hospital  pre-op appointment visit. o Nothing else to drink after completing the  Pre-Surgery Clear Ensure.          If you have questions, please contact your surgeon's office.     Take these medicines the morning of surgery with A SIP OF WATER  FLUoxetine (PROZAC)   As of today, STOP taking any Aspirin (unless otherwise instructed by your surgeon) Aleve, Naproxen, Ibuprofen, Motrin, Advil, Goody's, BC's, all herbal medications, fish oil, and all vitamins.                     Do not wear jewelry, make up, or nail polish            Do not wear lotions, powders, perfumes/colognes, or deodorant.            Do not shave 48 hours prior to surgery.              Do not bring valuables to the hospital.            [facility] is not responsible for any belongings or valuables.  Do NOT Smoke (Tobacco/Vaping) or drink Alcohol 24 hours prior to your procedure If you use a CPAP at night, you may bring all equipment for your overnight stay.   Contacts, glasses,  dentures or bridgework may not be worn into surgery, please bring cases for these belongings   For patients admitted to the hospital, discharge time will be determined by your treatment team.   Patients discharged the day of surgery will not be allowed to drive home, and someone needs to stay with them for 24 hours.    Special instructions:   [facility]- Preparing For Surgery  Before surgery, you can play an important role. Because skin is not sterile, your skin needs to be as free of germs as possible. You can reduce the number of germs on your skin by washing with CHG (chlorahexidine gluconate) Soap before surgery.  CHG is an antiseptic cleaner which kills germs and bonds with the skin to continue killing germs even after washing.    Oral Hygiene is also important to reduce your risk of infection.  Remember - BRUSH YOUR TEETH THE MORNING OF SURGERY WITH YOUR REGULAR TOOTHPASTE  Please do not use if you have an allergy to CHG or antibacterial soaps. If your skin becomes reddened/irritated stop using the CHG.  Do not shave (including legs and underarms) for at least 48 hours prior to first  CHG shower. It is OK to shave your face.  Please follow these instructions carefully.   1. Shower the NIGHT BEFORE SURGERY and the MORNING OF SURGERY  2. If you chose to wash your hair, wash your hair first as usual with your normal shampoo.  3. After you shampoo, rinse your hair and body thoroughly to remove the shampoo.  4. Wash Face and genitals (private parts) with your normal soap.   5.  Shower the NIGHT BEFORE SURGERY and the MORNING OF SURGERY with CHG Soap.   6. Use CHG Soap as you would any other liquid soap. You can apply CHG directly to the skin and wash gently with a scrungie or a clean washcloth.   7. Apply the CHG Soap to your body ONLY FROM THE NECK DOWN.  Do not use on open wounds or open sores. Avoid contact with your eyes, ears, mouth and genitals (private parts). Wash Face and  genitals (private parts)  with your normal soap.   8. Wash thoroughly, paying special attention to the area where your surgery will be performed.  9. Thoroughly rinse your body with warm water from the neck down.  10. DO NOT shower/wash with your normal soap after using and rinsing off the CHG Soap.  11. Pat yourself dry with a CLEAN TOWEL.  12. Wear CLEAN PAJAMAS to bed the night before surgery  13. Place CLEAN SHEETS on your bed the night before your surgery  14. DO NOT SLEEP WITH PETS.   Day of Surgery: Take a shower with CHG soap.  Wear Clean/Comfortable clothing the morning of surgery Do not apply any deodorants/lotions.   Remember to brush your teeth WITH YOUR REGULAR TOOTHPASTE.   Please read over the following fact sheets that you were given.

## 2021-02-15 ENCOUNTER — Inpatient Hospital Stay (HOSPITAL_COMMUNITY)
Admission: RE | Admit: 2021-02-15 | Discharge: 2021-02-15 | Disposition: A | Discharge status: Home / Self Care | Payer: Medicare HMO | Source: Ambulatory Visit

## 2021-02-15 NOTE — Progress Notes (Signed)
Pt confused date of appointment. Baker Janus notified and appt changed to 02/16/21 at 10am.

## 2021-02-16 ENCOUNTER — Encounter (HOSPITAL_COMMUNITY): Payer: Self-pay

## 2021-02-16 ENCOUNTER — Other Ambulatory Visit: Payer: Self-pay

## 2021-02-16 ENCOUNTER — Ambulatory Visit (HOSPITAL_COMMUNITY)
Admission: RE | Admit: 2021-02-16 | Discharge: 2021-02-16 | Disposition: A | Discharge status: Home / Self Care | Payer: Medicare HMO | Source: Ambulatory Visit | Attending: Orthopedic Surgery | Admitting: Orthopedic Surgery

## 2021-02-16 ENCOUNTER — Encounter (HOSPITAL_COMMUNITY)
Admission: RE | Admit: 2021-02-16 | Discharge: 2021-02-16 | Disposition: A | Discharge status: Home / Self Care | Payer: Medicare HMO | Source: Ambulatory Visit | Attending: Specialist | Admitting: Specialist

## 2021-02-16 DIAGNOSIS — M5136 Other intervertebral disc degeneration, lumbar region: Secondary | ICD-10-CM | POA: Diagnosis not present

## 2021-02-16 DIAGNOSIS — M5126 Other intervertebral disc displacement, lumbar region: Secondary | ICD-10-CM

## 2021-02-16 DIAGNOSIS — Z01818 Encounter for other preprocedural examination: Secondary | ICD-10-CM | POA: Insufficient documentation

## 2021-02-16 DIAGNOSIS — Z20822 Contact with and (suspected) exposure to covid-19: Secondary | ICD-10-CM | POA: Insufficient documentation

## 2021-02-16 LAB — COMPREHENSIVE METABOLIC PANEL
ALT: 18 U/L (ref 0–44)
AST: 20 U/L (ref 15–41)
Albumin: 3.8 g/dL (ref 3.5–5.0)
Alkaline Phosphatase: 74 U/L (ref 38–126)
Anion gap: 6 (ref 5–15)
BUN: 17 mg/dL (ref 8–23)
CO2: 27 mmol/L (ref 22–32)
Calcium: 9.3 mg/dL (ref 8.9–10.3)
Chloride: 104 mmol/L (ref 98–111)
Creatinine, Ser: 0.77 mg/dL (ref 0.44–1.00)
GFR, Estimated: >60 mL/min (ref >60)
Glucose, Bld: 80 mg/dL (ref 70–99)
Potassium: 3.6 mmol/L (ref 3.5–5.1)
Sodium: 137 mmol/L (ref 135–145)
Total Bilirubin: 0.9 mg/dL (ref 0.3–1.2)
Total Protein: 6.6 g/dL (ref 6.5–8.1)

## 2021-02-16 LAB — SURGICAL PCR SCREEN
MRSA, PCR: NEGATIVE
Staphylococcus aureus: NEGATIVE

## 2021-02-16 LAB — CBC
HCT: 41.1 % (ref 36.0–46.0)
Hemoglobin: 13.7 g/dL (ref 12.0–15.0)
MCH: 32.1 pg (ref 26.0–34.0)
MCHC: 33.3 g/dL (ref 30.0–36.0)
MCV: 96.3 fL (ref 80.0–100.0)
Platelets: 391 10*3/uL (ref 150–400)
RBC: 4.27 MIL/uL (ref 3.87–5.11)
RDW: 13.4 % (ref 11.5–15.5)
WBC: 5.4 10*3/uL (ref 4.0–10.5)
nRBC: 0 % (ref 0.0–0.2)

## 2021-02-16 LAB — SARS CORONAVIRUS 2 (TAT 6-24 HRS): SARS Coronavirus 2: NEGATIVE

## 2021-02-16 NOTE — Progress Notes (Signed)
PCP - Marton Redwood Cardiologist - denies  PPM/ICD - denies   Chest x-ray - n/a EKG - n/a Stress Test - over 10 years ago-normal per patient ECHO - denies Cardiac Cath - denies   Patient instructed to hold all Aspirin, NSAID's, herbal medications, fish oil and vitamins 7 days prior to surgery.  ERAS Protcol -yes PRE-SURGERY Ensure or G2- ensure given  COVID TEST- 02/16/21   Anesthesia review: no  Patient denies shortness of breath, fever, cough and chest pain at PAT appointment   All instructions explained to the patient, with a verbal understanding of the material. Patient agrees to go over the instructions while at home for a better understanding. Patient also instructed to self quarantine after being tested for COVID-19. The opportunity to ask questions was provided.

## 2021-02-17 ENCOUNTER — Other Ambulatory Visit: Payer: Self-pay

## 2021-02-17 ENCOUNTER — Ambulatory Visit (HOSPITAL_COMMUNITY): Payer: Medicare HMO | Admitting: Certified Registered Nurse Anesthetist

## 2021-02-17 ENCOUNTER — Ambulatory Visit (HOSPITAL_COMMUNITY)
Admission: RE | Admit: 2021-02-17 | Discharge: 2021-02-18 | Disposition: A | Discharge status: Home / Self Care | Payer: Medicare HMO | Attending: Specialist | Admitting: Specialist

## 2021-02-17 ENCOUNTER — Ambulatory Visit (HOSPITAL_COMMUNITY): Payer: Medicare HMO

## 2021-02-17 ENCOUNTER — Encounter (HOSPITAL_COMMUNITY): Payer: Self-pay | Admitting: Specialist

## 2021-02-17 ENCOUNTER — Encounter (HOSPITAL_COMMUNITY)
Admission: RE | Disposition: A | Discharge status: Home / Self Care | Payer: Self-pay | Source: Home / Self Care | Attending: Specialist

## 2021-02-17 DIAGNOSIS — D1779 Benign lipomatous neoplasm of other sites: Secondary | ICD-10-CM | POA: Diagnosis not present

## 2021-02-17 DIAGNOSIS — M5137 Other intervertebral disc degeneration, lumbosacral region: Secondary | ICD-10-CM | POA: Insufficient documentation

## 2021-02-17 DIAGNOSIS — M5126 Other intervertebral disc displacement, lumbar region: Secondary | ICD-10-CM | POA: Insufficient documentation

## 2021-02-17 DIAGNOSIS — Z882 Allergy status to sulfonamides status: Secondary | ICD-10-CM | POA: Diagnosis not present

## 2021-02-17 DIAGNOSIS — M5136 Other intervertebral disc degeneration, lumbar region: Secondary | ICD-10-CM | POA: Insufficient documentation

## 2021-02-17 DIAGNOSIS — M48062 Spinal stenosis, lumbar region with neurogenic claudication: Secondary | ICD-10-CM | POA: Insufficient documentation

## 2021-02-17 DIAGNOSIS — M179 Osteoarthritis of knee, unspecified: Secondary | ICD-10-CM | POA: Diagnosis not present

## 2021-02-17 DIAGNOSIS — M47819 Spondylosis without myelopathy or radiculopathy, site unspecified: Secondary | ICD-10-CM | POA: Insufficient documentation

## 2021-02-17 DIAGNOSIS — M461 Sacroiliitis, not elsewhere classified: Secondary | ICD-10-CM | POA: Diagnosis not present

## 2021-02-17 DIAGNOSIS — R69 Illness, unspecified: Secondary | ICD-10-CM | POA: Diagnosis not present

## 2021-02-17 DIAGNOSIS — M48061 Spinal stenosis, lumbar region without neurogenic claudication: Secondary | ICD-10-CM | POA: Diagnosis not present

## 2021-02-17 DIAGNOSIS — M9689 Other intraoperative and postprocedural complications and disorders of the musculoskeletal system: Secondary | ICD-10-CM | POA: Diagnosis not present

## 2021-02-17 DIAGNOSIS — E785 Hyperlipidemia, unspecified: Secondary | ICD-10-CM | POA: Diagnosis not present

## 2021-02-17 DIAGNOSIS — M2578 Osteophyte, vertebrae: Secondary | ICD-10-CM | POA: Diagnosis not present

## 2021-02-17 DIAGNOSIS — Z419 Encounter for procedure for purposes other than remedying health state, unspecified: Secondary | ICD-10-CM

## 2021-02-17 HISTORY — PX: LUMBAR LAMINECTOMY/DECOMPRESSION MICRODISCECTOMY: SHX5026

## 2021-02-17 SURGERY — LUMBAR LAMINECTOMY/DECOMPRESSION MICRODISCECTOMY 3 LEVELS
Anesthesia: General | Site: Back

## 2021-02-17 MED ORDER — BISACODYL 5 MG PO TBEC
5.0000 mg | DELAYED_RELEASE_TABLET | Freq: Every day | ORAL | Status: DC | PRN
Start: 2021-02-17 — End: 2021-02-18

## 2021-02-17 MED ORDER — MENTHOL 3 MG MT LOZG: 1.0000 | LOZENGE | OROMUCOSAL | Status: DC | PRN

## 2021-02-17 MED ORDER — EPHEDRINE SULFATE-NACL 50-0.9 MG/10ML-% IV SOSY
PREFILLED_SYRINGE | INTRAVENOUS | Status: DC | PRN
  Administered 2021-02-17: 15 mg via INTRAVENOUS
  Administered 2021-02-17 (×2): 10 mg via INTRAVENOUS

## 2021-02-17 MED ORDER — KCL IN DEXTROSE-NACL 20-5-0.45 MEQ/L-%-% IV SOLN: INTRAVENOUS | Status: DC

## 2021-02-17 MED ORDER — PROPOFOL 10 MG/ML IV BOLUS
INTRAVENOUS | Status: AC
  Filled 2021-02-17: qty 20

## 2021-02-17 MED ORDER — ROCURONIUM BROMIDE 10 MG/ML (PF) SYRINGE
PREFILLED_SYRINGE | INTRAVENOUS | Status: DC | PRN
  Administered 2021-02-17: 70 mg via INTRAVENOUS
  Administered 2021-02-17: 10 mg via INTRAVENOUS

## 2021-02-17 MED ORDER — ONDANSETRON HCL 4 MG/2ML IJ SOLN
INTRAMUSCULAR | Status: DC | PRN
  Administered 2021-02-17: 4 mg via INTRAVENOUS

## 2021-02-17 MED ORDER — POLYETHYLENE GLYCOL 3350 17 G PO PACK: 17.0000 g | PACK | Freq: Every day | ORAL | Status: DC | PRN

## 2021-02-17 MED ORDER — PHENOL 1.4 % MT LIQD: 1.0000 | OROMUCOSAL | Status: DC | PRN

## 2021-02-17 MED ORDER — ONDANSETRON HCL 4 MG PO TABS: 4.0000 mg | ORAL_TABLET | Freq: Four times a day (QID) | ORAL | Status: DC | PRN

## 2021-02-17 MED ORDER — LACTATED RINGERS IV SOLN: INTRAVENOUS | Status: DC

## 2021-02-17 MED ORDER — BUPIVACAINE-EPINEPHRINE 0.5% -1:200000 IJ SOLN
INTRAMUSCULAR | Status: DC | PRN
  Administered 2021-02-17: 4 mL

## 2021-02-17 MED ORDER — ACETAMINOPHEN 10 MG/ML IV SOLN
INTRAVENOUS | Status: AC
  Filled 2021-02-17: qty 100

## 2021-02-17 MED ORDER — ONDANSETRON HCL 4 MG/2ML IJ SOLN: 4.0000 mg | Freq: Four times a day (QID) | INTRAMUSCULAR | Status: DC | PRN

## 2021-02-17 MED ORDER — HYDROCODONE-ACETAMINOPHEN 5-325 MG PO TABS
1.0000 | ORAL_TABLET | ORAL | Status: DC | PRN
  Administered 2021-02-17 – 2021-02-18 (×4): 1 via ORAL
  Filled 2021-02-17 (×4): qty 1

## 2021-02-17 MED ORDER — LIDOCAINE 2% (20 MG/ML) 5 ML SYRINGE
INTRAMUSCULAR | Status: DC | PRN
  Administered 2021-02-17: 100 mg via INTRAVENOUS

## 2021-02-17 MED ORDER — ACETAMINOPHEN 325 MG PO TABS
650.0000 mg | ORAL_TABLET | ORAL | Status: DC | PRN
Start: 2021-02-17 — End: 2021-02-18

## 2021-02-17 MED ORDER — THROMBIN 20000 UNITS EX SOLR
CUTANEOUS | Status: AC
  Filled 2021-02-17: qty 20000

## 2021-02-17 MED ORDER — FLUOXETINE HCL 20 MG PO CAPS
20.0000 mg | ORAL_CAPSULE | Freq: Every day | ORAL | Status: DC
  Administered 2021-02-18: 20 mg via ORAL
  Filled 2021-02-17: qty 1

## 2021-02-17 MED ORDER — ONDANSETRON HCL 4 MG/2ML IJ SOLN: 4.0000 mg | Freq: Once | INTRAMUSCULAR | Status: DC | PRN

## 2021-02-17 MED ORDER — PROPOFOL 10 MG/ML IV BOLUS
INTRAVENOUS | Status: DC | PRN
  Administered 2021-02-17: 120 mg via INTRAVENOUS

## 2021-02-17 MED ORDER — CEFAZOLIN SODIUM-DEXTROSE 2-4 GM/100ML-% IV SOLN
INTRAVENOUS | Status: AC
  Filled 2021-02-17: qty 100

## 2021-02-17 MED ORDER — HYDROMORPHONE HCL 1 MG/ML IJ SOLN
INTRAMUSCULAR | Status: AC
  Filled 2021-02-17: qty 1

## 2021-02-17 MED ORDER — ALUM & MAG HYDROXIDE-SIMETH 200-200-20 MG/5ML PO SUSP: 30.0000 mL | Freq: Four times a day (QID) | ORAL | Status: DC | PRN

## 2021-02-17 MED ORDER — CEFAZOLIN SODIUM-DEXTROSE 2-4 GM/100ML-% IV SOLN
2.0000 g | INTRAVENOUS | Status: AC
  Administered 2021-02-17: 2 g via INTRAVENOUS

## 2021-02-17 MED ORDER — ACETAMINOPHEN 10 MG/ML IV SOLN
1000.0000 mg | INTRAVENOUS | Status: AC
  Administered 2021-02-17: 1000 mg via INTRAVENOUS

## 2021-02-17 MED ORDER — 0.9 % SODIUM CHLORIDE (POUR BTL) OPTIME
TOPICAL | Status: DC | PRN
  Administered 2021-02-17: 1000 mL

## 2021-02-17 MED ORDER — MAGNESIUM CITRATE PO SOLN: 1.0000 | Freq: Once | ORAL | Status: DC | PRN

## 2021-02-17 MED ORDER — FENTANYL CITRATE (PF) 250 MCG/5ML IJ SOLN
INTRAMUSCULAR | Status: DC | PRN
  Administered 2021-02-17: 100 ug via INTRAVENOUS
  Administered 2021-02-17: 25 ug via INTRAVENOUS
  Administered 2021-02-17: 75 ug via INTRAVENOUS

## 2021-02-17 MED ORDER — TRANEXAMIC ACID-NACL 1000-0.7 MG/100ML-% IV SOLN
1000.0000 mg | INTRAVENOUS | Status: AC
  Administered 2021-02-17: 1000 mg via INTRAVENOUS

## 2021-02-17 MED ORDER — FENTANYL CITRATE (PF) 250 MCG/5ML IJ SOLN
INTRAMUSCULAR | Status: AC
  Filled 2021-02-17: qty 5

## 2021-02-17 MED ORDER — THROMBIN 20000 UNITS EX SOLR: CUTANEOUS | Status: DC | PRN

## 2021-02-17 MED ORDER — CEFAZOLIN SODIUM-DEXTROSE 2-4 GM/100ML-% IV SOLN
2.0000 g | Freq: Three times a day (TID) | INTRAVENOUS | Status: DC
  Administered 2021-02-17 (×2): 2 g via INTRAVENOUS
  Filled 2021-02-17 (×2): qty 100

## 2021-02-17 MED ORDER — METHOCARBAMOL 1000 MG/10ML IJ SOLN
500.0000 mg | Freq: Four times a day (QID) | INTRAMUSCULAR | Status: DC | PRN
  Filled 2021-02-17: qty 5

## 2021-02-17 MED ORDER — METHOCARBAMOL 500 MG PO TABS
ORAL_TABLET | ORAL | Status: AC
  Filled 2021-02-17: qty 1

## 2021-02-17 MED ORDER — HYDROMORPHONE HCL 1 MG/ML IJ SOLN
0.2500 mg | INTRAMUSCULAR | Status: DC | PRN
  Administered 2021-02-17: 0.25 mg via INTRAVENOUS

## 2021-02-17 MED ORDER — METHOCARBAMOL 500 MG PO TABS
500.0000 mg | ORAL_TABLET | Freq: Four times a day (QID) | ORAL | Status: DC | PRN
  Administered 2021-02-17 – 2021-02-18 (×3): 500 mg via ORAL
  Filled 2021-02-17 (×2): qty 1

## 2021-02-17 MED ORDER — TRANEXAMIC ACID-NACL 1000-0.7 MG/100ML-% IV SOLN
INTRAVENOUS | Status: AC
  Filled 2021-02-17: qty 100

## 2021-02-17 MED ORDER — ALPRAZOLAM 0.5 MG PO TABS: 0.5000 mg | ORAL_TABLET | Freq: Every evening | ORAL | Status: DC | PRN

## 2021-02-17 MED ORDER — CHLORHEXIDINE GLUCONATE 0.12 % MT SOLN
15.0000 mL | Freq: Once | OROMUCOSAL | Status: AC
  Administered 2021-02-17: 15 mL via OROMUCOSAL
  Filled 2021-02-17: qty 15

## 2021-02-17 MED ORDER — DEXAMETHASONE SODIUM PHOSPHATE 10 MG/ML IJ SOLN
INTRAMUSCULAR | Status: DC | PRN
  Administered 2021-02-17: 10 mg via INTRAVENOUS

## 2021-02-17 MED ORDER — ORAL CARE MOUTH RINSE: 15.0000 mL | Freq: Once | OROMUCOSAL | Status: AC

## 2021-02-17 MED ORDER — BUPIVACAINE-EPINEPHRINE 0.5% -1:200000 IJ SOLN
INTRAMUSCULAR | Status: AC
  Filled 2021-02-17: qty 1

## 2021-02-17 MED ORDER — ACETAMINOPHEN 650 MG RE SUPP: 650.0000 mg | RECTAL | Status: DC | PRN

## 2021-02-17 MED ORDER — SUGAMMADEX SODIUM 200 MG/2ML IV SOLN
INTRAVENOUS | Status: DC | PRN
  Administered 2021-02-17: 200 mg via INTRAVENOUS

## 2021-02-17 MED ORDER — DOCUSATE SODIUM 100 MG PO CAPS
100.0000 mg | ORAL_CAPSULE | Freq: Two times a day (BID) | ORAL | Status: DC
  Administered 2021-02-18: 100 mg via ORAL
  Filled 2021-02-17 (×2): qty 1

## 2021-02-17 SURGICAL SUPPLY — 60 items
BAG DECANTER FOR FLEXI CONT (MISCELLANEOUS) ×1 IMPLANT
BAND INSRT 18 STRL LF DISP RB (MISCELLANEOUS) ×2
BAND RUBBER #18 3X1/16 STRL (MISCELLANEOUS) ×4 IMPLANT
BUR EGG ELITE 5.0 (BURR) ×1 IMPLANT
CNTNR URN SCR LID CUP LEK RST (MISCELLANEOUS) ×1 IMPLANT
CONT SPEC 4OZ STRL OR WHT (MISCELLANEOUS) ×2
COVER WAND RF STERILE (DRAPES) ×1 IMPLANT
DRAPE LAPAROTOMY 100X72X124 (DRAPES) ×2 IMPLANT
DRAPE MICROSCOPE LEICA (MISCELLANEOUS) ×2 IMPLANT
DRAPE SHEET LG 3/4 BI-LAMINATE (DRAPES) ×2 IMPLANT
DRAPE SURG 17X11 SM STRL (DRAPES) ×2 IMPLANT
DRAPE UTILITY XL STRL (DRAPES) ×2 IMPLANT
DRSG AQUACEL AG ADV 3.5X 4 (GAUZE/BANDAGES/DRESSINGS) IMPLANT
DRSG AQUACEL AG ADV 3.5X 6 (GAUZE/BANDAGES/DRESSINGS) ×1 IMPLANT
DRSG TEGADERM 2-3/8X2-3/4 SM (GAUZE/BANDAGES/DRESSINGS) ×1 IMPLANT
DRSG TELFA 3X8 NADH (GAUZE/BANDAGES/DRESSINGS) IMPLANT
DURAPREP 26ML APPLICATOR (WOUND CARE) ×2 IMPLANT
DURASEAL SPINE SEALANT 3ML (MISCELLANEOUS) IMPLANT
ELECT BLADE 4.0 EZ CLEAN MEGAD (MISCELLANEOUS) ×2
ELECT REM PT RETURN 9FT ADLT (ELECTROSURGICAL) ×2
ELECTRODE BLDE 4.0 EZ CLN MEGD (MISCELLANEOUS) IMPLANT
ELECTRODE REM PT RTRN 9FT ADLT (ELECTROSURGICAL) ×1 IMPLANT
EVACUATOR 1/8 PVC DRAIN (DRAIN) ×1 IMPLANT
GLOVE SRG 8 PF TXTR STRL LF DI (GLOVE) IMPLANT
GLOVE SURG SS PI 7.5 STRL IVOR (GLOVE) ×2 IMPLANT
GLOVE SURG SS PI 8.0 STRL IVOR (GLOVE) ×4 IMPLANT
GLOVE SURG UNDER POLY LF SZ7 (GLOVE) ×2 IMPLANT
GLOVE SURG UNDER POLY LF SZ8 (GLOVE) ×8
GOWN STRL REUS W/ TWL LRG LVL3 (GOWN DISPOSABLE) ×1 IMPLANT
GOWN STRL REUS W/ TWL XL LVL3 (GOWN DISPOSABLE) ×1 IMPLANT
GOWN STRL REUS W/TWL 2XL LVL3 (GOWN DISPOSABLE) ×1 IMPLANT
GOWN STRL REUS W/TWL LRG LVL3 (GOWN DISPOSABLE) ×2
GOWN STRL REUS W/TWL XL LVL3 (GOWN DISPOSABLE) ×2
IV CATH 14GX2 1/4 (CATHETERS) ×2 IMPLANT
KIT BASIN OR (CUSTOM PROCEDURE TRAY) ×2 IMPLANT
KIT POSITION SURG JACKSON T1 (MISCELLANEOUS) IMPLANT
NDL SPNL 18GX3.5 QUINCKE PK (NEEDLE) ×2 IMPLANT
NEEDLE 22X1 1/2 (OR ONLY) (NEEDLE) ×2 IMPLANT
NEEDLE SPNL 18GX3.5 QUINCKE PK (NEEDLE) ×4 IMPLANT
PACK LAMINECTOMY NEURO (CUSTOM PROCEDURE TRAY) ×2 IMPLANT
PAD DRESSING TELFA 3X8 NADH (GAUZE/BANDAGES/DRESSINGS) IMPLANT
PATTIES SURGICAL .75X.75 (GAUZE/BANDAGES/DRESSINGS) ×2 IMPLANT
SPONGE GAUZE 2X2 8PLY STRL LF (GAUZE/BANDAGES/DRESSINGS) ×1 IMPLANT
SPONGE LAP 4X18 RFD (DISPOSABLE) IMPLANT
SPONGE SURGIFOAM ABS GEL 100 (HEMOSTASIS) ×2 IMPLANT
STAPLER VISISTAT (STAPLE) ×1 IMPLANT
STRIP CLOSURE SKIN 1/2X4 (GAUZE/BANDAGES/DRESSINGS) ×2 IMPLANT
SUT NURALON 4 0 TR CR/8 (SUTURE) IMPLANT
SUT PROLENE 3 0 PS 2 (SUTURE) IMPLANT
SUT VIC AB 1 CT1 27 (SUTURE) ×4
SUT VIC AB 1 CT1 27XBRD ANTBC (SUTURE) IMPLANT
SUT VIC AB 1-0 CT2 27 (SUTURE) IMPLANT
SUT VIC AB 2-0 CT1 27 (SUTURE)
SUT VIC AB 2-0 CT1 TAPERPNT 27 (SUTURE) IMPLANT
SUT VIC AB 2-0 CT2 27 (SUTURE) ×2 IMPLANT
SYR 3ML LL SCALE MARK (SYRINGE) ×2 IMPLANT
TOWEL GREEN STERILE (TOWEL DISPOSABLE) ×2 IMPLANT
TOWEL GREEN STERILE FF (TOWEL DISPOSABLE) ×2 IMPLANT
TRAY FOLEY MTR SLVR 16FR STAT (SET/KITS/TRAYS/PACK) ×2 IMPLANT
YANKAUER SUCT BULB TIP NO VENT (SUCTIONS) ×2 IMPLANT

## 2021-02-17 NOTE — Brief Op Note (Signed)
02/17/2021  10:02 AM  PATIENT:  Lisa Oconnell  80 y.o. female  PRE-OPERATIVE DIAGNOSIS:  Stenosis Lumbar two-three, Lumbar three-four  POST-OPERATIVE DIAGNOSIS:  Stenosis Lumbar two-three, Lumbar three-four  PROCEDURE:  Procedure(s): Microlumbar Decompression Lumbar one-two, Lumbar two-three, Lumbar three-four (N/A)  SURGEON:  Surgeon(s) and Role:    Susa Day, MD - Primary  PHYSICIAN ASSISTANT:   ASSISTANTS: Bissell   ANESTHESIA:   general  EBL:  40  BLOOD ADMINISTERED:none  DRAINS: hemovac  LOCAL MEDICATIONS USED:  MARCAINE     SPECIMEN:  No Specimen  DISPOSITION OF SPECIMEN:  N/A  COUNTS:  YES  TOURNIQUET:  * No tourniquets in log *  DICTATION: .Other Dictation: Dictation Number [id number]  PLAN OF CARE: Admit for overnight observation  PATIENT DISPOSITION:  PACU - hemodynamically stable.   Delay start of Pharmacological VTE agent (>24hrs) due to surgical blood loss or risk of bleeding: yes

## 2021-02-17 NOTE — Anesthesia Postprocedure Evaluation (Signed)
Anesthesia Post Note  Patient: Lisa Oconnell  Procedure(s) Performed: Microlumbar Decompression Lumbar one-two, Lumbar two-three, Lumbar three-four (N/A Back)     Patient location during evaluation: PACU Anesthesia Type: General Level of consciousness: awake and alert Pain management: pain level controlled Vital Signs Assessment: post-procedure vital signs reviewed and stable Respiratory status: spontaneous breathing, nonlabored ventilation, respiratory function stable and patient connected to nasal cannula oxygen Cardiovascular status: blood pressure returned to baseline and stable Postop Assessment: no apparent nausea or vomiting Anesthetic complications: no   No complications documented.  Last Vitals:  Vitals:   02/17/21 1059 02/17/21 1116  BP: (!) 100/52 (!) 106/57  Pulse: 79 74  Resp: 16 16  Temp: 36.7 C 36.4 C  SpO2: 99% 100%    Last Pain:  Vitals:   02/17/21 1116  TempSrc: Oral  PainSc:                  Tephanie Escorcia S

## 2021-02-17 NOTE — Anesthesia Preprocedure Evaluation (Signed)
Anesthesia Evaluation  Patient identified by MRN, date of birth, ID band Patient awake    Reviewed: Allergy & Precautions, NPO status , Patient's Chart, lab work & pertinent test results  Airway Mallampati: II  TM Distance: >3 FB Neck ROM: Full    Dental no notable dental hx.    Pulmonary neg pulmonary ROS,    Pulmonary exam normal breath sounds clear to auscultation       Cardiovascular negative cardio ROS Normal cardiovascular exam Rhythm:Regular Rate:Normal     Neuro/Psych negative neurological ROS  negative psych ROS   GI/Hepatic negative GI ROS, Neg liver ROS,   Endo/Other  negative endocrine ROS  Renal/GU negative Renal ROS  negative genitourinary   Musculoskeletal negative musculoskeletal ROS (+)   Abdominal   Peds negative pediatric ROS (+)  Hematology negative hematology ROS (+)   Anesthesia Other Findings   Reproductive/Obstetrics negative OB ROS                             Anesthesia Physical Anesthesia Plan  ASA: II  Anesthesia Plan: General   Post-op Pain Management:    Induction: Intravenous  PONV Risk Score and Plan: Ondansetron, Dexamethasone and Treatment may vary due to age or medical condition  Airway Management Planned: Oral ETT  Additional Equipment:   Intra-op Plan:   Post-operative Plan: Extubation in OR  Informed Consent: I have reviewed the patients History and Physical, chart, labs and discussed the procedure including the risks, benefits and alternatives for the proposed anesthesia with the patient or authorized representative who has indicated his/her understanding and acceptance.     Dental advisory given  Plan Discussed with: CRNA and Surgeon  Anesthesia Plan Comments:         Anesthesia Quick Evaluation

## 2021-02-17 NOTE — Interval H&P Note (Signed)
History and Physical Interval Note:  02/17/2021 7:07 AM  Lisa Oconnell  has presented today for surgery, with the diagnosis of Stenosis L2-3, L3-4.  The various methods of treatment have been discussed with the patient and family. After consideration of risks, benefits and other options for treatment, the patient has consented to  Procedure(s) with comments: Microlumbar Decompression L2-3, possible L3-4, L1-2 (N/A) - 2.5 hrs as a surgical intervention.  The patient's history has been reviewed, patient examined, no change in status, stable for surgery.  I have reviewed the patient's chart and labs.  Questions were answered to the patient's satisfaction.     Johnn Hai

## 2021-02-17 NOTE — Transfer of Care (Signed)
Immediate Anesthesia Transfer of Care Note  Patient: Lisa Oconnell  Procedure(s) Performed: Microlumbar Decompression Lumbar one-two, Lumbar two-three, Lumbar three-four (N/A Back)  Patient Location: PACU  Anesthesia Type:General  Level of Consciousness: awake, alert  and patient cooperative  Airway & Oxygen Therapy: Patient Spontanous Breathing  Post-op Assessment: Report given to RN and Post -op Vital signs reviewed and stable  Post vital signs: Reviewed and stable  Last Vitals:  Vitals Value Taken Time  BP 112/65 02/17/21 1014  Temp    Pulse 85 02/17/21 1015  Resp 10 02/17/21 1015  SpO2 99 % 02/17/21 1015  Vitals shown include unvalidated device data.  Last Pain:  Vitals:   02/17/21 0619  TempSrc:   PainSc: 0-No pain         Complications: No complications documented.

## 2021-02-17 NOTE — Anesthesia Procedure Notes (Signed)
Procedure Name: Intubation Date/Time: 02/17/2021 7:36 AM Performed by: Janace Litten, CRNA Pre-anesthesia Checklist: Patient identified, Emergency Drugs available, Suction available and Patient being monitored Patient Re-evaluated:Patient Re-evaluated prior to induction Oxygen Delivery Method: Circle System Utilized Preoxygenation: Pre-oxygenation with 100% oxygen Induction Type: IV induction Ventilation: Mask ventilation without difficulty Laryngoscope Size: Mac and 3 Grade View: Grade I Tube type: Oral Tube size: 7.0 mm Number of attempts: 1 Airway Equipment and Method: Stylet Placement Confirmation: ETT inserted through vocal cords under direct vision,  positive ETCO2 and breath sounds checked- equal and bilateral Secured at: 21 cm Tube secured with: Tape Dental Injury: Teeth and Oropharynx as per pre-operative assessment

## 2021-02-18 ENCOUNTER — Encounter (HOSPITAL_COMMUNITY): Payer: Self-pay | Admitting: Specialist

## 2021-02-18 DIAGNOSIS — Z882 Allergy status to sulfonamides status: Secondary | ICD-10-CM | POA: Diagnosis not present

## 2021-02-18 DIAGNOSIS — M5136 Other intervertebral disc degeneration, lumbar region: Secondary | ICD-10-CM | POA: Diagnosis not present

## 2021-02-18 DIAGNOSIS — M5126 Other intervertebral disc displacement, lumbar region: Secondary | ICD-10-CM | POA: Diagnosis not present

## 2021-02-18 DIAGNOSIS — M47819 Spondylosis without myelopathy or radiculopathy, site unspecified: Secondary | ICD-10-CM | POA: Diagnosis not present

## 2021-02-18 DIAGNOSIS — M2578 Osteophyte, vertebrae: Secondary | ICD-10-CM | POA: Diagnosis not present

## 2021-02-18 DIAGNOSIS — M48062 Spinal stenosis, lumbar region with neurogenic claudication: Secondary | ICD-10-CM | POA: Diagnosis not present

## 2021-02-18 DIAGNOSIS — M5137 Other intervertebral disc degeneration, lumbosacral region: Secondary | ICD-10-CM | POA: Diagnosis not present

## 2021-02-18 DIAGNOSIS — M461 Sacroiliitis, not elsewhere classified: Secondary | ICD-10-CM | POA: Diagnosis not present

## 2021-02-18 MED ORDER — DOCUSATE SODIUM 100 MG PO CAPS: 100.0000 mg | ORAL_CAPSULE | Freq: Two times a day (BID) | ORAL | 0 refills | Status: DC | PRN

## 2021-02-18 MED ORDER — POLYETHYLENE GLYCOL 3350 17 G PO PACK: 17.0000 g | PACK | Freq: Every day | ORAL | 0 refills | Status: DC | PRN

## 2021-02-18 MED ORDER — HYDROCODONE-ACETAMINOPHEN 5-325 MG PO TABS: 1.0000 | ORAL_TABLET | ORAL | 0 refills | Status: DC | PRN

## 2021-02-18 NOTE — Discharge Summary (Signed)
Physician Discharge Summary   Patient ID: Lisa Oconnell MRN: [id number] DOB/AGE: 79-Jul-1942 79 y.o.  Admit date: 02/17/2021 Discharge date: 02/18/2021  Primary Diagnosis:   Stenosis Lumbar two-three, Lumbar three-four  Admission Diagnoses:  Past Medical History:  Diagnosis Date  . Allergy    year round  . Anxiety   . Arthritis    per pt/ not aware of it.  . Cataract    bilateral cataract extraction. not sure if lens were implanted.  . Colon polyp 01/2006   adenomatous  . Depression   . Hyperlipidemia   . Tuberculosis 1966   treated   Discharge Diagnoses:   Active Problems:   HNP (herniated nucleus pulposus), lumbar  Procedure:  Procedure(s) (LRB): Microlumbar Decompression Lumbar one-two, Lumbar two-three, Lumbar three-four (N/A)   Consults: None  HPI:  see H&P    Laboratory Data: Hospital Outpatient Visit on 02/16/2021  Component Date Value Ref Range Status  . WBC 02/16/2021 5.4  4.0 - 10.5 K/uL Final  . RBC 02/16/2021 4.27  3.87 - 5.11 MIL/uL Final  . Hemoglobin 02/16/2021 13.7  12.0 - 15.0 g/dL Final  . HCT 02/16/2021 41.1  36.0 - 46.0 % Final  . MCV 02/16/2021 96.3  80.0 - 100.0 fL Final  . MCH 02/16/2021 32.1  26.0 - 34.0 pg Final  . MCHC 02/16/2021 33.3  30.0 - 36.0 g/dL Final  . RDW 02/16/2021 13.4  11.5 - 15.5 % Final  . Platelets 02/16/2021 391  150 - 400 K/uL Final  . nRBC 02/16/2021 0.0  0.0 - 0.2 % Final   Performed at [facility], [place] [address] [place], [place] [address]  . Sodium 02/16/2021 137  135 - 145 mmol/L Final  . Potassium 02/16/2021 3.6  3.5 - 5.1 mmol/L Final  . Chloride 02/16/2021 104  98 - 111 mmol/L Final  . CO2 02/16/2021 27  22 - 32 mmol/L Final  . Glucose, Bld 02/16/2021 80  70 - 99 mg/dL Final   Glucose reference range applies only to samples taken after fasting for at least 8 hours.  . BUN 02/16/2021 17  8 - 23 mg/dL Final  . Creatinine, Ser 02/16/2021 0.77  0.44 - 1.00 mg/dL Final  . Calcium 02/16/2021 9.3  8.9 -  10.3 mg/dL Final  . Total Protein 02/16/2021 6.6  6.5 - 8.1 g/dL Final  . Albumin 02/16/2021 3.8  3.5 - 5.0 g/dL Final  . AST 02/16/2021 20  15 - 41 U/L Final  . ALT 02/16/2021 18  0 - 44 U/L Final  . Alkaline Phosphatase 02/16/2021 74  38 - 126 U/L Final  . Total Bilirubin 02/16/2021 0.9  0.3 - 1.2 mg/dL Final  . GFR, Estimated 02/16/2021 >60  >60 mL/min Final   Comment: (NOTE) Calculated using the CKD-EPI Creatinine Equation (2021)   . Anion gap 02/16/2021 6  5 - 15 Final   Performed at [facility] [address] [place], [place] [address]  . MRSA, PCR 02/16/2021 NEGATIVE  NEGATIVE Final  . Staphylococcus aureus 02/16/2021 NEGATIVE  NEGATIVE Final   Comment: (NOTE) The Xpert SA Assay (FDA approved for NASAL specimens in patients 54 years of age and older), is one component of a comprehensive surveillance program. It is not intended to diagnose infection nor to guide or monitor treatment. Performed at [facility], [place] [address] [place], [place] [address]   . SARS Coronavirus 2 02/16/2021 NEGATIVE  NEGATIVE Final   Comment: (NOTE) SARS-CoV-2 target  nucleic acids are NOT DETECTED.  The SARS-CoV-2 RNA is generally detectable in upper and lower respiratory specimens during the acute phase of infection. Negative results do not preclude SARS-CoV-2 infection, do not rule out co-infections with other pathogens, and should not be used as the sole basis for treatment or other patient management decisions. Negative results must be combined with clinical observations, patient history, and epidemiological information. The expected result is Negative.  Fact Sheet for Patients: SugarRoll.be  Fact Sheet for Healthcare Providers: https://www.woods-mathews.com/  This test is not yet approved or cleared by the [place] FDA and  has been authorized for detection and/or diagnosis of SARS-CoV-2 by FDA under an Emergency Use  Authorization (EUA). This EUA will remain  in effect (meaning this test can be used) for the duration of the COVID-19 declaration under Se                          ction 564(b)(1) of the Act, 21 U.S.C. section 360bbb-3(b)(1), unless the authorization is terminated or revoked sooner.  Performed at [facility], [place] [address], [place], [place] [address]    Recent Labs    02/16/21 1022  HGB 13.7   Recent Labs    02/16/21 1022  WBC 5.4  RBC 4.27  HCT 41.1  PLT 391   Recent Labs    02/16/21 1022  NA 137  K 3.6  CL 104  CO2 27  BUN 17  CREATININE 0.77  GLUCOSE 80  CALCIUM 9.3   No results for input(s): LABPT, INR in the last 72 hours.  X-Rays:DG Lumbar Spine 2-3 Views  Result Date: 02/17/2021 CLINICAL DATA:  Preop for lumbar surgery. EXAM: LUMBAR SPINE - 2-3 VIEW COMPARISON:  September 06, 2005. FINDINGS: No fracture or spondylolisthesis is noted. Moderate to severe degenerative disc disease is noted at all levels of the lumbar spine with anterior osteophyte formation. IMPRESSION: Multilevel degenerative disc disease as described above. No acute abnormality is noted. Electronically Signed   By: Marijo Conception M.D.   On: 02/17/2021 10:14   DG Lumbar Spine 2-3 Views  Result Date: 02/17/2021 CLINICAL DATA:  Intraoperative localization EXAM: LUMBAR SPINE - 2-3 VIEW COMPARISON:  02/16/2021 FINDINGS: 1st image demonstrates posterior needles directed at the L3-4 and L4-5 disc spaces. Second image demonstrates posterior surgical instruments directed at the L2 vertebral body and L3-4 disc space. Third image demonstrates posterior surgical instruments overlying the L2 and L3 pedicles. IMPRESSION: Intraoperative localization as above. Electronically Signed   By: Rolm Baptise M.D.   On: 02/17/2021 09:58   CT LUMBAR SPINE W CONTRAST  Result Date: 01/19/2021 CLINICAL DATA:  Left-sided low back pain with some radiation to the left leg. EXAM: LUMBAR MYELOGRAM FLUOROSCOPY TIME:  1  minutes 2 seconds. 155.34 micro gray meter squared PROCEDURE: After thorough discussion of risks and benefits of the procedure including bleeding, infection, injury to nerves, blood vessels, adjacent structures as well as headache and CSF leak, written and oral informed consent was obtained. Consent was obtained by Dr. Nelson Chimes. Time out form was completed. Patient was positioned prone on the fluoroscopy table. Local anesthesia was provided with 1% lidocaine without epinephrine after prepped and draped in the usual sterile fashion. Puncture was performed at L3-4 using a 3 1/2 inch 22-gauge spinal needle via left paramedian approach. Using a single pass through the dura, the needle was placed within the thecal sac, with return of clear CSF. 15 mL of  Isovue M-200 was injected into the thecal sac, with normal opacification of the nerve roots and cauda equina consistent with free flow within the subarachnoid space. I personally performed the lumbar puncture and administered the intrathecal contrast. I also personally performed acquisition of the myelogram images. TECHNIQUE: Contiguous axial images were obtained through the Lumbar spine after the intrathecal infusion of infusion. Coronal and sagittal reconstructions were obtained of the axial image sets. COMPARISON:  None FINDINGS: LUMBAR MYELOGRAM FINDINGS: There are 5 lumbar type vertebral bodies. No significant scoliotic curvature. There are small anterior extradural defects from L1-2 through L5-S1. This is largest at the L2-3 level. Mild canal narrowing is present at L1-2. Moderate to severe canal narrowing is present at L2-3, left worse than right. Bilateral lateral recess narrowing at present L3-4 and L4-5. No compressive finding at L5-S1. Standing lateral flexion extension views do not show any subluxation. Right-to-left bending views not demonstrate any significant new finding. CT LUMBAR MYELOGRAM FINDINGS: T12-L1: No disc abnormality. Minimal ligamentous  prominence. No stenosis. L1-2: Chronic disc degeneration with loss of disc height. Endplate osteophytes and bulging of the disc. Mild facet and ligamentous prominence. Mild stenosis of the canal and lateral recesses but without definite neural compression. L2-3: Disc degeneration with loss of disc height. Endplate osteophytes and circumferential protrusion of the disc. Facet and ligamentous prominence. Severe multifactorial stenosis at this level that could cause neural compression on either or both sides. L3-4: Disc degeneration with loss of disc height. Endplate osteophytes and bulging of the disc. Mild facet and ligamentous hypertrophy. Stenosis of the lateral recesses left more than right. Neural compression could occur at this level, particularly in the left lateral recess. L4-5: Disc degeneration with loss of disc height. Endplate osteophytes and mild bulging of the disc. Mild facet hypertrophy. Mild stenosis of both lateral recesses but without apparent neural compression. L5-S1: Disc degeneration with loss of disc height. Endplate osteophytes and bulging of the disc. No compressive narrowing of the canal or foramina. There is only mild facet osteoarthritis. Minimal osteoarthritis of both sacroiliac joints. IMPRESSION: 1. L2-3: Moderate to severe multifactorial spinal stenosis likely to cause neural compression. Contributing factors include circumferential protrusion of the disc in combination with facet and ligamentous hypertrophy. 2. L3-4: Left lateral recess stenosis that could cause neural compression. Endplate osteophytes and bulging of the disc more prominent towards the left. Mild facet and ligamentous hypertrophy. 3. L1-2 and L4-5: Chronic disc degeneration with endplate osteophytes and bulging of the disc. Mild facet and ligamentous hypertrophy. Mild stenosis the lateral recesses at these 2 levels but no definite neural compression. 4. L5-S1: Chronic disc degeneration with loss of height. No canal or  foraminal stenosis. Mild facet osteoarthritis. 5. Mild bilateral sacroiliac osteoarthritis. 6. No abnormal motion demonstrated with either right-to-left bending or flexion extension. Electronically Signed   By: Nelson Chimes M.D.   On: 01/19/2021 14:16   DG MYELOGRAPHY LUMBAR INJ LUMBOSACRAL  Result Date: 01/19/2021 CLINICAL DATA:  Left-sided low back pain with some radiation to the left leg. EXAM: LUMBAR MYELOGRAM FLUOROSCOPY TIME:  1 minutes 2 seconds. 155.34 micro gray meter squared PROCEDURE: After thorough discussion of risks and benefits of the procedure including bleeding, infection, injury to nerves, blood vessels, adjacent structures as well as headache and CSF leak, written and oral informed consent was obtained. Consent was obtained by Dr. Nelson Chimes. Time out form was completed. Patient was positioned prone on the fluoroscopy table. Local anesthesia was provided with 1% lidocaine without epinephrine after prepped and draped  in the usual sterile fashion. Puncture was performed at L3-4 using a 3 1/2 inch 22-gauge spinal needle via left paramedian approach. Using a single pass through the dura, the needle was placed within the thecal sac, with return of clear CSF. 15 mL of Isovue M-200 was injected into the thecal sac, with normal opacification of the nerve roots and cauda equina consistent with free flow within the subarachnoid space. I personally performed the lumbar puncture and administered the intrathecal contrast. I also personally performed acquisition of the myelogram images. TECHNIQUE: Contiguous axial images were obtained through the Lumbar spine after the intrathecal infusion of infusion. Coronal and sagittal reconstructions were obtained of the axial image sets. COMPARISON:  None FINDINGS: LUMBAR MYELOGRAM FINDINGS: There are 5 lumbar type vertebral bodies. No significant scoliotic curvature. There are small anterior extradural defects from L1-2 through L5-S1. This is largest at the L2-3  level. Mild canal narrowing is present at L1-2. Moderate to severe canal narrowing is present at L2-3, left worse than right. Bilateral lateral recess narrowing at present L3-4 and L4-5. No compressive finding at L5-S1. Standing lateral flexion extension views do not show any subluxation. Right-to-left bending views not demonstrate any significant new finding. CT LUMBAR MYELOGRAM FINDINGS: T12-L1: No disc abnormality. Minimal ligamentous prominence. No stenosis. L1-2: Chronic disc degeneration with loss of disc height. Endplate osteophytes and bulging of the disc. Mild facet and ligamentous prominence. Mild stenosis of the canal and lateral recesses but without definite neural compression. L2-3: Disc degeneration with loss of disc height. Endplate osteophytes and circumferential protrusion of the disc. Facet and ligamentous prominence. Severe multifactorial stenosis at this level that could cause neural compression on either or both sides. L3-4: Disc degeneration with loss of disc height. Endplate osteophytes and bulging of the disc. Mild facet and ligamentous hypertrophy. Stenosis of the lateral recesses left more than right. Neural compression could occur at this level, particularly in the left lateral recess. L4-5: Disc degeneration with loss of disc height. Endplate osteophytes and mild bulging of the disc. Mild facet hypertrophy. Mild stenosis of both lateral recesses but without apparent neural compression. L5-S1: Disc degeneration with loss of disc height. Endplate osteophytes and bulging of the disc. No compressive narrowing of the canal or foramina. There is only mild facet osteoarthritis. Minimal osteoarthritis of both sacroiliac joints. IMPRESSION: 1. L2-3: Moderate to severe multifactorial spinal stenosis likely to cause neural compression. Contributing factors include circumferential protrusion of the disc in combination with facet and ligamentous hypertrophy. 2. L3-4: Left lateral recess stenosis that  could cause neural compression. Endplate osteophytes and bulging of the disc more prominent towards the left. Mild facet and ligamentous hypertrophy. 3. L1-2 and L4-5: Chronic disc degeneration with endplate osteophytes and bulging of the disc. Mild facet and ligamentous hypertrophy. Mild stenosis the lateral recesses at these 2 levels but no definite neural compression. 4. L5-S1: Chronic disc degeneration with loss of height. No canal or foraminal stenosis. Mild facet osteoarthritis. 5. Mild bilateral sacroiliac osteoarthritis. 6. No abnormal motion demonstrated with either right-to-left bending or flexion extension. Electronically Signed   By: Nelson Chimes M.D.   On: 01/19/2021 14:16    EKG:No orders found for this or any previous visit.   Hospital Course: Patient was admitted to [facility] and taken to the OR and underwent the above state procedure without complications.  Patient tolerated the procedure well and was later transferred to the recovery room and then to the orthopaedic floor for postoperative care.  They were given  PO and IV analgesics for pain control following their surgery.  They were given 24 hours of postoperative antibiotics.   PT was consulted postop to assist with mobility and transfers.  The patient was allowed to be WBAT with therapy and was taught back precautions. Discharge planning was consulted to help with postop disposition and equipment needs.  Patient had a good night on the evening of surgery and started to get up OOB with therapy on day one. Patient was seen in rounds and was ready to go home on day one.  They were given discharge instructions and dressing directions.  They were instructed on when to follow up in the office with Dr. Tonita Cong.   Diet: Regular diet Activity:WBAT, LSpine precautions Follow-up:in 10-14 days Disposition - Home Discharged Condition: good   Discharge Instructions    Call MD / Call 911   Complete by: As directed    If you  experience chest pain or shortness of breath, CALL 911 and be transported to the hospital emergency room.  If you develope a fever above 101 F, pus (white drainage) or increased drainage or redness at the wound, or calf pain, call your surgeon's office.   Constipation Prevention   Complete by: As directed    Drink plenty of fluids.  Prune juice may be helpful.  You may use a stool softener, such as Colace (over the counter) 100 mg twice a day.  Use MiraLax (over the counter) for constipation as needed.   Diet - low sodium heart healthy   Complete by: As directed    Increase activity slowly as tolerated   Complete by: As directed      Allergies as of 02/18/2021      Reactions   Sulfa Antibiotics Swelling   Mouth blistered,       Medication List    TAKE these medications   acetaminophen 500 MG tablet Commonly known as: TYLENOL Take 1,000 mg by mouth at bedtime.   ALPRAZolam 0.5 MG tablet Commonly known as: XANAX Take 0.5 mg by mouth at bedtime as needed for anxiety or sleep.   docusate sodium 100 MG capsule Commonly known as: COLACE Take 1 capsule (100 mg total) by mouth 2 (two) times daily as needed for mild constipation.   FLUoxetine 20 MG capsule Commonly known as: PROZAC Take 20 mg by mouth daily.   HYDROcodone-acetaminophen 5-325 MG tablet Commonly known as: NORCO/VICODIN Take 1 tablet by mouth every 4 (four) hours as needed for moderate pain ((score 4 to 6)).   polyethylene glycol 17 g packet Commonly known as: MIRALAX / GLYCOLAX Take 17 g by mouth daily as needed for mild constipation.       Follow-up Information    Susa Day, MD Follow up in 2 week(s).   Specialty: Orthopedic Surgery Contact information: [address] [place] [address] [phone]               Signed: Lacie Draft, PA-C Orthopaedic Surgery 02/18/2021, 7:45 AM

## 2021-02-18 NOTE — Evaluation (Signed)
Physical Therapy Evaluation Patient Details Name: Lisa Oconnell MRN: [id number] DOB: 04-Oct-1941 Today's Date: 02/18/2021   History of Present Illness  80 yo s/p Microlumbar Decompression Lumbar one-two, Lumbar two-three, Lumbar three-four 02/17/21. PMH includes depression, anxiety.  Clinical Impression  Patient presents with soreness and post surgical deficits s/p above surgery. Pt lives with spouse and reports being Mod I for ADLs/IADLs and walking PTA. Using [facility] PRN for community distances. Today, pt tolerated bed mobility, transfers and gait training Mod I for safety. Tolerated stair training with use of rail and no difficulties. Education re: back precautions, log roll technique, car transfer, walking recommendations, handout etc. Pt does not require further skilled therapy services as pt functioning close to baseline and has support at home. All education completed. Discharge from therapy.    Follow Up Recommendations No PT follow up    Equipment Recommendations  None recommended by PT    Recommendations for Other Services       Precautions / Restrictions Precautions Precautions: Back Precaution Booklet Issued: Yes (comment) Precaution Comments: Reviewed precautions Restrictions Weight Bearing Restrictions: No      Mobility  Bed Mobility Overal bed mobility: Modified Independent             General bed mobility comments: Cues for log roll technique, HOB flat, no use of rails to simulate home.    Transfers Overall transfer level: Modified independent Equipment used: None             General transfer comment: Stood from EOB x1, from toilet x1, transferred to chair post ambulation.  Ambulation/Gait Ambulation/Gait assistance: Independent Gait Distance (Feet): 400 Feet Assistive device: None Gait Pattern/deviations: Step-through pattern;Decreased stride length   Gait velocity interpretation: 1.31 - 2.62 ft/sec, indicative of limited community  ambulator General Gait Details: Slow, steady gait with no evidence of imbalance.  Stairs Stairs: Yes Stairs assistance: Modified independent (Device/Increase time) Stair Management: One rail Left;Alternating pattern Number of Stairs: 3 General stair comments: Cues for safety.  Wheelchair Mobility    Modified Rankin (Stroke Patients Only)       Balance Overall balance assessment: No apparent balance deficits (not formally assessed)                                           Pertinent Vitals/Pain Pain Assessment: Faces Faces Pain Scale: Hurts a little bit Pain Location: back Pain Descriptors / Indicators: Sore;Operative site guarding Pain Intervention(s): Monitored during session    Home Living Family/patient expects to be discharged to:: Private residence Living Arrangements: Spouse/significant other Available Help at Discharge: Family;Available 24 hours/day Type of Home: House Home Access: Stairs to enter Entrance Stairs-Rails: Right Entrance Stairs-Number of Steps: 3 short steps Home Layout: One level Home Equipment: Walker - 2 wheels;Cane - single point      Prior Function Level of Independence: Independent         Comments: Was using SPC PRN when walking long distances the last few weeks. 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     Hand Dominance        Extremity/Trunk Assessment   Upper Extremity Assessment Upper Extremity Assessment: Defer to OT evaluation    Lower Extremity Assessment Lower Extremity Assessment: Overall WFL for tasks assessed    Cervical / Trunk Assessment Cervical / Trunk Assessment: Other exceptions Cervical / Trunk Exceptions: s/p back surgery  Communication  Communication: No difficulties  Cognition Arousal/Alertness: Awake/alert Behavior During Therapy: WFL for tasks assessed/performed Overall Cognitive Status: Within Functional Limits for tasks assessed                                         General Comments General comments (skin integrity, edema, etc.): Bandage- clean, dry and intact.    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 PT Goal Formulation: All assessment and education complete, DC therapy    Frequency     Barriers to discharge        Co-evaluation               AM-PAC PT "6 Clicks" Mobility  Outcome Measure Help needed turning from your back to your side while in a flat bed without using bedrails?: None Help needed moving from lying on your back to sitting on the side of a flat bed without using bedrails?: None Help needed moving to and from a bed to a chair (including a wheelchair)?: None Help needed standing up from a chair using your arms (e.g., wheelchair or bedside chair)?: None Help needed to walk in hospital room?: None Help needed climbing 3-5 steps with a railing? : A Little 6 Click Score: 23    End of Session   Activity Tolerance: Patient tolerated treatment well Patient left: in chair;with call bell/phone within reach Nurse Communication: Mobility status PT Visit Diagnosis: Pain Pain - part of body:  (back)    Time: 3976-7341 PT Time Calculation (min) (ACUTE ONLY): 20 min   Charges:   PT Evaluation $PT Eval Low Complexity: 1 Low          Marisa Severin, PT, DPT Acute Rehabilitation Services Pager [phone] Office [phone]      [name] 02/18/2021, 8:14 AM

## 2021-02-18 NOTE — Discharge Instructions (Addendum)

## 2021-02-18 NOTE — Progress Notes (Signed)
Subjective: 1 Day Post-Op Procedure(s) (LRB): Microlumbar Decompression Lumbar one-two, Lumbar two-three, Lumbar three-four (N/A) Patient reports pain as mild.   Drain removed this AM. Voiding without difficulty. No c/o. No leg pain.  Objective: Vital signs in last 24 hours: Temp:  [97.6 F (36.4 C)-98.2 F (36.8 C)] 98.2 F (36.8 C) (04/15 0731) Pulse Rate:  [60-86] 64 (04/15 0731) Resp:  [10-18] 16 (04/15 0731) BP: (99-114)/(52-66) 111/62 (04/15 0731) SpO2:  [96 %-100 %] 99 % (04/15 0731)  Intake/Output from previous day: 04/14 0701 - 04/15 0700 In: 1340 [P.O.:240; I.V.:800; IV Piggyback:300] Out: 275 [Urine:200; Drains:75] Intake/Output this shift: No intake/output data recorded.  Recent Labs    02/16/21 1022  HGB 13.7   Recent Labs    02/16/21 1022  WBC 5.4  RBC 4.27  HCT 41.1  PLT 391   Recent Labs    02/16/21 1022  NA 137  K 3.6  CL 104  CO2 27  BUN 17  CREATININE 0.77  GLUCOSE 80  CALCIUM 9.3   No results for input(s): LABPT, INR in the last 72 hours.  Neurologically intact ABD soft Neurovascular intact Sensation intact distally Intact pulses distally Dorsiflexion/Plantar flexion intact Incision: dressing C/D/I and no drainage No cellulitis present Compartment soft   Assessment/Plan: 1 Day Post-Op Procedure(s) (LRB): Microlumbar Decompression Lumbar one-two, Lumbar two-three, Lumbar three-four (N/A) Advance diet Up with therapy D/C IV fluids  D/C home today Discussed D/C instructions, dressing instructions, Lspine precautions Follow up outpt 2 weeks  Cecilie Kicks 02/18/2021, 7:42 AM

## 2021-02-18 NOTE — Op Note (Signed)
NAME: Lisa Oconnell, Lisa Oconnell MEDICAL RECORD NO: [id number] ACCOUNT NO: [id number] DATE OF BIRTH: 1941/07/24 FACILITY: [facility] LOCATION: [facility] PHYSICIAN: Johnn Hai, MD  Operative Report   DATE OF PROCEDURE: 02/17/2021  PREOPERATIVE DIAGNOSIS:  Spinal stenosis at L2-L3, L1-L2, L3-L4 left.  POSTOPERATIVE DIAGNOSIS:  Spinal stenosis at L2-L3, L1-L2, L3-L4 left.  PROCEDURE PERFORMED: 1.  Microlumbar decompression centrally at L2-L3 with foraminotomies of L3. 2.  Laminectomy of L2. 3.  Decompression of L1-L2. 4.  Hemilaminotomy and decompression L3-L4, left.  ANESTHESIA:  General.  ASSISTANT:  Lacie Draft, PA  HISTORY:  This is a 80 year old female who has had left lower extremity claudication symptoms with ambulation.  Pain in the buttock and posterior thigh.  She had an MRI which indicates severe spinal stenosis at L2-L3, multifactorial, with clumping of  nerve roots.  Moderate at L1-L2.  She also had a lateral recess stenosis at L3-L4 on the left.  We discussed decompression predominantly at L2-L3 with laminectomy of L3 extending up into L1-L2 and also a hemilaminotomy at L3-L4 on the left.  Risks and  benefits were discussed including bleeding, infection, damage to neurovascular structures, no change in symptoms, worsening symptoms, DVT, PE, anesthetic complications, etc.  DESCRIPTION OF PROCEDURE:  With the patient in supine position, after induction of adequate general anesthesia, Foley to gravity, she was placed prone on the Wilson frame.  All bony prominences were well padded, Foley to gravity.  Lumbar region was  prepped and draped in the usual sterile fashion.  Two 18-gauge spinal needles were utilized to localize the L2-L3 interspace.  I made an incision from above the spinous process of L2 to below the spinous process of L4.  Subcutaneous tissue was dissected  with electrocautery utilized to achieve hemostasis.  0.25% Marcaine with epinephrine was infiltrated in the subcutaneous  tissue.  Dorsal lumbar fascia divided in line of skin incision.  Paraspinous muscle elevated from the lamina of L2-L3 and L3-L4 on  the left.  First, a Leksell rongeur was utilized to remove the spinous processes of L3 and L2, after this was confirmed by a Statistician.  Kochers on the spinous processes of L2 and L3.  After removing the spinous processes of L2 and  L3, I used a high-speed bur to thin out the lamina of L2.  I then found the inferior aspect of the facet at L2-L3 and began hemilaminotomy of the inferior process of L3.  This exposing the ligamentum flavum.  I continued then with a central laminotomy  cephalad, with a 2 mm Kerrison centrally, this cephalad to the point of attachment of the ligamentum flavum.  Hemilaminotomies are performed of the caudad edge of L2 bilaterally as well.  In a similar fashion, I performed hemilaminotomy of the caudad  edge of L2 on the right.  This exposing the ligamentum flavum and the superior articulating process of L3.  This was continued cephalad.  The pars was preserved.  Following this, I then fully detached ligamentum flavum cephalad.  Epidural fat was then  noted.  I began removing ligamentum flavum bilaterally using a Penfield, just below the ligamentum flavum, there was epidural fat noted.  I removed the ligamentum flavum both sides.  I detached ligamentum flavum from the cephalad edge of L3 utilizing the  micro curette as well.  Continued bilaterally with the decompression.  With a Woodson retractor beneath the ligamentum flavum, I performed hemilaminotomies of the cephalad edge of L3 bilaterally and foraminotomies of L3 and then bilaterally,  we  decompressed the lateral recess to the medial border of the pedicle.  Undercutting the facets, bilaterally, protecting the neural elements at all time with a Woodson retractor.  There was severe stenosis noted secondary to the ligamentum flavum  hypertrophy, epidural fat.  Following this  decompression, the [facility] retractor passed freely at the foramen of L3 and out the foramen of L2.  I then continued cephalad to remove the entire lamina of L2.  Just for a full decompression as the stenosis  appeared to be greatest at the top of the arch of L2.  In doing this, I then removed ligamentum flavum as well from the interspace of L1-L2 with a 2 mm Kerrison.  There was some hypertrophic ligamentum flavum noted here as well.  No disk herniation was  palpated.  Woodson retractor passed cephalad above the pedicle of L2.  I obtained radiographs confirming the decompression to L1-L2 and then down to the foramen of L3.  Good restoration of the thecal sac was noted here.  Bone wax was placed in all  cancellous surfaces.  Copious irrigation was used throughout and thrombin soaked Gelfoam.  Covered the thecal sac with neuro patties.  Then, I proceeded on the left at L3-L4 to perform a hemilaminotomy at the caudad edge of L3.  I then detached the  ligamentum flavum from cephalad edge of L4.  In small foraminotomy of L4 protecting the nerve root, there was ligamentum flavum hypertrophy here noted as well.  Next, Woodson retractor placed freely above the arch of L4 and below L5.  Next, bone wax was placed on the cancellous surfaces.  Copious irrigation was performed.  Valsalva was performed at 40.  No active bleeding or CSF leakage.  I then removed the [facility] retractor.  Paraspinous muscles irrigated.  No active bleeding.  We  then placed just to the extent of the decompression, Hemovac brought out through a lateral stab wound in the skin.  I then reapproximated the dorsal lumbar fascia with 1 Vicryl in interrupted figure-of-eight sutures, subcutaneous with 2-0 and skin with  staples.  Wound was dressed sterilely.  Placed supine on the hospital bed, extubated without difficulty and transported to the recovery room in satisfactory condition.  The patient tolerated the procedure well.  No complications.   Assistant, Lacie Draft, [place].  Minimal blood loss.   SHW D: 02/17/2021 10:17:00 am T: 02/18/2021 6:10:00 am  JOB: [id number]/ [id number]

## 2021-02-18 NOTE — Progress Notes (Signed)
Occupational Therapy Evaluation Completed education regarding back precautions for ADL and functional mobility for ADL. Pt able to return demonstrate. Written information reviewed. No further OT needed.     02/18/21 0814  OT Visit Information  Last OT Received On 02/18/21  Assistance Needed +1  History of Present Illness 80 yo s/p Microlumbar Decompression Lumbar one-two, Lumbar two-three, Lumbar three-four 02/17/21. PMH includes depression, anxiety.  Precautions  Precautions Back  Precaution Booklet Issued Yes (comment)  Precaution Comments Reviewed precautions  Restrictions  Weight Bearing Restrictions No  Home Living  Family/patient expects to be discharged to: Private residence  Living Arrangements Spouse/significant other  Available Help at Discharge Family;Available 24 hours/day  Type of Home House  Home Access Stairs to enter  Entrance Stairs-Number of Steps 3 short steps  Entrance Stairs-Rails Right  Home Layout One level  Bathroom Shower/Tub Walk-in shower;Tub/shower unit  [employer] Handicapped height  Bathroom Accessibility Yes  How Accessible Accessible via walker  [place] - 2 wheels;Cane - single point;Shower seat  Prior Function  Level of Independence Independent  Comments Was using SPC PRN when walking long distances the last few weeks. 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  Communication  Communication No difficulties  Pain Assessment  Pain Assessment Faces  Faces Pain Scale 2  Pain Location back  Pain Descriptors / Indicators Sore;Operative site guarding  Pain Intervention(s) Limited activity within patient's tolerance  Cognition  Arousal/Alertness Awake/alert  Behavior During Therapy WFL for tasks assessed/performed  Overall Cognitive Status Within Functional Limits for tasks assessed  Upper Extremity Assessment  Upper Extremity Assessment Overall WFL for tasks assessed  Lower Extremity Assessment  Lower  Extremity Assessment Defer to PT evaluation  Cervical / Trunk Assessment  Cervical / Trunk Assessment Other exceptions  Cervical / Trunk Exceptions s/p back surgery  ADL  Overall ADL's  Needs assistance/impaired  General ADL Comments Educated pt on compensatory strategies for ADL taks following back precuations. Recommend using shower seat adn sitting when completing UB dressing to reduce risk of falls. Able to complete figure four positioning for LB ADL. Educated on strategies to reduce risk of falls and home set up to increase safety adn maximize functional level of independence.  Bed Mobility  General bed mobility comments up in chair  Transfers  Overall transfer level Modified independent  Balance  Overall balance assessment No apparent balance deficits (not formally assessed)  OT - End of Session  Activity Tolerance Patient tolerated treatment well  Patient left in chair;with call bell/phone within reach  Nurse Communication Mobility status  OT Assessment  OT Recommendation/Assessment Patient does not need any further OT services  OT Visit Diagnosis Pain  OT Problem List Decreased knowledge of use of DME or AE;Decreased knowledge of precautions;Pain  AM-PAC OT "6 Clicks" Daily Activity Outcome Measure (Version 2)  Help from another person eating meals? 4  Help from another person taking care of personal grooming? 3  Help from another person toileting, which includes using toliet, bedpan, or urinal? 3  Help from another person bathing (including washing, rinsing, drying)? 3  Help from another person to put on and taking off regular upper body clothing? 3  Help from another person to put on and taking off regular lower body clothing? 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

## 2021-02-18 NOTE — Progress Notes (Signed)
Patient alert and oriented, voiding adequately, MAE well with no difficulty. Incision area cdi with no s/s of infection. Patient discharged home per order. Patient and husband stated understanding of discharge instructions given. Patient has an appointment with Dr. Beane 

## 2021-04-21 DIAGNOSIS — Z96651 Presence of right artificial knee joint: Secondary | ICD-10-CM | POA: Diagnosis not present

## 2021-04-21 DIAGNOSIS — M25562 Pain in left knee: Secondary | ICD-10-CM | POA: Diagnosis not present

## 2021-04-21 DIAGNOSIS — M25561 Pain in right knee: Secondary | ICD-10-CM | POA: Diagnosis not present

## 2021-08-15 DIAGNOSIS — M859 Disorder of bone density and structure, unspecified: Secondary | ICD-10-CM | POA: Diagnosis not present

## 2021-08-15 DIAGNOSIS — E785 Hyperlipidemia, unspecified: Secondary | ICD-10-CM | POA: Diagnosis not present

## 2021-08-15 DIAGNOSIS — R7301 Impaired fasting glucose: Secondary | ICD-10-CM | POA: Diagnosis not present

## 2021-08-22 ENCOUNTER — Other Ambulatory Visit: Payer: Self-pay | Admitting: Internal Medicine

## 2021-08-22 DIAGNOSIS — G4700 Insomnia, unspecified: Secondary | ICD-10-CM | POA: Diagnosis not present

## 2021-08-22 DIAGNOSIS — E785 Hyperlipidemia, unspecified: Secondary | ICD-10-CM | POA: Diagnosis not present

## 2021-08-22 DIAGNOSIS — F418 Other specified anxiety disorders: Secondary | ICD-10-CM | POA: Diagnosis not present

## 2021-08-22 DIAGNOSIS — R82998 Other abnormal findings in urine: Secondary | ICD-10-CM | POA: Diagnosis not present

## 2021-08-22 DIAGNOSIS — Z0000 Encounter for general adult medical examination without abnormal findings: Secondary | ICD-10-CM | POA: Diagnosis not present

## 2021-08-22 DIAGNOSIS — N1831 Chronic kidney disease, stage 3a: Secondary | ICD-10-CM | POA: Diagnosis not present

## 2021-08-22 DIAGNOSIS — F3342 Major depressive disorder, recurrent, in full remission: Secondary | ICD-10-CM | POA: Diagnosis not present

## 2021-08-22 DIAGNOSIS — R10821 Right upper quadrant rebound abdominal tenderness: Secondary | ICD-10-CM

## 2021-08-22 DIAGNOSIS — R69 Illness, unspecified: Secondary | ICD-10-CM | POA: Diagnosis not present

## 2021-08-22 DIAGNOSIS — R7301 Impaired fasting glucose: Secondary | ICD-10-CM | POA: Diagnosis not present

## 2021-08-22 DIAGNOSIS — Z1339 Encounter for screening examination for other mental health and behavioral disorders: Secondary | ICD-10-CM | POA: Diagnosis not present

## 2021-08-22 DIAGNOSIS — Z1331 Encounter for screening for depression: Secondary | ICD-10-CM | POA: Diagnosis not present

## 2021-08-22 DIAGNOSIS — K5909 Other constipation: Secondary | ICD-10-CM | POA: Diagnosis not present

## 2021-08-22 DIAGNOSIS — M8580 Other specified disorders of bone density and structure, unspecified site: Secondary | ICD-10-CM | POA: Diagnosis not present

## 2021-08-24 ENCOUNTER — Ambulatory Visit
Admission: RE | Admit: 2021-08-24 | Discharge: 2021-08-24 | Disposition: A | Discharge status: Home / Self Care | Payer: Medicare HMO | Source: Ambulatory Visit | Attending: Internal Medicine | Admitting: Internal Medicine

## 2021-08-24 DIAGNOSIS — K8020 Calculus of gallbladder without cholecystitis without obstruction: Secondary | ICD-10-CM | POA: Diagnosis not present

## 2021-08-24 DIAGNOSIS — R10821 Right upper quadrant rebound abdominal tenderness: Secondary | ICD-10-CM

## 2021-08-24 DIAGNOSIS — R1011 Right upper quadrant pain: Secondary | ICD-10-CM | POA: Diagnosis not present

## 2021-08-29 ENCOUNTER — Other Ambulatory Visit (HOSPITAL_COMMUNITY): Payer: Self-pay | Admitting: *Deleted

## 2021-08-30 ENCOUNTER — Ambulatory Visit (HOSPITAL_COMMUNITY)
Admission: RE | Admit: 2021-08-30 | Discharge: 2021-08-30 | Disposition: A | Discharge status: Home / Self Care | Payer: Medicare HMO | Source: Ambulatory Visit | Attending: Internal Medicine | Admitting: Internal Medicine

## 2021-08-30 ENCOUNTER — Other Ambulatory Visit: Payer: Self-pay

## 2021-08-30 DIAGNOSIS — M810 Age-related osteoporosis without current pathological fracture: Secondary | ICD-10-CM | POA: Insufficient documentation

## 2021-08-30 MED ORDER — ZOLEDRONIC ACID 5 MG/100ML IV SOLN
INTRAVENOUS | Status: AC
  Filled 2021-08-30: qty 100

## 2021-08-30 MED ORDER — ZOLEDRONIC ACID 5 MG/100ML IV SOLN
5.0000 mg | Freq: Once | INTRAVENOUS | Status: AC
  Administered 2021-08-30: 5 mg via INTRAVENOUS

## 2021-11-14 DIAGNOSIS — M545 Low back pain, unspecified: Secondary | ICD-10-CM | POA: Diagnosis not present

## 2021-11-14 DIAGNOSIS — M5136 Other intervertebral disc degeneration, lumbar region: Secondary | ICD-10-CM | POA: Diagnosis not present

## 2021-12-19 DIAGNOSIS — Z961 Presence of intraocular lens: Secondary | ICD-10-CM | POA: Diagnosis not present

## 2021-12-19 DIAGNOSIS — H04123 Dry eye syndrome of bilateral lacrimal glands: Secondary | ICD-10-CM | POA: Diagnosis not present

## 2021-12-19 DIAGNOSIS — H43812 Vitreous degeneration, left eye: Secondary | ICD-10-CM | POA: Diagnosis not present

## 2021-12-19 DIAGNOSIS — H18513 Endothelial corneal dystrophy, bilateral: Secondary | ICD-10-CM | POA: Diagnosis not present

## 2022-01-16 DIAGNOSIS — M47896 Other spondylosis, lumbar region: Secondary | ICD-10-CM | POA: Diagnosis not present

## 2022-01-16 DIAGNOSIS — M47816 Spondylosis without myelopathy or radiculopathy, lumbar region: Secondary | ICD-10-CM | POA: Insufficient documentation

## 2022-01-24 DIAGNOSIS — M47816 Spondylosis without myelopathy or radiculopathy, lumbar region: Secondary | ICD-10-CM | POA: Diagnosis not present

## 2022-02-10 DIAGNOSIS — M48061 Spinal stenosis, lumbar region without neurogenic claudication: Secondary | ICD-10-CM | POA: Diagnosis not present

## 2022-02-10 DIAGNOSIS — M533 Sacrococcygeal disorders, not elsewhere classified: Secondary | ICD-10-CM | POA: Insufficient documentation

## 2022-02-26 DIAGNOSIS — L408 Other psoriasis: Secondary | ICD-10-CM | POA: Insufficient documentation

## 2022-02-27 ENCOUNTER — Encounter: Payer: Self-pay | Admitting: Family Medicine

## 2022-02-27 ENCOUNTER — Ambulatory Visit (INDEPENDENT_AMBULATORY_CARE_PROVIDER_SITE_OTHER): Payer: Medicare HMO | Admitting: Family Medicine

## 2022-02-27 VITALS — BP 116/68 | HR 61 | Temp 97.3°F | Ht 62.0 in | Wt 116.8 lb

## 2022-02-27 DIAGNOSIS — E785 Hyperlipidemia, unspecified: Secondary | ICD-10-CM | POA: Insufficient documentation

## 2022-02-27 DIAGNOSIS — F411 Generalized anxiety disorder: Secondary | ICD-10-CM | POA: Insufficient documentation

## 2022-02-27 DIAGNOSIS — F339 Major depressive disorder, recurrent, unspecified: Secondary | ICD-10-CM | POA: Insufficient documentation

## 2022-02-27 DIAGNOSIS — Z13820 Encounter for screening for osteoporosis: Secondary | ICD-10-CM | POA: Diagnosis not present

## 2022-02-27 DIAGNOSIS — N951 Menopausal and female climacteric states: Secondary | ICD-10-CM

## 2022-02-27 DIAGNOSIS — L280 Lichen simplex chronicus: Secondary | ICD-10-CM | POA: Insufficient documentation

## 2022-02-27 DIAGNOSIS — G4700 Insomnia, unspecified: Secondary | ICD-10-CM | POA: Insufficient documentation

## 2022-02-27 DIAGNOSIS — M8580 Other specified disorders of bone density and structure, unspecified site: Secondary | ICD-10-CM | POA: Insufficient documentation

## 2022-02-27 DIAGNOSIS — R69 Illness, unspecified: Secondary | ICD-10-CM | POA: Diagnosis not present

## 2022-02-27 DIAGNOSIS — Z8611 Personal history of tuberculosis: Secondary | ICD-10-CM | POA: Insufficient documentation

## 2022-02-27 DIAGNOSIS — Z86010 Personal history of colonic polyps: Secondary | ICD-10-CM | POA: Insufficient documentation

## 2022-02-27 MED ORDER — FLUOXETINE HCL 10 MG PO CAPS: 10.0000 mg | ORAL_CAPSULE | Freq: Every day | ORAL | 3 refills | Status: DC

## 2022-02-27 NOTE — Progress Notes (Signed)
?[facility] ?[facility] ?[place] ?[place] [address] ?Dept: [phone] ?Dept Fax: [phone] ? ?New Patient Office Visit ? ?Subjective:  ? ? Patient ID: Lisa Oconnell, female    DOB: [birth year], 81 y.o..   MRN: [id number] ? ?Chief Complaint  ?Patient presents with  ? Establish Care  ?  NP- establish care.  No concerns. Not fasting today.   ? ? ?History of Present Illness: ? ?Patient is in today to establish care. Ms. Olveda was born in [place]. She worked as a Theme park manager for many years. She became a widow in 1977, losing her first husband to a brain tumor. She was single for 15 years and then remarried. She has been married to her current husband for 30 years. She has 2 children daughters (57, 73) and 4 grandchildren. She denies tobacco use. She admits to drinking a cocktail most evenings. ? ?Ms. Wahler notes she has a history of anxiety/depression issues. She states she has had a prior hospitalization related to this. She is currently managed on Prozac 20 mg daily and alprazolam 0.5 mg 1 1/2 tabs nightly. She has been on this regimen for quite some time. She admits that she is still struggling with her mood symptoms. She has been having some marital discord, primarily centered around money issues. She notes her husband has been on a trip to visit his daughter in [place]. [place]. [place] is concerned that on his return he will ask for a divorce. She is concerned that she will be forced to sell her current home to divide the money with her husband. This has been fueling her concerns. They did try marital counseling previously, but she feels her husband put all of the issues off on Ms. Meacham's own depressive issues. ? ?Ms. Das had pulmonary tuberculosis in 1967. She spent 6 months in a TB sanitarium at [facility] near [place]. She was treated with medications for several years and apparently was cured. ? ?Ms. Giuliano has a history of a prior laminectomy of her  back due to a herniated disc. ? ?Past Medical History: ?Patient Active Problem List  ? Diagnosis Date Noted  ? Depression, recurrent ([place]) 02/27/2022  ? Generalized anxiety disorder 02/27/2022  ? Insomnia 02/27/2022  ? History of tuberculosis 02/27/2022  ? Sebopsoriasis 02/26/2022  ? Sacroiliac joint pain 02/10/2022  ? Lumbar spondylosis 01/16/2022  ? HNP (herniated nucleus pulposus), lumbar 02/17/2021  ? Neurogenic claudication 01/25/2021  ? Degenerative lumbar spinal stenosis 01/25/2021  ? History of prosthetic unicompartmental arthroplasty of right knee 04/01/2020  ? OA (osteoarthritis) of knee 03/28/2017  ? Acute medial meniscal tear 04/11/2016  ? ? ?Past Surgical History:  ?Procedure Laterality Date  ? BREAST EXCISIONAL BIOPSY Right   ? benign/ 81 years old  ? BUNIONECTOMY    ? left  ? CATARACT EXTRACTION W/ INTRAOCULAR LENS  IMPLANT, BILATERAL  2013  ? cool sculpting    ? both arms and back  ? DILATION AND CURETTAGE OF UTERUS    ? EYE SURGERY    ? HAND CONTRACTURE RELEASE    ? trigger finger  ? KNEE ARTHROSCOPY WITH MEDIAL MENISECTOMY Right 04/11/2016  ? Procedure: KNEE ARTHROSCOPY WITH MEDIAL MENISECTOMY AND CONDROPLASTY;  Surgeon: Gaynelle Arabian, MD;  Location: [facility];  Service: Orthopedics;  Laterality: Right;  ? liposuction (15 yrs ago    ? stomach  ? LUMBAR LAMINECTOMY/DECOMPRESSION MICRODISCECTOMY N/A 02/17/2021  ? Procedure: Microlumbar Decompression Lumbar one-two, Lumbar two-three, Lumbar three-four;  Surgeon: Susa Day,  MD;  Location: [facility];  Service: Orthopedics;  Laterality: N/A;  ? PARTIAL KNEE ARTHROPLASTY Right 03/28/2017  ? Procedure: UNICOMPARTMENTAL RIGHT  KNEE;  Surgeon: Gaynelle Arabian, MD;  Location: [facility];  Service: Orthopedics;  Laterality: Right;  Adductor Block  ? TUBAL LIGATION  1978  ? ? ?Family History  ?Problem Relation Age of Onset  ? Alzheimer's disease Mother   ? Heart disease Father   ? Heart attack Father   ? Diabetes Sister   ? Diabetes Brother   ? Cancer Daughter   ?      Non-Hodgkins Lymphoma  ? Diabetes Paternal Aunt   ? Cancer Grandson   ?     Leukemia  ? Diabetes Grandson   ?     Type 1  ? Epilepsy Grandson   ? Breast cancer Neg Hx   ? ? ?Outpatient Medications Prior to Visit  ?Medication Sig Dispense Refill  ? acetaminophen (TYLENOL) 500 MG tablet Take 1,000 mg by mouth at bedtime.    ? ALPRAZolam (XANAX) 0.5 MG tablet Take 0.5 mg by mouth at bedtime as needed for anxiety or sleep.    ? FLUoxetine (PROZAC) 20 MG capsule Take 20 mg by mouth daily.  5  ? docusate sodium (COLACE) 100 MG capsule Take 1 capsule (100 mg total) by mouth 2 (two) times daily as needed for mild constipation. 30 capsule 0  ? HYDROcodone-acetaminophen (NORCO/VICODIN) 5-325 MG tablet Take 1 tablet by mouth every 4 (four) hours as needed for moderate pain ((score 4 to 6)). 30 tablet 0  ? polyethylene glycol (MIRALAX / GLYCOLAX) 17 g packet Take 17 g by mouth daily as needed for mild constipation. 14 each 0  ? ?No facility-administered medications prior to visit.  ? ? ?Allergies  ?Allergen Reactions  ? Sulfa Antibiotics Swelling  ?  Mouth blistered,   ? ?   ?Objective:  ? ?Today's Vitals  ? 02/27/22 1004  ?BP: 116/68  ?Pulse: 61  ?Temp: (!) 97.3 ?F (36.3 ?C)  ?TempSrc: Temporal  ?SpO2: 98%  ?Weight: 116 lb 12.8 oz (53 kg)  ?Height: '5\' 2"'$  (1.575 m)  ? ?Body mass index is 21.36 kg/m?.  ? ?General: Well developed, well nourished. No acute distress. ?Psych: Alert and oriented. Depressed mood and sad affect with tearfulness. ? ?Health Maintenance Due  ?Topic Date Due  ? Hepatitis C Screening  Never done  ? TETANUS/TDAP  Never done  ? Zoster Vaccines- Shingrix (1 of 2) Never done  ? Pneumonia Vaccine 53+ Years old (1 - PCV) Never done  ? DEXA SCAN  Never done  ?   ? ?  02/27/2022  ? 10:50 AM  ?Depression screen PHQ 2/9  ?Decreased Interest 0  ?Down, Depressed, Hopeless 3  ?PHQ - 2 Score 3  ?Altered sleeping 0  ?Tired, decreased energy 0  ?Change in appetite 3  ?Feeling bad or failure about yourself  3  ?Trouble  concentrating 0  ?Moving slowly or fidgety/restless 1  ?Suicidal thoughts 2  ?PHQ-9 Score 12  ?Difficult doing work/chores Somewhat difficult  ? ? ?  02/27/2022  ? 10:50 AM  ?GAD 7 : Generalized Anxiety Score  ?Nervous, Anxious, on Edge 3  ?Control/stop worrying 3  ?Worry too much - different things 3  ?Trouble relaxing 3  ?Restless 1  ?Easily annoyed or irritable 2  ?Afraid - awful might happen 3  ?Total GAD 7 Score 18  ?Anxiety Difficulty Somewhat difficult  ? ?Assessment & Plan:  ? ?1. Depression, recurrent (  [place]) ?Ms. Ola has long-standing depression, which  is currently being triggered by marital issues. I will increase her Prozac dose. I will refer her for counseling. I plan to see her back in 6 weeks to assess efficacy. ? ?- FLUoxetine (PROZAC) 10 MG capsule; Take 1 capsule (10 mg total) by mouth daily. Take with fluoxetine 20 mg capsules daily (total daily dose 30 mg daily)  Dispense: 90 capsule; Refill: 3 ?- Ambulatory referral to Psychology ? ?2. Generalized anxiety disorder ?As above. ? ?- Ambulatory referral to Psychology ? ?3. Insomnia, unspecified type ?Continue alprazolam. ? ?4. Screening for osteoporosis ?5. Menopausal state ?Review of old records indicates prior osteopenia. I will reassess as it has ben more than 2 years since her last scan. ? ?- DG Bone Density; Future ? ? ?Return in about 6 weeks (around 04/10/2022) for Reassessment.  ? ?Haydee Salter, MD ?

## 2022-03-08 DIAGNOSIS — M533 Sacrococcygeal disorders, not elsewhere classified: Secondary | ICD-10-CM | POA: Diagnosis not present

## 2022-04-10 ENCOUNTER — Ambulatory Visit (INDEPENDENT_AMBULATORY_CARE_PROVIDER_SITE_OTHER): Payer: Medicare HMO | Admitting: Family Medicine

## 2022-04-10 ENCOUNTER — Encounter: Payer: Self-pay | Admitting: Family Medicine

## 2022-04-10 ENCOUNTER — Telehealth: Payer: Self-pay | Admitting: Family Medicine

## 2022-04-10 VITALS — BP 118/72 | HR 55 | Temp 97.2°F | Wt 115.4 lb

## 2022-04-10 DIAGNOSIS — R69 Illness, unspecified: Secondary | ICD-10-CM | POA: Diagnosis not present

## 2022-04-10 DIAGNOSIS — F411 Generalized anxiety disorder: Secondary | ICD-10-CM | POA: Diagnosis not present

## 2022-04-10 DIAGNOSIS — F339 Major depressive disorder, recurrent, unspecified: Secondary | ICD-10-CM

## 2022-04-10 MED ORDER — FLUOXETINE HCL 40 MG PO CAPS: 40.0000 mg | ORAL_CAPSULE | Freq: Every day | ORAL | 3 refills | Status: DC

## 2022-04-10 NOTE — Progress Notes (Signed)
[facility] [address] [place] [address] Dept: [phone] Dept Fax: [phone]  Chronic Care Office Visit  Subjective:    Patient ID: Lisa Oconnell, female    DOB: 1941/01/17, 81 y.o..   MRN: [id number]  Chief Complaint  Patient presents with   Follow-up    6 week follow up on PHQ/GAD    History of Present Illness:  Patient is in today for reassessment of chronic medical issues.  Ms. Helinski has a history of anxiety/depression issues. She is currently managed on Prozac 30 mg daily and alprazolam 0.5 mg 1 1/2 tabs nightly. We increased her Prozac dose at her last visit 6 weeks ago. She feels her mood issues are mildly improved. She continues to have issues with marital discord, family issues with her step children, and money issues. She has been taking steps to help protect her own financial interests, should he husband decide to divorce her.  Past Medical History: Patient Active Problem List   Diagnosis Date Noted   Depression, recurrent ([place]) 02/27/2022   Generalized anxiety disorder 02/27/2022   Insomnia 02/27/2022   History of tuberculosis 02/27/2022   History of colon polyps 02/27/2022   Hyperlipidemia 02/27/2022   Osteopenia 47/42/5956   Lichen simplex chronicus 02/27/2022   Sebopsoriasis 02/26/2022   Sacroiliac joint pain 02/10/2022   Lumbar spondylosis 01/16/2022   HNP (herniated nucleus pulposus), lumbar 02/17/2021   Neurogenic claudication 01/25/2021   Degenerative lumbar spinal stenosis 01/25/2021   History of prosthetic unicompartmental arthroplasty of right knee 04/01/2020   OA (osteoarthritis) of knee 03/28/2017   Acute medial meniscal tear 04/11/2016   Past Surgical History:  Procedure Laterality Date   BREAST EXCISIONAL BIOPSY Right    benign/ 81 years old   BUNIONECTOMY     left   CATARACT EXTRACTION W/ INTRAOCULAR LENS  IMPLANT, BILATERAL  2013   cool sculpting     both arms and back    DILATION AND CURETTAGE OF UTERUS     EYE SURGERY     HAND CONTRACTURE RELEASE     trigger finger   KNEE ARTHROSCOPY WITH MEDIAL MENISECTOMY Right 04/11/2016   Procedure: KNEE ARTHROSCOPY WITH MEDIAL MENISECTOMY AND CONDROPLASTY;  Surgeon: Gaynelle Arabian, MD;  Location: [facility];  Service: Orthopedics;  Laterality: Right;   liposuction (15 yrs ago     stomach   LUMBAR LAMINECTOMY/DECOMPRESSION MICRODISCECTOMY N/A 02/17/2021   Procedure: Microlumbar Decompression Lumbar one-two, Lumbar two-three, Lumbar three-four;  Surgeon: Susa Day, MD;  Location: [facility];  Service: Orthopedics;  Laterality: N/A;   PARTIAL KNEE ARTHROPLASTY Right 03/28/2017   Procedure: UNICOMPARTMENTAL RIGHT  KNEE;  Surgeon: Gaynelle Arabian, MD;  Location: [facility];  Service: Orthopedics;  Laterality: Right;  Adductor Block   TUBAL LIGATION  1978   Family History  Problem Relation Age of Onset   Alzheimer's disease Mother    Heart disease Father    Heart attack Father    Diabetes Sister    Diabetes Brother    Cancer Daughter        Non-Hodgkins Lymphoma   Diabetes Paternal Aunt    Cancer Grandson        Leukemia   Diabetes Grandson        Type 1   Epilepsy Grandson    Breast cancer Neg Hx    Outpatient Medications Prior to Visit  Medication Sig Dispense Refill   acetaminophen (TYLENOL) 500 MG tablet Take 1,000 mg by mouth at bedtime.  ALPRAZolam (XANAX) 0.5 MG tablet Take 0.5 mg by mouth at bedtime as needed for anxiety or sleep.     FLUoxetine (PROZAC) 10 MG capsule Take 1 capsule (10 mg total) by mouth daily. Take with fluoxetine 20 mg capsules daily (total daily dose 30 mg daily) 90 capsule 3   FLUoxetine (PROZAC) 20 MG capsule Take 20 mg by mouth daily.  5   No facility-administered medications prior to visit.   Allergies  Allergen Reactions   Sulfa Antibiotics Swelling    Mouth blistered,     Objective:   Today's Vitals   04/10/22 0920  BP: 118/72  Pulse: (!) 55  Temp: (!) 97.2 F (36.2 C)   TempSrc: Temporal  SpO2: 97%  Weight: 115 lb 6.4 oz (52.3 kg)   Body mass index is 21.11 kg/m.   General: Well developed, well nourished. No acute distress. Psych: Alert and oriented. Normal mood and affect.  Health Maintenance Due  Topic Date Due   Hepatitis C Screening  Never done      04/10/2022    9:50 AM 02/27/2022   10:50 AM  Depression screen PHQ 2/9  Decreased Interest 0 0  Down, Depressed, Hopeless 1 3  PHQ - 2 Score 1 3  Altered sleeping 0 0  Tired, decreased energy 0 0  Change in appetite 2 3  Feeling bad or failure about yourself  3 3  Trouble concentrating 0 0  Moving slowly or fidgety/restless 0 1  Suicidal thoughts 2 2  PHQ-9 Score 8 12  Difficult doing work/chores Not difficult at all Somewhat difficult      04/10/2022    9:50 AM 02/27/2022   10:50 AM  GAD 7 : Generalized Anxiety Score  Nervous, Anxious, on Edge 2 3  Control/stop worrying 3 3  Worry too much - different things 3 3  Trouble relaxing 2 3  Restless 2 1  Easily annoyed or irritable 3 2  Afraid - awful might happen 3 3  Total GAD 7 Score 18 18  Anxiety Difficulty Very difficult Somewhat difficult     Assessment & Plan:   1. Depression, recurrent ([place]) 2. Generalized anxiety disorder Mild improvements in PHQ score. I recommend we increase her Prozac dose to 40 mg daily. I spent time allowing her to discuss the current marital/family issues. I support her decision to position herself to protect her finances. I will see her back ion 6 weeks to reassess.  - FLUoxetine (PROZAC) 40 MG capsule; Take 1 capsule (40 mg total) by mouth daily. Take with fluoxetine 20 mg capsules daily (total daily dose 30 mg daily)  Dispense: 30 capsule; Refill: 3   Return in about 6 weeks (around 05/22/2022) for Reassessment.   Haydee Salter, MD

## 2022-04-10 NOTE — Telephone Encounter (Signed)
Please advise, see below. Current signature for Fluoxetine states "Sig: Take 1 capsule (40 mg total) by mouth daily. Take with fluoxetine 20 mg capsules daily (total daily dose 30 mg daily)"

## 2022-04-10 NOTE — Telephone Encounter (Signed)
Merrilee Seashore from pharmacy [phone] called for clarification on fuloretine, He said he was unclear with the dosage.

## 2022-04-10 NOTE — Telephone Encounter (Signed)
Spoke with Merrilee Seashore and advised him of annotation below.He verbalized understanding.

## 2022-05-22 DIAGNOSIS — M545 Low back pain, unspecified: Secondary | ICD-10-CM | POA: Diagnosis not present

## 2022-05-25 NOTE — Progress Notes (Signed)
Subjective:   Lisa Oconnell is a 81 y.o. female who presents for Medicare Annual (Subsequent) preventive examination.  I connected with Lisa Oconnell today by telephone and verified that I am speaking with the correct person using two identifiers. DOB and name Location patient: home Location provider: work Persons participating in visit: patient, Armandina Gemma, CMA   Review of Systems    N/A - not in person visit       Objective:    Today's Vitals   05/26/22 0944  PainSc: 2    There is no height or weight on file to calculate BMI.     02/17/2021    5:46 AM 02/16/2021   10:16 AM 04/13/2017   10:56 AM 03/28/2017    6:46 PM 03/23/2017    8:43 AM 04/10/2016   11:29 AM  Advanced Directives  Does Patient Have a Medical Advance Directive? No No No Yes Yes Yes  Type of Scientist, research (medical);Living will [place];Living will [place];Living will  Does patient want to make changes to medical advance directive?    No - Patient declined  No - Patient declined  Copy of [place] in Chart?    No - copy requested No - copy requested No - copy requested  Would patient like information on creating a medical advance directive?  No - Patient declined        Current Medications (verified) Outpatient Encounter Medications as of 05/26/2022  Medication Sig   ALPRAZolam (XANAX) 0.5 MG tablet Take 0.5 mg by mouth at bedtime as needed for anxiety or sleep.   FLUoxetine (PROZAC) 40 MG capsule Take 1 capsule (40 mg total) by mouth daily.   traMADol (ULTRAM) 50 MG tablet Take 1 tablet 3 times a day by oral route as needed for pain for 5 days.   acetaminophen (TYLENOL) 500 MG tablet Take 1,000 mg by mouth at bedtime. (Patient not taking: Reported on 05/26/2022)   No facility-administered encounter medications on file as of 05/26/2022.    Allergies (verified) Sulfa antibiotics   History: Past Medical History:   Diagnosis Date   Allergy    year round   Anxiety    Arthritis    per pt/ not aware of it.   Cataract    bilateral cataract extraction. not sure if lens were implanted.   Colon polyp 01/2006   adenomatous   Depression    Hyperlipidemia    Tuberculosis 1966   treated   Past Surgical History:  Procedure Laterality Date   BREAST EXCISIONAL BIOPSY Right    benign/ 81 years old   BUNIONECTOMY     left   CATARACT EXTRACTION W/ INTRAOCULAR LENS  IMPLANT, BILATERAL  2013   cool sculpting     both arms and back   DILATION AND CURETTAGE OF UTERUS     EYE SURGERY     HAND CONTRACTURE RELEASE     trigger finger   KNEE ARTHROSCOPY WITH MEDIAL MENISECTOMY Right 04/11/2016   Procedure: KNEE ARTHROSCOPY WITH MEDIAL MENISECTOMY AND CONDROPLASTY;  Surgeon: Gaynelle Arabian, MD;  Location: [facility];  Service: Orthopedics;  Laterality: Right;   liposuction (15 yrs ago     stomach   LUMBAR LAMINECTOMY/DECOMPRESSION MICRODISCECTOMY N/A 02/17/2021   Procedure: Microlumbar Decompression Lumbar one-two, Lumbar two-three, Lumbar three-four;  Surgeon: Susa Day, MD;  Location: [facility];  Service: Orthopedics;  Laterality: N/A;   PARTIAL KNEE ARTHROPLASTY Right 03/28/2017  Procedure: UNICOMPARTMENTAL RIGHT  KNEE;  Surgeon: Gaynelle Arabian, MD;  [facility];  Service: Orthopedics;  Laterality: Right;  Adductor Block   TUBAL LIGATION  1978   Family History  Problem Relation Age of Onset   Alzheimer's disease Mother    Heart disease Father    Heart attack Father    Diabetes Sister    Diabetes Brother    Cancer Daughter        Non-Hodgkins Lymphoma   Diabetes Paternal Aunt    Cancer Grandson        Leukemia   Diabetes Grandson        Type 1   Epilepsy Grandson    Breast cancer Neg Hx    Social History   Socioeconomic History   Marital status: Married    Spouse name: Not on file   Number of children: 2   Years of education: Not on file   Highest education level: Not on file  Occupational  History   Occupation: Retired  Tobacco Use   Smoking status: Never   Smokeless tobacco: Never  Vaping Use   Vaping Use: Never used  Substance and Sexual Activity   Alcohol use: Yes    Alcohol/week: 10.0 standard drinks of alcohol    Types: 10 Glasses of wine per week    Comment:  glasses of wine weekly    Drug use: No   Sexual activity: Yes  Other Topics Concern   Not on file  Social History Narrative   Not on file   Social Determinants of Health   Financial Resource Strain: Not on file  Food Insecurity: Not on file  Transportation Needs: Not on file  Physical Activity: Not on file  Stress: Not on file  Social Connections: Not on file    Tobacco Counseling Counseling given: Not Answered   Clinical Intake:  Pre-visit preparation completed: Yes  Pain : 0-10 (back pain) Pain Score: 2  Pain Type: Chronic pain Pain Location: Back Pain Orientation: Lower Pain Descriptors / Indicators: Burning, Dull, Aching Pain Onset: More than a month ago Pain Frequency: Intermittent     Nutritional Status: BMI of 19-24  Normal Nutritional Risks: None Diabetes: No  How often do you need to have someone help you when you read instructions, pamphlets, or other written materials from your doctor or pharmacy?: 1 - Never What is the last grade level you completed in school?: 12 grade  Diabetic?No  Interpreter Needed?: No  Information entered by :: Ilda Laskin, CMA   Activities of Daily Living    05/26/2022    9:55 AM  In your present state of health, do you have any difficulty performing the following activities:  Hearing? 0  Vision? 0  Difficulty concentrating or making decisions? 1  Walking or climbing stairs? 1  Dressing or bathing? 0  Doing errands, shopping? 0    Patient Care Team: Haydee Salter, MD as PCP - General (Family Medicine)  Indicate any recent Medical Services you may have received from other than [facility] providers in the past year (date may be  approximate).     Assessment:   This is a routine wellness examination for Lisa Oconnell.  Hearing/Vision screen No results found.  Dietary issues and exercise activities discussed:     Goals Addressed   None    Depression Screen    05/26/2022    9:54 AM 04/10/2022    9:50 AM 02/27/2022   10:50 AM  PHQ 2/9 Scores  PHQ -  2 Score '5 1 3  '$ PHQ- 9 Score '9 8 12    '$ Fall Risk    05/26/2022   10:04 AM 04/10/2022    9:23 AM 02/27/2022   10:04 AM  Fall Risk   Falls in the past year? 0 0 0  Number falls in past yr: 0 0 0  Injury with Fall? 0 0 0  Risk for fall due to : No Fall Risks  No Fall Risks  Follow up Falls evaluation completed  Falls evaluation completed    [place]:  Any stairs in or around the home? Yes  If so, are there any without handrails? Yes  Home free of loose throw rugs in walkways, pet beds, electrical cords, etc? Yes  Adequate lighting in your home to reduce risk of falls? Yes   ASSISTIVE DEVICES UTILIZED TO PREVENT FALLS:  Life alert? No  Use of a cane, walker or w/c? No  Grab bars in the bathroom? No  Shower chair or bench in shower? Yes  Elevated toilet seat or a handicapped toilet? Yes   TIMED UP AND GO:  Was the test performed? No .  Length of time to ambulate 10 feet: N/A sec.    Cognitive Function:        05/26/2022   10:00 AM  6CIT Screen  What Year? 0 points  What month? 0 points  What time? 0 points  Count back from 20 0 points  Months in reverse 0 points  Repeat phrase 0 points  Total Score 0 points    Immunizations Immunization History  Administered Date(s) Administered   PFIZER(Purple Top)SARS-COV-2 Vaccination 12/13/2019, 01/07/2020, 08/17/2020   Pfizer Covid-19 Vaccine Bivalent Booster 39yr & up 08/24/2021   Pneumococcal Conjugate-13 04/27/2014   Pneumococcal Polysaccharide-23 12/02/2007, 03/29/2010   Td 03/02/2009, 03/29/2010   Tdap 05/04/2016   Zoster Recombinat (Shingrix) 03/02/2009,  03/29/2010   Zoster, Live 11/06/2005    TDAP status: Up to date  Flu Vaccine status: Up to date  Pneumococcal vaccine status: Up to date  Covid-19 vaccine status: Completed vaccines  Qualifies for Shingles Vaccine? Yes   Zostavax completed Yes   Shingrix Completed?: Yes  Screening Tests Health Maintenance  Topic Date Due   Hepatitis C Screening  Never done   COVID-19 Vaccine (5 - Pfizer series) 12/25/2021   INFLUENZA VACCINE  06/06/2022   TETANUS/TDAP  05/04/2026   Pneumonia Vaccine 80 Years old  Completed   DEXA SCAN  Completed   Zoster Vaccines- Shingrix  Completed   HPV VACCINES  Aged Out    Health Maintenance  Health Maintenance Due  Topic Date Due   Hepatitis C Screening  Never done   COVID-19 Vaccine (5 - POmakseries) 12/25/2021    Colorectal cancer screening: Type of screening: Colonoscopy. Completed 06/04/2018. Repeat every 3 years  Mammogram  needs ordered  Bone Density status: Completed 07/03/2019. Results reflect: Bone density results: OSTEOPENIA. Repeat every 2 years.  Lung Cancer Screening: (Low Dose CT Chest recommended if Age 80-80years, 30 pack-year currently smoking OR have quit w/in 15years.) does not qualify.   Lung Cancer Screening Referral: N/A  Additional Screening:  Hepatitis C Screening: does not qualify;  Vision Screening: Recommended annual ophthalmology exams for early detection of glaucoma and other disorders of the eye. Is the patient up to date with their annual eye exam?  Yes  Who is the provider or what is the name of the office in which the patient attends  annual eye exams? Dr Alphonzo Lemmings If pt is not established with a provider, would they like to be referred to a provider to establish care? No .   Dental Screening: Recommended annual dental exams for proper oral hygiene  Community Resource Referral / Chronic Care Management: CRR required this visit?  No   CCM required this visit?  No      Plan:     I have  personally reviewed and noted the following in the patient's chart:   Medical and social history Use of alcohol, tobacco or illicit drugs  Current medications and supplements including opioid prescriptions.  Functional ability and status Nutritional status Physical activity Advanced directives List of other physicians Hospitalizations, surgeries, and ER visits in previous 12 months Vitals Screenings to include cognitive, depression, and falls Referrals and appointments  In addition, I have reviewed and discussed with patient certain preventive protocols, quality metrics, and best practice recommendations. A written personalized care plan for preventive services as well as general preventive health recommendations were provided to patient.     Konrad Saha, [place]   05/26/2022   Nurse Notes: none face to face 40 min encounter time   Ms. Lisa Oconnell , Thank you for taking time to come for your Medicare Wellness Visit. I appreciate your ongoing commitment to your health goals. Please review the following plan we discussed and let me know if I can assist you in the future.   These are the goals we discussed:  Goals   None     This is a list of the screening recommended for you and due dates:  Health Maintenance  Topic Date Due   Hepatitis C Screening: USPSTF Recommendation to screen - Ages 75-79 yo.  Never done   COVID-19 Vaccine (5 - Pfizer series) 12/25/2021   Flu Shot  06/06/2022   Tetanus Vaccine  05/04/2026   Pneumonia Vaccine  Completed   DEXA scan (bone density measurement)  Completed   Zoster (Shingles) Vaccine  Completed   HPV Vaccine  Aged Out

## 2022-05-26 ENCOUNTER — Ambulatory Visit (INDEPENDENT_AMBULATORY_CARE_PROVIDER_SITE_OTHER): Payer: Medicare HMO

## 2022-05-26 DIAGNOSIS — Z0000 Encounter for general adult medical examination without abnormal findings: Secondary | ICD-10-CM

## 2022-05-29 ENCOUNTER — Ambulatory Visit (INDEPENDENT_AMBULATORY_CARE_PROVIDER_SITE_OTHER): Payer: Medicare HMO | Admitting: Family Medicine

## 2022-05-29 VITALS — BP 130/72 | HR 60 | Temp 97.6°F | Ht 62.0 in | Wt 116.4 lb

## 2022-05-29 DIAGNOSIS — R69 Illness, unspecified: Secondary | ICD-10-CM | POA: Diagnosis not present

## 2022-05-29 DIAGNOSIS — F339 Major depressive disorder, recurrent, unspecified: Secondary | ICD-10-CM

## 2022-05-29 DIAGNOSIS — M5126 Other intervertebral disc displacement, lumbar region: Secondary | ICD-10-CM | POA: Diagnosis not present
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
## 2022-06-15 ENCOUNTER — Telehealth: Payer: Self-pay

## 2022-06-15 NOTE — Telephone Encounter (Signed)
Spoke to patient, she is wanting to know if you can give her a letter or something stating that her dog is a support animal? She thinks that her and her husband is going to be getting divorced and is afraid he will try to take her dog from her.   Please review and advise.  Thanks. Dm/cma

## 2022-06-16 NOTE — Telephone Encounter (Signed)
Spoke to patient and scheduled an appointment for 06/22/22.  Dm/cma

## 2022-06-16 NOTE — Telephone Encounter (Signed)
Tried to call, she answered then hung up.  She will call back when safe to do so. Dm/cma

## 2022-06-22 ENCOUNTER — Ambulatory Visit (INDEPENDENT_AMBULATORY_CARE_PROVIDER_SITE_OTHER): Payer: Medicare HMO | Admitting: Family Medicine

## 2022-06-22 ENCOUNTER — Encounter: Payer: Self-pay | Admitting: Family Medicine

## 2022-06-22 VITALS — BP 120/70 | HR 63 | Temp 97.9°F | Ht 62.0 in | Wt 113.0 lb

## 2022-06-22 DIAGNOSIS — R69 Illness, unspecified: Secondary | ICD-10-CM | POA: Diagnosis not present

## 2022-06-22 DIAGNOSIS — F339 Major depressive disorder, recurrent, unspecified: Secondary | ICD-10-CM

## 2022-06-22 DIAGNOSIS — F411 Generalized anxiety disorder: Secondary | ICD-10-CM | POA: Diagnosis not present

## 2022-06-22 DIAGNOSIS — Z0000 Encounter for general adult medical examination without abnormal findings: Secondary | ICD-10-CM
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
## 2022-06-27 DIAGNOSIS — M5136 Other intervertebral disc degeneration, lumbar region: Secondary | ICD-10-CM | POA: Diagnosis not present

## 2022-06-27 DIAGNOSIS — M961 Postlaminectomy syndrome, not elsewhere classified: Secondary | ICD-10-CM | POA: Diagnosis not present

## 2022-06-28 DIAGNOSIS — M5126 Other intervertebral disc displacement, lumbar region: Secondary | ICD-10-CM | POA: Diagnosis not present

## 2022-06-28 DIAGNOSIS — M48061 Spinal stenosis, lumbar region without neurogenic claudication: Secondary | ICD-10-CM | POA: Diagnosis not present

## 2022-06-28 DIAGNOSIS — M961 Postlaminectomy syndrome, not elsewhere classified: Secondary | ICD-10-CM | POA: Diagnosis not present

## 2022-06-30 DIAGNOSIS — M961 Postlaminectomy syndrome, not elsewhere classified: Secondary | ICD-10-CM | POA: Diagnosis not present

## 2022-07-26 DIAGNOSIS — M5451 Vertebrogenic low back pain: Secondary | ICD-10-CM | POA: Diagnosis not present

## 2022-07-26 DIAGNOSIS — M25512 Pain in left shoulder: Secondary | ICD-10-CM | POA: Diagnosis not present

## 2022-08-02 DIAGNOSIS — M5451 Vertebrogenic low back pain: Secondary | ICD-10-CM | POA: Diagnosis not present

## 2022-08-02 DIAGNOSIS — M25512 Pain in left shoulder: Secondary | ICD-10-CM | POA: Diagnosis not present

## 2022-08-15 ENCOUNTER — Ambulatory Visit
Admission: RE | Admit: 2022-08-15 | Discharge: 2022-08-15 | Disposition: A | Discharge status: Home / Self Care | Payer: Medicare HMO | Source: Ambulatory Visit | Attending: Family Medicine | Admitting: Family Medicine

## 2022-08-15 DIAGNOSIS — Z13820 Encounter for screening for osteoporosis: Secondary | ICD-10-CM

## 2022-08-15 DIAGNOSIS — M85851 Other specified disorders of bone density and structure, right thigh: Secondary | ICD-10-CM | POA: Diagnosis not present

## 2022-08-15 DIAGNOSIS — Z780 Asymptomatic menopausal state: Secondary | ICD-10-CM | POA: Diagnosis not present

## 2022-08-15 DIAGNOSIS — N951 Menopausal and female climacteric states: Secondary | ICD-10-CM

## 2022-08-21 DIAGNOSIS — M25512 Pain in left shoulder: Secondary | ICD-10-CM | POA: Diagnosis not present

## 2022-08-21 DIAGNOSIS — M5451 Vertebrogenic low back pain: Secondary | ICD-10-CM | POA: Diagnosis not present

## 2022-08-30 ENCOUNTER — Ambulatory Visit: Payer: Medicare HMO

## 2022-09-04 ENCOUNTER — Ambulatory Visit (INDEPENDENT_AMBULATORY_CARE_PROVIDER_SITE_OTHER): Payer: Medicare HMO | Admitting: Family Medicine

## 2022-09-04 VITALS — BP 124/78 | HR 76 | Temp 97.6°F | Ht 62.0 in | Wt 111.6 lb

## 2022-09-04 DIAGNOSIS — F339 Major depressive disorder, recurrent, unspecified: Secondary | ICD-10-CM

## 2022-09-04 DIAGNOSIS — R69 Illness, unspecified: Secondary | ICD-10-CM | POA: Diagnosis not present

## 2022-09-04 MED ORDER — FLUOXETINE HCL 20 MG PO CAPS: 20.0000 mg | ORAL_CAPSULE | Freq: Every day | ORAL | 3 refills | Status: DC

## 2022-09-04 MED ORDER — FLUOXETINE HCL 40 MG PO CAPS: 40.0000 mg | ORAL_CAPSULE | Freq: Every day | ORAL | 3 refills | Status: DC

## 2022-09-04 NOTE — Progress Notes (Signed)
[facility] [address] [place] [address] Dept: [phone] Dept Fax: [phone]  Chronic Care Office Visit  Subjective:    Patient ID: Lisa Oconnell, female    DOB: Feb 15, 1941, 81 y.o..   MRN: [id number]  Chief Complaint  Patient presents with   Follow-up    3 month f/u.      History of Present Illness:  Patient is in today for reassessment of chronic medical issues.  Lisa Oconnell has a history of depression and anxiety. Since her last visit, her husband has asked her for a divorce. She describes ongoing marital discord. She cotninues to have significant worries regardign her financial situation, esp. related to debts that he has accrued. She describes his behaviors as being impulsive and erratic. She has found all of this very difficult to live with. She wishes he would just leave, but he continues to stay int he home (which she owned prior to their marriage). She does have good support form her daughters, but notes they do not come ot her home as to avoid being around her husband. Lisa Oconnell has obtained a lawyer and is just starting through legal maneuvers to accomplish the divorce.  Past Medical History: Patient Active Problem List   Diagnosis Date Noted   Depression, recurrent ([place]) 02/27/2022   Generalized anxiety disorder 02/27/2022   Insomnia 02/27/2022   History of tuberculosis 02/27/2022   History of colon polyps 02/27/2022   Hyperlipidemia 02/27/2022   Osteopenia 66/04/3015   Lichen simplex chronicus 02/27/2022   Sebopsoriasis 02/26/2022   Sacroiliac joint pain 02/10/2022   Lumbar spondylosis 01/16/2022   HNP (herniated nucleus pulposus), lumbar 02/17/2021   Neurogenic claudication 01/25/2021   Degenerative lumbar spinal stenosis 01/25/2021   History of prosthetic unicompartmental arthroplasty of right knee 04/01/2020   OA (osteoarthritis) of knee 03/28/2017   Acute medial meniscal tear 04/11/2016    Past Surgical History:  Procedure Laterality Date   BREAST EXCISIONAL BIOPSY Right    benign/ 81 years old   BUNIONECTOMY     left   CATARACT EXTRACTION W/ INTRAOCULAR LENS  IMPLANT, BILATERAL  2013   cool sculpting     both arms and back   DILATION AND CURETTAGE OF UTERUS     EYE SURGERY     HAND CONTRACTURE RELEASE     trigger finger   KNEE ARTHROSCOPY WITH MEDIAL MENISECTOMY Right 04/11/2016   Procedure: KNEE ARTHROSCOPY WITH MEDIAL MENISECTOMY AND CONDROPLASTY;  Surgeon: Gaynelle Arabian, MD;  Location: [facility];  Service: Orthopedics;  Laterality: Right;   liposuction (15 yrs ago     stomach   LUMBAR LAMINECTOMY/DECOMPRESSION MICRODISCECTOMY N/A 02/17/2021   Procedure: Microlumbar Decompression Lumbar one-two, Lumbar two-three, Lumbar three-four;  Surgeon: Susa Day, MD;  Location: [facility];  Service: Orthopedics;  Laterality: N/A;   PARTIAL KNEE ARTHROPLASTY Right 03/28/2017   Procedure: UNICOMPARTMENTAL RIGHT  KNEE;  Surgeon: Gaynelle Arabian, MD;  Location: [facility];  Service: Orthopedics;  Laterality: Right;  Adductor Block   TUBAL LIGATION  1978   Family History  Problem Relation Age of Onset   Alzheimer's disease Mother    Heart disease Father    Heart attack Father    Diabetes Sister    Diabetes Brother    Cancer Daughter        Non-Hodgkins Lymphoma   Diabetes Paternal Aunt    Cancer Grandson        Leukemia   Diabetes Grandson  Type 1   Epilepsy Grandson    Breast cancer Neg Hx    Outpatient Medications Prior to Visit  Medication Sig Dispense Refill   acetaminophen (TYLENOL) 500 MG tablet Take 1,000 mg by mouth at bedtime.     ALPRAZolam (XANAX) 0.5 MG tablet Take 0.5 mg by mouth at bedtime as needed for anxiety or sleep.     traMADol (ULTRAM) 50 MG tablet      FLUoxetine (PROZAC) 40 MG capsule Take 1 capsule (40 mg total) by mouth daily. 30 capsule 3   No facility-administered medications prior to visit.   Allergies  Allergen Reactions   Sulfa  Antibiotics Swelling    Mouth blistered,      Objective:   Today's Vitals   09/04/22 1050  BP: 124/78  Pulse: 76  Temp: 97.6 F (36.4 C)  TempSrc: Temporal  SpO2: 98%  Weight: 111 lb 9.6 oz (50.6 kg)  Height: '5\' 2"'$  (1.575 m)   Body mass index is 20.41 kg/m.   General: Well developed, well nourished. No acute distress. Psych: Alert and oriented. Normal mood and affect.  Health Maintenance Due  Topic Date Due   Medicare Annual Wellness (AWV)  08/22/2022        09/04/2022   10:58 AM 06/22/2022    8:41 AM 05/26/2022    9:54 AM  Depression screen PHQ 2/9  Decreased Interest '3 1 2  '$ Down, Depressed, Hopeless '3 3 3  '$ PHQ - 2 Score '6 4 5  '$ Altered sleeping 2  0  Tired, decreased energy 3  0  Change in appetite 3  2  Feeling bad or failure about yourself  3  1  Trouble concentrating 0  1  Moving slowly or fidgety/restless 0  0  Suicidal thoughts 0  0  PHQ-9 Score 17  9  Difficult doing work/chores Extremely dIfficult  Somewhat difficult      09/04/2022   10:58 AM 04/10/2022    9:50 AM 02/27/2022   10:50 AM  GAD 7 : Generalized Anxiety Score  Nervous, Anxious, on Edge '3 2 3  '$ Control/stop worrying '3 3 3  '$ Worry too much - different things '3 3 3  '$ Trouble relaxing '3 2 3  '$ Restless '2 2 1  '$ Easily annoyed or irritable '2 3 2  '$ Afraid - awful might happen '3 3 3  '$ Total GAD 7 Score '19 18 18  '$ Anxiety Difficulty Extremely difficult Very difficult Somewhat difficult   Assessment & Plan:   1. Depression, recurrent ([place]) The increased stressors currently are making things more difficult for Lisa Oconnell. I do recommend that we increase her fluoxetine dose to 60 mg daily. I did offer a counseling referral, but she has hesitancies about how this might be used against in her divorce proceedings. She will let me know if she decides to pursue this.  - FLUoxetine (PROZAC) 40 MG capsule; Take 1 capsule (40 mg total) by mouth daily. Take together with 20 mg tablet (total daily dose of 60 mg a day)   Dispense: 30 capsule; Refill: 3 - FLUoxetine (PROZAC) 20 MG capsule; Take 1 capsule (20 mg total) by mouth daily. Take together with 40 mg capsule (total daily dose of 60 mg daily).  Dispense: 90 capsule; Refill: 3   Return in about 4 weeks (around 10/02/2022) for Reassessment.   Haydee Salter, MD

## 2022-10-05 ENCOUNTER — Ambulatory Visit (INDEPENDENT_AMBULATORY_CARE_PROVIDER_SITE_OTHER): Payer: Medicare HMO | Admitting: Family Medicine

## 2022-10-05 ENCOUNTER — Encounter: Payer: Self-pay | Admitting: Family Medicine

## 2022-10-05 VITALS — BP 134/76 | HR 74 | Temp 97.5°F | Ht 62.0 in | Wt 111.2 lb

## 2022-10-05 DIAGNOSIS — F339 Major depressive disorder, recurrent, unspecified: Secondary | ICD-10-CM | POA: Diagnosis not present

## 2022-10-05 DIAGNOSIS — L309 Dermatitis, unspecified: Secondary | ICD-10-CM | POA: Diagnosis not present

## 2022-10-05 DIAGNOSIS — Z635 Disruption of family by separation and divorce: Secondary | ICD-10-CM | POA: Diagnosis not present

## 2022-10-05 DIAGNOSIS — R69 Illness, unspecified: Secondary | ICD-10-CM | POA: Diagnosis not present

## 2022-10-05 MED ORDER — METHYLPREDNISOLONE 4 MG PO TBPK: ORAL_TABLET | ORAL | 0 refills | Status: DC

## 2022-10-05 NOTE — Progress Notes (Signed)
[facility] [address] [place] [address] Dept: [phone] Dept Fax: [phone]  Chronic Care Office Visit  Subjective:    Patient ID: Lisa Oconnell, female    DOB: December 11, 1940, 81 y.o..   MRN: [id number]  Chief Complaint  Patient presents with   Follow-up    4 week f/u.  C/o having red spots on legs, back that itches.     History of Present Illness:  Patient is in today for reassessment of chronic medical issues.  Ms. Gasiorowski has a history of depression and anxiety. She and her husband are slowly going through steps to have a divorce. Since her last visit, her husband has established a new condo in [place] (paid for by his daughter). He continues to live in their home, in the meantime. She finds this situation to be very stressful, as he exhibits behaviors that appear oblivious to their current marital situation. Previously, he had been very controlling of their finances and had taken care of routine things like buying groceries/supplies, fueling their cars, and paying household bills. She now has great concern about this, as these are activities she had never been allowed to manage. Her daughter has moved in 10 houses down the street, so is closer by to give her more support. Ms. Begin notes her mood holds together as long as she is not talking about how she is feeling. She continues to have some depressive feelings.  Ms. Canino notes she has developed a generalized scaliness/dryness of her skin. She is having some itching, though she tries to avoid scratching at these areas. She is using lotions, but without effect.  Past Medical History: Patient Active Problem List   Diagnosis Date Noted   Depression, recurrent ([place]) 02/27/2022   Generalized anxiety disorder 02/27/2022   Insomnia 02/27/2022   History of tuberculosis 02/27/2022   History of colon polyps 02/27/2022   Hyperlipidemia 02/27/2022   Osteopenia 09/32/3557    Lichen simplex chronicus 02/27/2022   Sebopsoriasis 02/26/2022   Sacroiliac joint pain 02/10/2022   Lumbar spondylosis 01/16/2022   HNP (herniated nucleus pulposus), lumbar 02/17/2021   Neurogenic claudication 01/25/2021   Degenerative lumbar spinal stenosis 01/25/2021   History of prosthetic unicompartmental arthroplasty of right knee 04/01/2020   OA (osteoarthritis) of knee 03/28/2017   Acute medial meniscal tear 04/11/2016   Past Surgical History:  Procedure Laterality Date   BREAST EXCISIONAL BIOPSY Right    benign/ 81 years old   BUNIONECTOMY     left   CATARACT EXTRACTION W/ INTRAOCULAR LENS  IMPLANT, BILATERAL  2013   cool sculpting     both arms and back   DILATION AND CURETTAGE OF UTERUS     EYE SURGERY     HAND CONTRACTURE RELEASE     trigger finger   KNEE ARTHROSCOPY WITH MEDIAL MENISECTOMY Right 04/11/2016   Procedure: KNEE ARTHROSCOPY WITH MEDIAL MENISECTOMY AND CONDROPLASTY;  Surgeon: Gaynelle Arabian, MD;  [facility];  Service: Orthopedics;  Laterality: Right;   liposuction (15 yrs ago     stomach   LUMBAR LAMINECTOMY/DECOMPRESSION MICRODISCECTOMY N/A 02/17/2021   Procedure: Microlumbar Decompression Lumbar one-two, Lumbar two-three, Lumbar three-four;  Surgeon: Susa Day, MD;  Location: [facility];  Service: Orthopedics;  Laterality: N/A;   PARTIAL KNEE ARTHROPLASTY Right 03/28/2017   Procedure: UNICOMPARTMENTAL RIGHT  KNEE;  Surgeon: Gaynelle Arabian, MD;  [facility];  Service: Orthopedics;  Laterality: Right;  [place]  Family History  Problem Relation Age of Onset   Alzheimer's disease Mother    Heart disease Father    Heart attack Father    Diabetes Sister    Diabetes Brother    Cancer Daughter        Non-Hodgkins Lymphoma   Diabetes Paternal Aunt    Cancer Grandson        Leukemia   Diabetes Grandson        Type 1   Epilepsy Grandson    Breast cancer Neg Hx    Outpatient Medications Prior to Visit  Medication  Sig Dispense Refill   acetaminophen (TYLENOL) 500 MG tablet Take 1,000 mg by mouth at bedtime.     ALPRAZolam (XANAX) 0.5 MG tablet Take 0.5 mg by mouth at bedtime as needed for anxiety or sleep.     FLUoxetine (PROZAC) 20 MG capsule Take 1 capsule (20 mg total) by mouth daily. Take together with 40 mg capsule (total daily dose of 60 mg daily). 90 capsule 3   FLUoxetine (PROZAC) 40 MG capsule Take 1 capsule (40 mg total) by mouth daily. Take together with 20 mg tablet (total daily dose of 60 mg a day) 30 capsule 3   traMADol (ULTRAM) 50 MG tablet      No facility-administered medications prior to visit.   Allergies  Allergen Reactions   Sulfa Antibiotics Swelling    Mouth blistered,       Objective:   Today's Vitals   10/05/22 1015  BP: 134/76  Pulse: 74  Temp: (!) 97.5 F (36.4 C)  TempSrc: Temporal  SpO2: 98%  Weight: 111 lb 3.2 oz (50.4 kg)  Height: '5\' 2"'$  (1.575 m)   Body mass index is 20.34 kg/m.   General: Well developed, well nourished. No acute distress. Skin: Warm and dry. Generalized dry skin with slight ichthyotic appearance. There are a   scattering of small red excoriated papular lesions. Psych: Alert and oriented. Normal mood and affect, except mild tearfulness.  Health Maintenance Due  Topic Date Due   Medicare Annual Wellness (AWV)  08/22/2022     Assessment & Plan:   1. Depression, recurrent ([place]) 2. Marital conflict involving divorce Ms. Fagerstrom remains under stress related to her marital issues. I will have her continue on fluoxetine 60 mg daily. I will continue to see her on a regular basis for counseling/support while she goes through her divorce.  3. Dermatitis The etiology of the skin issues is uncertain. I recommend she liberally apply hydrophilic creams/lotions/ointment to her skin.  I will have her try a course of prednisoneto see if this will resolve the papular lesions and reduce her tiching.  - methylPREDNISolone (MEDROL DOSEPAK) 4 MG TBPK  tablet; Take per package instructions  Dispense: 21 tablet; Refill: 0   Return in about 6 weeks (around 11/16/2022) for Reassessment.   Haydee Salter, MD

## 2022-10-05 NOTE — Patient Instructions (Signed)
Use a hydrophilic cream/lotion/ointment. Apply liberally to skin at least twice a day.

## 2022-10-22 IMAGING — XA DG MYELOGRAPHY LUMBAR INJ LUMBOSACRAL
14 series · 14 of 14 positions shown · non-contrast
Comparison: None

CLINICAL DATA: Left-sided low back pain with some radiation to the
left leg.
TECHNIQUE: Contiguous axial images were obtained through the Lumbar spine after
the intrathecal infusion of infusion. Coronal and sagittal
reconstructions were obtained of the axial image sets.

[Series 1: w lumbar spine ap · 0.15mm/px · 1 of 1 slices shown (1 of 3)]
[im 1/1]
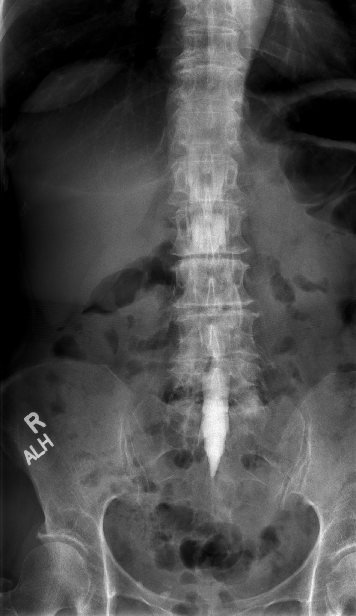

[Series 1: vasc standard · 1 of 1 slices shown (1 of 8)]
[im 1/1]
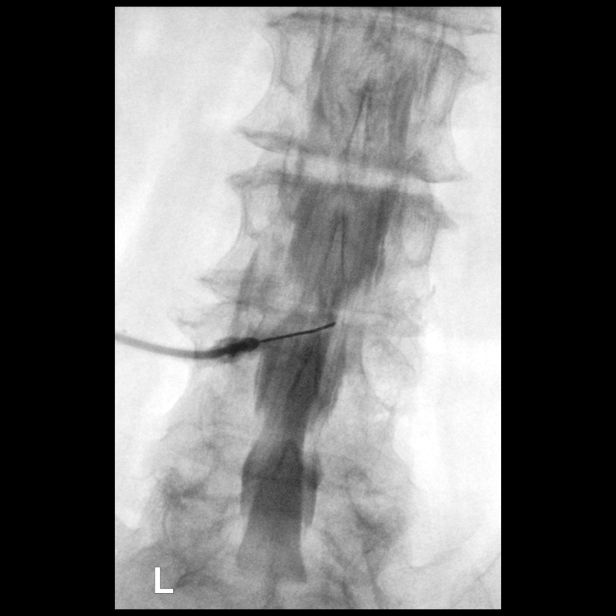

[Series 2: vasc standard · 1 of 1 slices shown (2 of 8)]
[im 1/1]
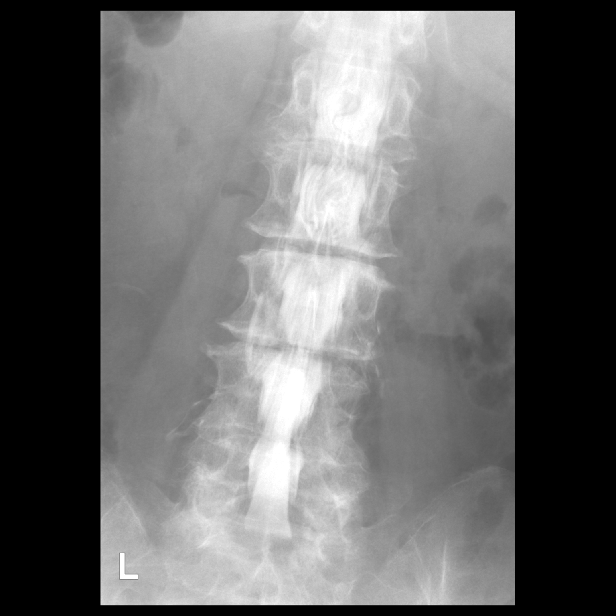

[Series 2: w lumbar spine ap · 0.15mm/px · 1 of 1 slices shown (2 of 3)]
[im 1/1]
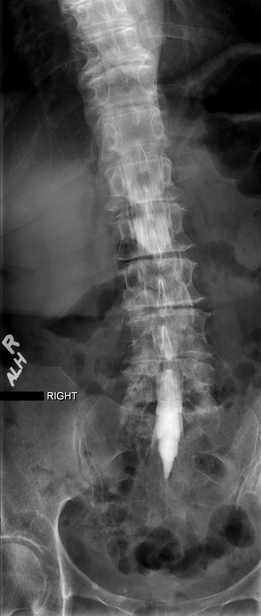

[Series 3: vasc standard · 1 of 1 slices shown (3 of 8)]
[im 1/1]
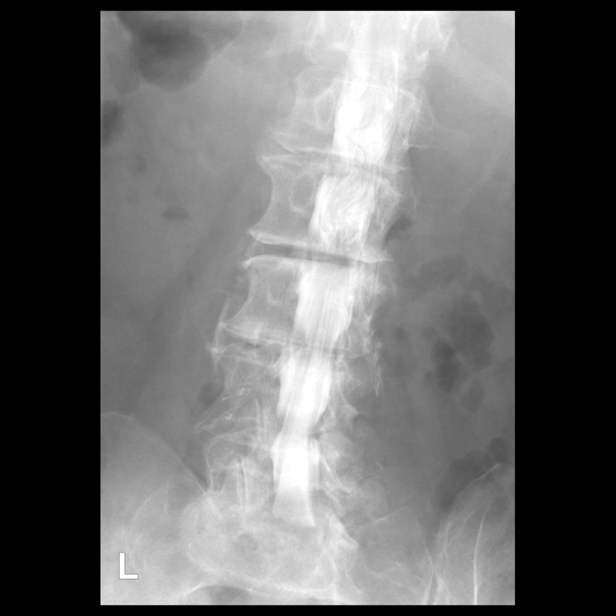

[Series 3: w lumbar spine ap · 0.15mm/px · 1 of 1 slices shown (3 of 3)]
[im 1/1]
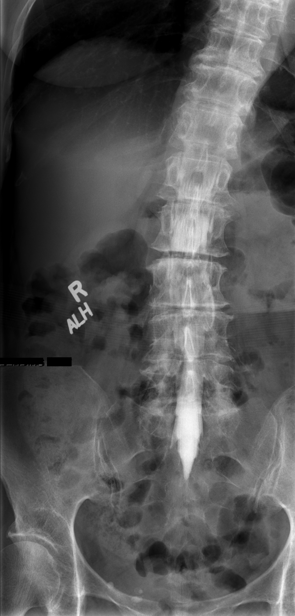

[Series 4: w lumbar spine lat · 0.15mm/px · 1 of 1 slices shown]
[im 1/1]
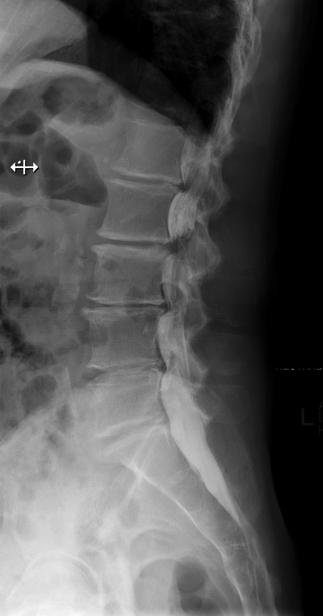

[Series 4: vasc standard · 1 of 1 slices shown (4 of 8)]
[im 1/1]
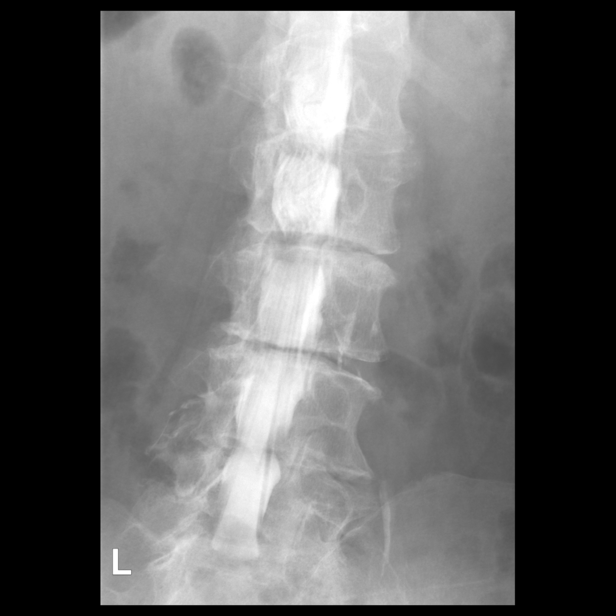

[Series 5: w lumbar spine flexion · 0.15mm/px · 1 of 1 slices shown]
[im 1/1]
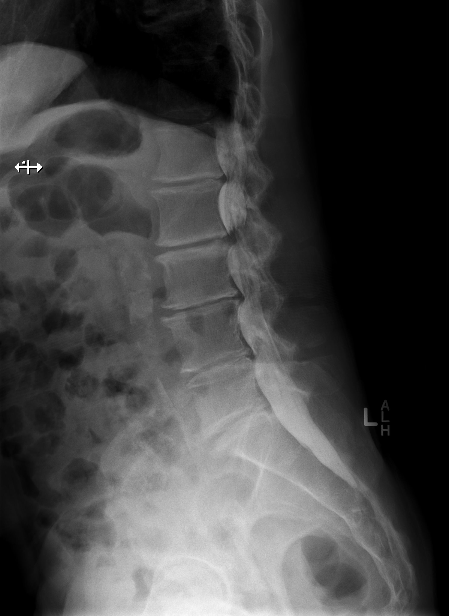

[Series 5: vasc standard · 1 of 1 slices shown (5 of 8)]
[im 1/1]
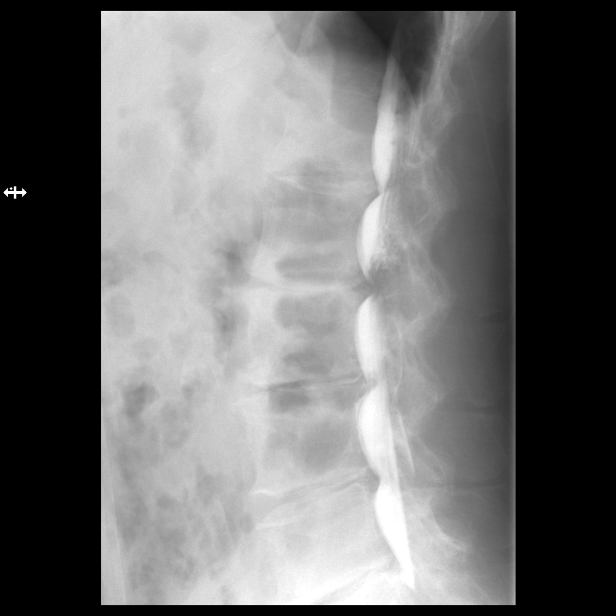

[Series 6: vasc standard · 1 of 1 slices shown (6 of 8)]
[im 1/1]
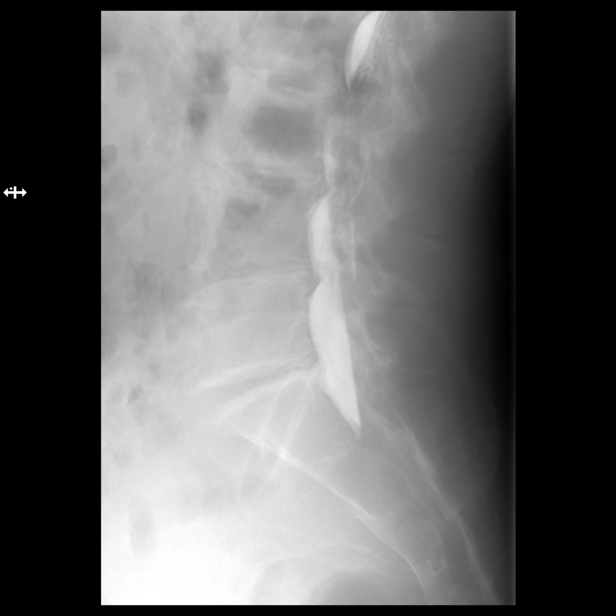

[Series 6: w lumbar spine extension · 0.15mm/px · 1 of 1 slices shown]
[im 1/1]
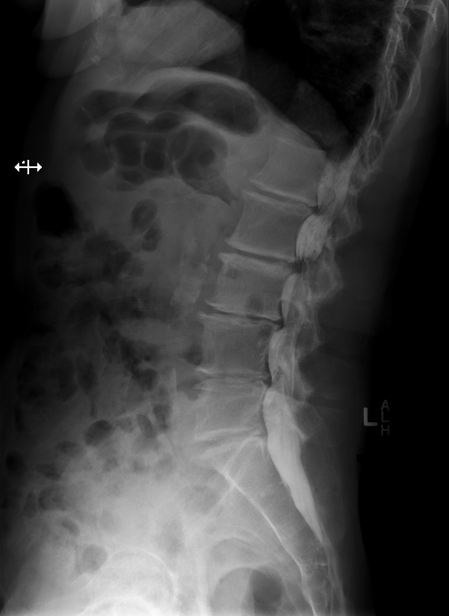

[Series 7: vasc standard · 1 of 1 slices shown (7 of 8)]
[im 1/1]
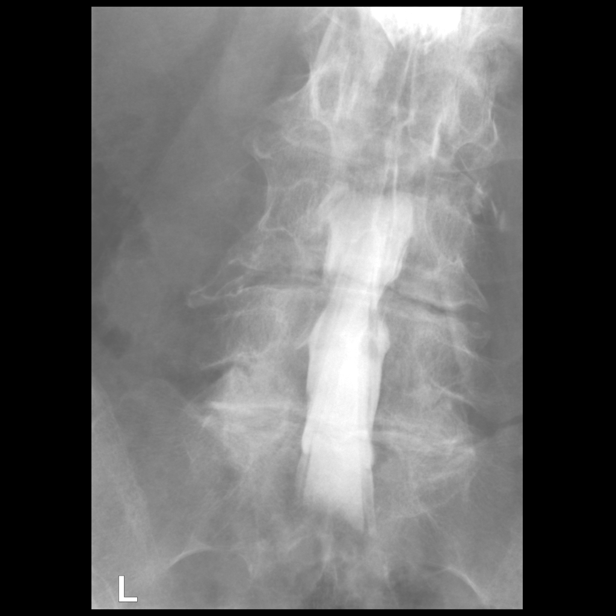

[Series 8: vasc standard · 1 of 1 slices shown (8 of 8)]
[im 1/1]
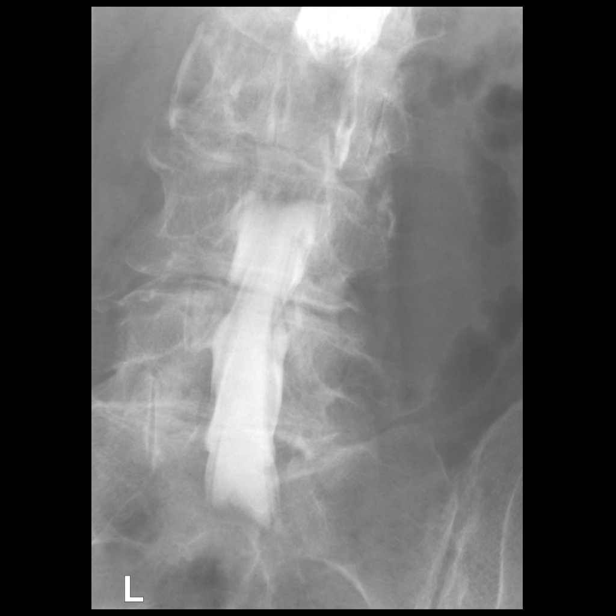

[14 of 14 positions shown; findings below may reference images not displayed]

EXAM:
LUMBAR MYELOGRAM

FLUOROSCOPY TIME:  1 minutes 2 seconds. 155.34 micro gray meter
squared

PROCEDURE:
After thorough discussion of risks and benefits of the procedure
including bleeding, infection, injury to nerves, blood vessels,
adjacent structures as well as headache and CSF leak, written and
oral informed consent was obtained. Consent was obtained by Dr. Ahmadi
Zawati. Time out form was completed.

Patient was positioned prone on the fluoroscopy table. Local
anesthesia was provided with 1% lidocaine without epinephrine after
prepped and draped in the usual sterile fashion. Puncture was
performed at L3-4 using a 3 1/2 inch 22-gauge spinal needle via left
paramedian approach. Using a single pass through the dura, the
needle was placed within the thecal sac, with return of clear CSF.
15 mL of Isovue A-3FF was injected into the thecal sac, with normal
opacification of the nerve roots and cauda equina consistent with
free flow within the subarachnoid space.

I personally performed the lumbar puncture and administered the
intrathecal contrast. I also personally performed acquisition of the
myelogram images.
FINDINGS: LUMBAR MYELOGRAM FINDINGS:

There are 5 lumbar type vertebral bodies. No significant scoliotic
curvature. There are small anterior extradural defects from L1-2
through L5-S1. This is largest at the L2-3 level. Mild canal
narrowing is present at L1-2. Moderate to severe canal narrowing is
present at L2-3, left worse than right. Bilateral lateral recess
narrowing at present L3-4 and L4-5. No compressive finding at L5-S1.
Standing lateral flexion extension views do not show any
subluxation. Right-to-left bending views not demonstrate any
significant new finding.

CT LUMBAR MYELOGRAM FINDINGS:

T12-L1: No disc abnormality. Minimal ligamentous prominence. No
stenosis.

L1-2: Chronic disc degeneration with loss of disc height. Endplate
osteophytes and bulging of the disc. Mild facet and ligamentous
prominence. Mild stenosis of the canal and lateral recesses but
without definite neural compression.

L2-3: Disc degeneration with loss of disc height. Endplate
osteophytes and circumferential protrusion of the disc. Facet and
ligamentous prominence. Severe multifactorial stenosis at this level
that could cause neural compression on either or both sides.

L3-4: Disc degeneration with loss of disc height. Endplate
osteophytes and bulging of the disc. Mild facet and ligamentous
hypertrophy. Stenosis of the lateral recesses left more than right.
Neural compression could occur at this level, particularly in the
left lateral recess.

L4-5: Disc degeneration with loss of disc height. Endplate
osteophytes and mild bulging of the disc. Mild facet hypertrophy.
Mild stenosis of both lateral recesses but without apparent neural
compression.

L5-S1: Disc degeneration with loss of disc height. Endplate
osteophytes and bulging of the disc. No compressive narrowing of the
canal or foramina. There is only mild facet osteoarthritis.

Minimal osteoarthritis of both sacroiliac joints.
IMPRESSION: 1. L2-3: Moderate to severe multifactorial spinal stenosis likely to
cause neural compression. Contributing factors include
circumferential protrusion of the disc in combination with facet and
ligamentous hypertrophy.
2. L3-4: Left lateral recess stenosis that could cause neural
compression. Endplate osteophytes and bulging of the disc more
prominent towards the left. Mild facet and ligamentous hypertrophy.
3. L1-2 and L4-5: Chronic disc degeneration with endplate
osteophytes and bulging of the disc. Mild facet and ligamentous
hypertrophy. Mild stenosis the lateral recesses at these 2 levels
but no definite neural compression.
4. L5-S1: Chronic disc degeneration with loss of height. No canal or
foraminal stenosis. Mild facet osteoarthritis.
5. Mild bilateral sacroiliac osteoarthritis.
6. No abnormal motion demonstrated with either right-to-left bending
or flexion extension.

## 2022-10-24 ENCOUNTER — Telehealth: Payer: Self-pay | Admitting: Family Medicine

## 2022-10-24 NOTE — Telephone Encounter (Signed)
Pt called and stated she took her blood pressure and it read   152/86 and 141/87 pt stated what should she do

## 2022-10-24 NOTE — Telephone Encounter (Signed)
Spoke to patient and advised to keep an eye on the BP and call us back. Dm/cma

## 2022-11-21 ENCOUNTER — Encounter: Payer: Self-pay | Admitting: Family Medicine

## 2022-11-21 ENCOUNTER — Ambulatory Visit (INDEPENDENT_AMBULATORY_CARE_PROVIDER_SITE_OTHER): Payer: Medicare HMO | Admitting: Family Medicine

## 2022-11-21 VITALS — BP 124/76 | HR 63 | Temp 97.0°F | Ht 62.0 in | Wt 107.6 lb

## 2022-11-21 DIAGNOSIS — Z635 Disruption of family by separation and divorce: Secondary | ICD-10-CM

## 2022-11-21 DIAGNOSIS — H9313 Tinnitus, bilateral: Secondary | ICD-10-CM | POA: Diagnosis not present

## 2022-11-21 DIAGNOSIS — G4700 Insomnia, unspecified: Secondary | ICD-10-CM | POA: Diagnosis not present

## 2022-11-21 DIAGNOSIS — F339 Major depressive disorder, recurrent, unspecified: Secondary | ICD-10-CM

## 2022-11-21 DIAGNOSIS — R69 Illness, unspecified: Secondary | ICD-10-CM | POA: Diagnosis not present

## 2022-11-21 MED ORDER — ZOLPIDEM TARTRATE 5 MG PO TABS: 5.0000 mg | ORAL_TABLET | Freq: Every evening | ORAL | 1 refills | Status: DC | PRN

## 2022-11-21 NOTE — Progress Notes (Signed)
[facility] [address] [place] [address] Dept: [phone] Dept Fax: [phone]  Chronic Care Office Visit  Subjective:    Patient ID: Lisa Oconnell, female    DOB: 1941/07/16, 82 y.o..   MRN: [id number]  Chief Complaint  Patient presents with   Follow-up    6 week f/u.     History of Present Illness:  Patient is in today for reassessment of chronic medical issues.  Lisa Oconnell has a history of depression and anxiety. She and Lisa Oconnell husband are slowly going through steps to have a divorce. She has been ready for him to move on, but he refuses to leave their home, even though he has established a condo in [place]. Currently, she notes that he is seeking ownership of their pet dog, which Lisa Oconnell had previously known would be an issue. His presence and behaviors continue to be quite stressful for Lisa Oconnell. Lisa Oconnell continues to have depression triggered by these events. She does note she is not sleeping well. She has a history of insomnia. She has been using Xanax, but it doesn't always help Lisa Oconnell get to sleep and she finds she has early morning awakening.  Lisa Oconnell also complains of ringing in Lisa Oconnell ears fort the past year. She has not noted any hearing loss and has not had issues with sudden vertigo. she does not take aspirin regularly.  Past Medical History: Patient Active Problem List   Diagnosis Date Noted   Tinnitus aurium, bilateral 11/21/2022   Depression, recurrent ([place]) 02/27/2022   Generalized anxiety disorder 02/27/2022   Insomnia 02/27/2022   History of tuberculosis 02/27/2022   History of colon polyps 02/27/2022   Hyperlipidemia 02/27/2022   Osteopenia 75/08/2584   Lichen simplex chronicus 02/27/2022   Sebopsoriasis 02/26/2022   Sacroiliac joint pain 02/10/2022   Lumbar spondylosis 01/16/2022   HNP (herniated nucleus pulposus), lumbar 02/17/2021   Neurogenic claudication 01/25/2021   Degenerative lumbar  spinal stenosis 01/25/2021   History of prosthetic unicompartmental arthroplasty of right knee 04/01/2020   OA (osteoarthritis) of knee 03/28/2017   Acute medial meniscal tear 04/11/2016   Past Surgical History:  Procedure Laterality Date   BREAST EXCISIONAL BIOPSY Right    benign/ 82 years old   BUNIONECTOMY     left   CATARACT EXTRACTION W/ INTRAOCULAR LENS  IMPLANT, BILATERAL  2013   cool sculpting     both arms and back   DILATION AND CURETTAGE OF UTERUS     EYE SURGERY     HAND CONTRACTURE RELEASE     trigger finger   KNEE ARTHROSCOPY WITH MEDIAL MENISECTOMY Right 04/11/2016   Procedure: KNEE ARTHROSCOPY WITH MEDIAL MENISECTOMY AND CONDROPLASTY;  Surgeon: Gaynelle Arabian, MD;  [facility];  Service: Orthopedics;  Laterality: Right;   liposuction (15 yrs ago     stomach   LUMBAR LAMINECTOMY/DECOMPRESSION MICRODISCECTOMY N/A 02/17/2021   Procedure: Microlumbar Decompression Lumbar one-two, Lumbar two-three, Lumbar three-four;  Surgeon: Susa Day, MD;  Location: [facility];  Service: Orthopedics;  Laterality: N/A;   PARTIAL KNEE ARTHROPLASTY Right 03/28/2017   Procedure: UNICOMPARTMENTAL RIGHT  KNEE;  Surgeon: Gaynelle Arabian, MD;  [facility];  Service: Orthopedics;  Laterality: Right;  Adductor Block   TUBAL LIGATION  1978   Family History  Problem Relation Age of Onset   Alzheimer's disease Mother    Heart disease Father    Heart attack Father    Diabetes Sister    Diabetes Brother  Cancer Daughter        Non-Hodgkins Lymphoma   Diabetes Paternal Aunt    Cancer Grandson        Leukemia   Diabetes Grandson        Type 1   Epilepsy Grandson    Breast cancer Neg Hx    Outpatient Medications Prior to Visit  Medication Sig Dispense Refill   acetaminophen (TYLENOL) 500 MG tablet Take 1,000 mg by mouth at bedtime.     ALPRAZolam (XANAX) 0.5 MG tablet Take 0.5 mg by mouth at bedtime as needed for anxiety or sleep.     FLUoxetine (PROZAC) 20 MG capsule Take 1 capsule  (20 mg total) by mouth daily. Take together with 40 mg capsule (total daily dose of 60 mg daily). 90 capsule 3   FLUoxetine (PROZAC) 40 MG capsule Take 1 capsule (40 mg total) by mouth daily. Take together with 20 mg tablet (total daily dose of 60 mg a day) 30 capsule 3   methylPREDNISolone (MEDROL DOSEPAK) 4 MG TBPK tablet Take per package instructions 21 tablet 0   traMADol (ULTRAM) 50 MG tablet      No facility-administered medications prior to visit.   Allergies  Allergen Reactions   Sulfa Antibiotics Swelling    Mouth blistered,     Objective:   Today's Vitals   11/21/22 1042  BP: 124/76  Pulse: 63  Temp: (!) 97 F (36.1 C)  TempSrc: Temporal  SpO2: 98%  Weight: 107 lb 9.6 oz (48.8 kg)  Height: '5\' 2"'$  (1.575 m)   Body mass index is 19.68 kg/m.   General: Well developed, well nourished. No acute distress. Ears: External ears are normal. EAC and TMS are normal bilaterally. Psych: Alert and oriented. Depressed mood and sad affect with tearfulness.  Health Maintenance Due  Topic Date Due   Medicare Annual Wellness (AWV)  08/22/2022     Assessment & Plan:   1. Depression, recurrent ([place]) 2. Marital conflict involving divorce Continued issues with depression. I will have Lisa Oconnell continue fluoxetine 60 mg daily. I will continue to see Lisa Oconnell on a regular basis for counseling/support while she goes through Lisa Oconnell divorce.   3. Tinnitus aurium, bilateral Discussed chronic nature of tinnitus. She should avoid loud noises to help prevent worsening.   4. Insomnia, unspecified type Xanax does not appear to be managing the insomnia well, though it still plays a role in management of Lisa Oconnell chronic anxiety. I will give Lisa Oconnell a trial fo Ambien to see if this improves Lisa Oconnell sleep and reduces early morning awakenings.  - zolpidem (AMBIEN) 5 MG tablet; Take 1 tablet (5 mg total) by mouth at bedtime as needed for sleep.  Dispense: 30 tablet; Refill: 1   Return in about 6 weeks (around 01/02/2023)  for Reassessment.   Haydee Salter, MD

## 2022-11-23 ENCOUNTER — Telehealth: Payer: Self-pay | Admitting: Family Medicine

## 2022-11-23 NOTE — Telephone Encounter (Signed)
Pt stated she taken ambien last night xanax today and when she took a bath she is shaking  a few minutes ago and not feeling at her best. Pt want you to call her she do not know what to do

## 2022-11-23 NOTE — Telephone Encounter (Signed)
Lft VM to rtn call. Dm/cma  

## 2022-11-23 NOTE — Telephone Encounter (Signed)
Noted. Dm/cma  

## 2022-11-23 NOTE — Telephone Encounter (Signed)
Pt called and stated that she feeling better now

## 2022-12-17 ENCOUNTER — Other Ambulatory Visit: Payer: Self-pay | Admitting: Family Medicine

## 2022-12-17 DIAGNOSIS — F339 Major depressive disorder, recurrent, unspecified: Secondary | ICD-10-CM

## 2023-01-02 ENCOUNTER — Ambulatory Visit (INDEPENDENT_AMBULATORY_CARE_PROVIDER_SITE_OTHER): Payer: Medicare HMO | Admitting: Family Medicine

## 2023-01-02 ENCOUNTER — Encounter: Payer: Self-pay | Admitting: Family Medicine

## 2023-01-02 VITALS — BP 120/66 | HR 63 | Temp 98.4°F | Ht 62.0 in | Wt 112.2 lb

## 2023-01-02 DIAGNOSIS — R69 Illness, unspecified: Secondary | ICD-10-CM | POA: Diagnosis not present

## 2023-01-02 DIAGNOSIS — F411 Generalized anxiety disorder: Secondary | ICD-10-CM | POA: Diagnosis not present

## 2023-01-02 DIAGNOSIS — F339 Major depressive disorder, recurrent, unspecified: Secondary | ICD-10-CM

## 2023-01-02 DIAGNOSIS — Z635 Disruption of family by separation and divorce: Secondary | ICD-10-CM

## 2023-01-02 NOTE — Assessment & Plan Note (Addendum)
Anxiety symptoms are reported as stable, but her visible affect and mood appear improved. I will provide her with another copy of the previous letter related to her dog.

## 2023-01-02 NOTE — Assessment & Plan Note (Signed)
Depressive symptoms appear to be doing better at present. I will have her continue fluoxetine 60 mg daily.

## 2023-01-02 NOTE — Progress Notes (Signed)
[facility] [address] [place] [address] Dept: [phone] Dept Fax: [phone]  Chronic Care Office Visit  Subjective:    Patient ID: Lisa Oconnell, female    DOB: 01-26-1941, 82 y.o..   MRN: [id number]  Chief Complaint  Patient presents with   Medical Management of Chronic Issues    6 week f/u.    History of Present Illness:  Patient is in today for reassessment of chronic medical issues.  Lisa Oconnell has a history of depression and anxiety. She and her husband are slowly going through steps to have a divorce. She notes that she feel more stable with her anxiety/depression at this point, as they now have a date for mediation in early March. She notes he continues to live int he home and is dragging his heels about moving out. He is trying to contest her keeping their dog. I had previously written a letter in support of her keeping the dog in light of her depression. She notes the letter I wrote has been removed from her room she feels her husband took this. She feels some of her financial worries have been resolved by a niece offering to buy her home and giving her a lifetime right to stay there. Lisa Oconnell had been having issues with insomnia. I tried prescribing Ambien, but she did not tolerate this.  Past Medical History: Patient Active Problem List   Diagnosis Date Noted   Tinnitus aurium, bilateral 11/21/2022   Depression, recurrent ([place]) 02/27/2022   Generalized anxiety disorder 02/27/2022   Insomnia 02/27/2022   History of tuberculosis 02/27/2022   History of colon polyps 02/27/2022   Hyperlipidemia 02/27/2022   Osteopenia 99991111   Lichen simplex chronicus 02/27/2022   Sebopsoriasis 02/26/2022   Sacroiliac joint pain 02/10/2022   Lumbar spondylosis 01/16/2022   HNP (herniated nucleus pulposus), lumbar 02/17/2021   Neurogenic claudication 01/25/2021   Degenerative lumbar spinal stenosis 01/25/2021    History of prosthetic unicompartmental arthroplasty of right knee 04/01/2020   OA (osteoarthritis) of knee 03/28/2017   Acute medial meniscal tear 04/11/2016   Past Surgical History:  Procedure Laterality Date   BREAST EXCISIONAL BIOPSY Right    benign/ 82 years old   BUNIONECTOMY     left   CATARACT EXTRACTION W/ INTRAOCULAR LENS  IMPLANT, BILATERAL  2013   cool sculpting     both arms and back   DILATION AND CURETTAGE OF UTERUS     EYE SURGERY     HAND CONTRACTURE RELEASE     trigger finger   KNEE ARTHROSCOPY WITH MEDIAL MENISECTOMY Right 04/11/2016   Procedure: KNEE ARTHROSCOPY WITH MEDIAL MENISECTOMY AND CONDROPLASTY;  Surgeon: Gaynelle Arabian, MD;  Location: [facility];  Service: Orthopedics;  Laterality: Right;   liposuction (15 yrs ago     stomach   LUMBAR LAMINECTOMY/DECOMPRESSION MICRODISCECTOMY N/A 02/17/2021   Procedure: Microlumbar Decompression Lumbar one-two, Lumbar two-three, Lumbar three-four;  Surgeon: Susa Day, MD;  Location: [facility];  Service: Orthopedics;  Laterality: N/A;   PARTIAL KNEE ARTHROPLASTY Right 03/28/2017   Procedure: UNICOMPARTMENTAL RIGHT  KNEE;  Surgeon: Gaynelle Arabian, MD;  Location: [facility];  Service: Orthopedics;  Laterality: Right;  Adductor Block   TUBAL LIGATION  1978   Family History  Problem Relation Age of Onset   Alzheimer's disease Mother    Heart disease Father    Heart attack Father    Diabetes Sister    Diabetes Brother    Cancer Daughter  Non-Hodgkins Lymphoma   Diabetes Paternal Aunt    Cancer Grandson        Leukemia   Diabetes Grandson        Type 1   Epilepsy Grandson    Breast cancer Neg Hx    Outpatient Medications Prior to Visit  Medication Sig Dispense Refill   acetaminophen (TYLENOL) 500 MG tablet Take 1,000 mg by mouth at bedtime.     ALPRAZolam (XANAX) 0.5 MG tablet Take 0.5 mg by mouth at bedtime as needed for anxiety or sleep.     FLUoxetine (PROZAC) 20 MG capsule Take 1 capsule (20 mg total) by mouth daily.  Take together with 40 mg capsule (total daily dose of 60 mg daily). 90 capsule 3   FLUoxetine (PROZAC) 40 MG capsule Take 1 capsule (40 mg total) by mouth daily. Take together with 20 mg tablet (total daily dose of 60 mg a day) 90 capsule 3   zolpidem (AMBIEN) 5 MG tablet Take 1 tablet (5 mg total) by mouth at bedtime as needed for sleep. (Patient not taking: Reported on 01/02/2023) 30 tablet 1   No facility-administered medications prior to visit.   Allergies  Allergen Reactions   Sulfa Antibiotics Swelling    Mouth blistered,    Objective:   Today's Vitals   01/02/23 0946  BP: 120/66  Pulse: 63  Temp: 98.4 F (36.9 C)  TempSrc: Temporal  SpO2: 98%  Weight: 112 lb 3.2 oz (50.9 kg)  Height: '5\' 2"'$  (1.575 m)   Body mass index is 20.52 kg/m.   General: Well developed, well nourished. No acute distress. Psych: Alert and oriented. Mood and affect are more normal today and there is no tearfulness..  There are no preventive care reminders to display for this patient.     01/02/2023    9:56 AM 09/04/2022   10:58 AM 06/22/2022    8:41 AM  Depression screen PHQ 2/9  Decreased Interest 0 3 1  Down, Depressed, Hopeless '3 3 3  '$ PHQ - 2 Score '3 6 4  '$ Altered sleeping 3 2   Tired, decreased energy 0 3   Change in appetite 0 3   Feeling bad or failure about yourself  1 3   Trouble concentrating 0 0   Moving slowly or fidgety/restless 0 0   Suicidal thoughts 2 0   PHQ-9 Score 9 17   Difficult doing work/chores Very difficult Extremely dIfficult       01/02/2023    9:56 AM 09/04/2022   10:58 AM 04/10/2022    9:50 AM 02/27/2022   10:50 AM  GAD 7 : Generalized Anxiety Score  Nervous, Anxious, on Edge '3 3 2 3  '$ Control/stop worrying '3 3 3 3  '$ Worry too much - different things '3 3 3 3  '$ Trouble relaxing '3 3 2 3  '$ Restless '3 2 2 1  '$ Easily annoyed or irritable '3 2 3 2  '$ Afraid - awful might happen '3 3 3 3  '$ Total GAD 7 Score '21 19 18 18  '$ Anxiety Difficulty Very difficult Extremely  difficult Very difficult Somewhat difficult     Assessment & Plan:   Problem List Items Addressed This Visit       Other   Depression, recurrent ([place])    Depressive symptoms appear to be doing better at present. I will have her continue fluoxetine 60 mg daily.      Generalized anxiety disorder - Primary    Anxiety symptoms are reported as stable, but  her visible affect and mood appear improved. I will provide her with another copy of the previous letter related to her dog.      Other Visit Diagnoses     Marital conflict involving divorce           Return in about 2 months (around 03/03/2023) for Reassessment.   Haydee Salter, MD

## 2023-01-16 ENCOUNTER — Other Ambulatory Visit: Payer: Self-pay

## 2023-02-21 ENCOUNTER — Telehealth: Payer: Self-pay | Admitting: Family Medicine

## 2023-02-21 NOTE — Telephone Encounter (Signed)
Contacted Lisa Oconnell to schedule their annual wellness visit. Appointment made for 02/26/23.  Lisa Oconnell AWV direct phone # [phone]

## 2023-02-26 ENCOUNTER — Ambulatory Visit (INDEPENDENT_AMBULATORY_CARE_PROVIDER_SITE_OTHER): Payer: Medicare HMO

## 2023-02-26 VITALS — Ht 62.0 in | Wt 103.0 lb

## 2023-02-26 DIAGNOSIS — Z0000 Encounter for general adult medical examination without abnormal findings: Secondary | ICD-10-CM

## 2023-02-26 NOTE — Patient Instructions (Signed)
Ms. Lisa Oconnell , Thank you for taking time to come for your Medicare Wellness Visit. I appreciate your ongoing commitment to your health goals. Please review the following plan we discussed and let me know if I can assist you in the future.   These are the goals we discussed:  Goals      Patient Stated     02/26/2023, wants to gain a little bit of weight        This is a list of the screening recommended for you and due dates:  Health Maintenance  Topic Date Due   COVID-19 Vaccine (5 - 2023-24 season) 07/07/2022   Flu Shot  06/07/2023   Medicare Annual Wellness Visit  02/26/2024   DTaP/Tdap/Td vaccine (4 - Td or Tdap) 05/04/2026   Pneumonia Vaccine  Completed   DEXA scan (bone density measurement)  Completed   Zoster (Shingles) Vaccine  Completed   HPV Vaccine  Aged Out    Advanced directives: Please bring a copy of your POA (Power of [place]) and/or Living Will to your next appointment.   Conditions/risks identified: none  Next appointment: Follow up in one year for your annual wellness visit    Preventive Care 65 Years and Older, Female Preventive care refers to lifestyle choices and visits with your health care provider that can promote health and wellness. What does preventive care include? A yearly physical exam. This is also called an annual well check. Dental exams once or twice a year. Routine eye exams. Ask your health care provider how often you should have your eyes checked. Personal lifestyle choices, including: Daily care of your teeth and gums. Regular physical activity. Eating a healthy diet. Avoiding tobacco and drug use. Limiting alcohol use. Practicing safe sex. Taking low-dose aspirin every day. Taking vitamin and mineral supplements as recommended by your health care provider. What happens during an annual well check? The services and screenings done by your health care provider during your annual well check will depend on your age, overall health,  lifestyle risk factors, and family history of disease. Counseling  Your health care provider may ask you questions about your: Alcohol use. Tobacco use. Drug use. Emotional well-being. Home and relationship well-being. Sexual activity. Eating habits. History of falls. Memory and ability to understand (cognition). Work and work Astronomer. Reproductive health. Screening  You may have the following tests or measurements: Height, weight, and BMI. Blood pressure. Lipid and cholesterol levels. These may be checked every 5 years, or more frequently if you are over 50 years old. Skin check. Lung cancer screening. You may have this screening every year starting at age 81 if you have a 30-pack-year history of smoking and currently smoke or have quit within the past 15 years. Fecal occult blood test (FOBT) of the stool. You may have this test every year starting at age 81. Flexible sigmoidoscopy or colonoscopy. You may have a sigmoidoscopy every 5 years or a colonoscopy every 10 years starting at age 81. Hepatitis C blood test. Hepatitis B blood test. Sexually transmitted disease (STD) testing. Diabetes screening. This is done by checking your blood sugar (glucose) after you have not eaten for a while (fasting). You may have this done every 1-3 years. Bone density scan. This is done to screen for osteoporosis. You may have this done starting at age 81. Mammogram. This may be done every 1-2 years. Talk to your health care provider about how often you should have regular mammograms. Talk with your health care provider about  your test results, treatment options, and if necessary, the need for more tests. Vaccines  Your health care provider may recommend certain vaccines, such as: Influenza vaccine. This is recommended every year. Tetanus, diphtheria, and acellular pertussis (Tdap, Td) vaccine. You may need a Td booster every 10 years. Zoster vaccine. You may need this after age  81. Pneumococcal 13-valent conjugate (PCV13) vaccine. One dose is recommended after age 81. Pneumococcal polysaccharide (PPSV23) vaccine. One dose is recommended after age 81. Talk to your health care provider about which screenings and vaccines you need and how often you need them. This information is not intended to replace advice given to you by your health care provider. Make sure you discuss any questions you have with your health care provider. Document Released: 11/19/2015 Document Revised: 07/12/2016 Document Reviewed: 08/24/2015 Elsevier Interactive Patient Education  2017 [place] Prevention in the Home Falls can cause injuries. They can happen to people of all ages. There are many things you can do to make your home safe and to help prevent falls. What can I do on the outside of my home? Regularly fix the edges of walkways and driveways and fix any cracks. Remove anything that might make you trip as you walk through a door, such as a raised step or threshold. Trim any bushes or trees on the path to your home. Use bright outdoor lighting. Clear any walking paths of anything that might make someone trip, such as rocks or tools. Regularly check to see if handrails are loose or broken. Make sure that both sides of any steps have handrails. Any raised decks and porches should have guardrails on the edges. Have any leaves, snow, or ice cleared regularly. Use sand or salt on walking paths during winter. Clean up any spills in your garage right away. This includes oil or grease spills. What can I do in the bathroom? Use night lights. Install grab bars by the toilet and in the tub and shower. Do not use towel bars as grab bars. Use non-skid mats or decals in the tub or shower. If you need to sit down in the shower, use a plastic, non-slip stool. Keep the floor dry. Clean up any water that spills on the floor as soon as it happens. Remove soap buildup in the tub or shower  regularly. Attach bath mats securely with double-sided non-slip rug tape. Do not have throw rugs and other things on the floor that can make you trip. What can I do in the bedroom? Use night lights. Make sure that you have a light by your bed that is easy to reach. Do not use any sheets or blankets that are too big for your bed. They should not hang down onto the floor. Have a firm chair that has side arms. You can use this for support while you get dressed. Do not have throw rugs and other things on the floor that can make you trip. What can I do in the kitchen? Clean up any spills right away. Avoid walking on wet floors. Keep items that you use a lot in easy-to-reach places. If you need to reach something above you, use a strong step stool that has a grab bar. Keep electrical cords out of the way. Do not use floor polish or wax that makes floors slippery. If you must use wax, use non-skid floor wax. Do not have throw rugs and other things on the floor that can make you trip. What can I do with  my stairs? Do not leave any items on the stairs. Make sure that there are handrails on both sides of the stairs and use them. Fix handrails that are broken or loose. Make sure that handrails are as long as the stairways. Check any carpeting to make sure that it is firmly attached to the stairs. Fix any carpet that is loose or worn. Avoid having throw rugs at the top or bottom of the stairs. If you do have throw rugs, attach them to the floor with carpet tape. Make sure that you have a light switch at the top of the stairs and the bottom of the stairs. If you do not have them, ask someone to add them for you. What else can I do to help prevent falls? Wear shoes that: Do not have high heels. Have rubber bottoms. Are comfortable and fit you well. Are closed at the toe. Do not wear sandals. If you use a stepladder: Make sure that it is fully opened. Do not climb a closed stepladder. Make sure that  both sides of the stepladder are locked into place. Ask someone to hold it for you, if possible. Clearly mark and make sure that you can see: Any grab bars or handrails. First and last steps. Where the edge of each step is. Use tools that help you move around (mobility aids) if they are needed. These include: Canes. Walkers. Scooters. Crutches. Turn on the lights when you go into a dark area. Replace any light bulbs as soon as they burn out. Set up your furniture so you have a clear path. Avoid moving your furniture around. If any of your floors are uneven, fix them. If there are any pets around you, be aware of where they are. Review your medicines with your doctor. Some medicines can make you feel dizzy. This can increase your chance of falling. Ask your doctor what other things that you can do to help prevent falls. This information is not intended to replace advice given to you by your health care provider. Make sure you discuss any questions you have with your health care provider. Document Released: 08/19/2009 Document Revised: 03/30/2016 Document Reviewed: 11/27/2014 Elsevier Interactive Patient Education  2017 [employer].

## 2023-02-26 NOTE — Progress Notes (Signed)
I connected with  Lisa Oconnell on 02/26/23 by a audio enabled telemedicine application and verified that I am speaking with the correct person using two identifiers.  Patient Location: Home  Provider Location: Office/Clinic  I discussed the limitations of evaluation and management by telemedicine. The patient expressed understanding and agreed to proceed.  Subjective:   Lisa Oconnell is a 82 y.o. female who presents for Medicare Annual (Subsequent) preventive examination.  Review of Systems     Cardiac Risk Factors include: advanced age (>70men, >22 women);dyslipidemia     Objective:    Today's Vitals   02/26/23 1026 02/26/23 1027  Weight: 103 lb (46.7 kg)   Height: 5\' 2"  (1.575 m)   PainSc:  5    Body mass index is 18.84 kg/m.     02/26/2023   10:31 AM 02/17/2021    5:46 AM 02/16/2021   10:16 AM 04/13/2017   10:56 AM 03/28/2017    6:46 PM 03/23/2017    8:43 AM 04/10/2016   11:29 AM  Advanced Directives  Does Patient Have a Medical Advance Directive? Yes No No No Yes Yes Yes  Type of Estate agent of [place];Living will    Healthcare Power of [place];Living will Healthcare Power of [place];Living will Healthcare Power of [place];Living will  Does patient want to make changes to medical advance directive?     No - Patient declined  No - Patient declined  Copy of Healthcare Power of Attorney in Chart? No - copy requested    No - copy requested No - copy requested No - copy requested  Would patient like information on creating a medical advance directive?   No - Patient declined        Current Medications (verified) Outpatient Encounter Medications as of 02/26/2023  Medication Sig   acetaminophen (TYLENOL) 500 MG tablet Take 1,000 mg by mouth at bedtime.   ALPRAZolam (XANAX) 0.5 MG tablet Take 0.5 mg by mouth at bedtime as needed for anxiety or sleep.   FLUoxetine (PROZAC) 20 MG capsule Take 1 capsule (20 mg total) by mouth daily. Take together with  40 mg capsule (total daily dose of 60 mg daily).   FLUoxetine (PROZAC) 40 MG capsule Take 1 capsule (40 mg total) by mouth daily. Take together with 20 mg tablet (total daily dose of 60 mg a day)   No facility-administered encounter medications on file as of 02/26/2023.    Allergies (verified) Sulfa antibiotics and Ambien [zolpidem]   History: Past Medical History:  Diagnosis Date   Allergy    year round   Anxiety    Arthritis    per pt/ not aware of it.   Cataract    bilateral cataract extraction. not sure if lens were implanted.   Colon polyp 01/2006   adenomatous   Depression    Hyperlipidemia    Tuberculosis 1966   treated   Past Surgical History:  Procedure Laterality Date   BREAST EXCISIONAL BIOPSY Right    benign/ 82 years old   BUNIONECTOMY     left   CATARACT EXTRACTION W/ INTRAOCULAR LENS  IMPLANT, BILATERAL  2013   cool sculpting     both arms and back   DILATION AND CURETTAGE OF UTERUS     EYE SURGERY     HAND CONTRACTURE RELEASE     trigger finger   KNEE ARTHROSCOPY WITH MEDIAL MENISECTOMY Right 04/11/2016   Procedure: KNEE ARTHROSCOPY WITH MEDIAL MENISECTOMY AND CONDROPLASTY;  Surgeon: Ollen Gross,  MD;  Location: [facility];  Service: Orthopedics;  Laterality: Right;   liposuction (15 yrs ago     stomach   LUMBAR LAMINECTOMY/DECOMPRESSION MICRODISCECTOMY N/A 02/17/2021   Procedure: Microlumbar Decompression Lumbar one-two, Lumbar two-three, Lumbar three-four;  Surgeon: Jene Every, MD;  Location: [facility];  Service: Orthopedics;  Laterality: N/A;   PARTIAL KNEE ARTHROPLASTY Right 03/28/2017   Procedure: UNICOMPARTMENTAL RIGHT  KNEE;  Surgeon: Ollen Gross, MD;  Location: [facility];  Service: Orthopedics;  Laterality: Right;  Adductor Block   TUBAL LIGATION  1978   Family History  Problem Relation Age of Onset   Alzheimer's disease Mother    Heart disease Father    Heart attack Father    Diabetes Sister    Diabetes Brother    Cancer Daughter         Non-Hodgkins Lymphoma   Diabetes Paternal Aunt    Cancer Grandson        Leukemia   Diabetes Grandson        Type 1   Epilepsy Grandson    Breast cancer Neg Hx    Social History   Socioeconomic History   Marital status: Married    Spouse name: Not on file   Number of children: 2   Years of education: Not on file   Highest education level: Not on file  Occupational History   Occupation: Retired  Tobacco Use   Smoking status: Never   Smokeless tobacco: Never  Vaping Use   Vaping Use: Never used  Substance and Sexual Activity   Alcohol use: Yes    Alcohol/week: 10.0 standard drinks of alcohol    Types: 10 Glasses of wine per week    Comment:  glasses of wine weekly    Drug use: No   Sexual activity: Yes  Other Topics Concern   Not on file  Social History Narrative   Not on file   Social Determinants of Health   Financial Resource Strain: Low Risk  (02/26/2023)   Overall Financial Resource Strain (CARDIA)    Difficulty of Paying Living Expenses: Not hard at all  Food Insecurity: No Food Insecurity (02/26/2023)   Hunger Vital Sign    Worried About Running Out of Food in the Last Year: Never true    Ran Out of Food in the Last Year: Never true  Transportation Needs: No Transportation Needs (02/26/2023)   PRAPARE - Administrator, Civil Service (Medical): No    Lack of Transportation (Non-Medical): No  Physical Activity: Inactive (02/26/2023)   Exercise Vital Sign    Days of Exercise per Week: 0 days    Minutes of Exercise per Session: 0 min  Stress: Stress Concern Present (02/26/2023)   [employer] of Occupational Health - Occupational Stress Questionnaire    Feeling of Stress : To some extent  Social Connections: Not on file    Tobacco Counseling Counseling given: Not Answered   Clinical Intake:  Pre-visit preparation completed: Yes  Pain : 0-10 Pain Score: 5  Pain Type: Chronic pain Pain Location: Back Pain Orientation: Lower Pain  Descriptors / Indicators: Aching Pain Onset: More than a month ago Pain Frequency: Constant     Nutritional Status: BMI <19  Underweight Nutritional Risks: Nausea/ vomitting/ diarrhea (always has diarrhea) Diabetes: No  How often do you need to have someone help you when you read instructions, pamphlets, or other written materials from your doctor or pharmacy?: 1 - Never  Diabetic? no  Interpreter Needed?: No  Information entered by :: NAllen LPN   Activities of Daily Living    02/26/2023   10:32 AM 05/26/2022    9:55 AM  In your present state of health, do you have any difficulty performing the following activities:  Hearing? 0 0  Vision? 0 0  Difficulty concentrating or making decisions? 0 1  Walking or climbing stairs? 0 1  Dressing or bathing? 0 0  Doing errands, shopping? 0 0  Preparing Food and eating ? N   Using the Toilet? N   In the past six months, have you accidently leaked urine? N   Do you have problems with loss of bowel control? N   Managing your Medications? N   Managing your Finances? N   Housekeeping or managing your Housekeeping? N     Patient Care Team: Loyola Mast, MD as PCP - General (Family Medicine) Sheran Luz, MD as Consulting Physician (Physical Medicine and Rehabilitation)  Indicate any recent Medical Services you may have received from other than [facility] providers in the past year (date may be approximate).     Assessment:   This is a routine wellness examination for Mackenna.  Hearing/Vision screen Vision Screening - Comments:: No regular eye exams  Dietary issues and exercise activities discussed: Current Exercise Habits: The patient does not participate in regular exercise at present   Goals Addressed             This Visit's Progress    Patient Stated       02/26/2023, wants to gain a little bit of weight       Depression Screen    02/26/2023   10:32 AM 01/02/2023    9:56 AM 09/04/2022   10:58 AM 06/22/2022     8:41 AM 05/26/2022    9:54 AM 04/10/2022    9:50 AM 02/27/2022   10:50 AM  PHQ 2/9 Scores  PHQ - 2 Score 0 PHQ- 9 Score  Fall Risk    02/26/2023   10:32 AM 11/21/2022   10:53 AM 06/22/2022    8:40 AM 05/26/2022   10:04 AM 04/10/2022    9:23 AM  Fall Risk   Falls in the past year? 0 1 0 0 0  Number falls in past yr: 0 1 0 0 0  Injury with Fall? 0 1 0 0 0  Risk for fall due to : Medication side effect   No Fall Risks   Follow up Falls prevention discussed;Education provided;Falls evaluation completed Falls evaluation completed  Falls evaluation completed     FALL RISK PREVENTION PERTAINING TO THE HOME:  Any stairs in or around the home? Yes  If so, are there any without handrails? No  Home free of loose throw rugs in walkways, pet beds, electrical cords, etc? Yes  Adequate lighting in your home to reduce risk of falls? Yes   ASSISTIVE DEVICES UTILIZED TO PREVENT FALLS:  Life alert? No  Use of a cane, walker or w/c? No  Grab bars in the bathroom? No  Shower chair or bench in shower? Yes  Elevated toilet seat or a handicapped toilet? Yes   TIMED UP AND GO:  Was the test performed? No .       Cognitive Function:        02/26/2023   10:34 AM 05/26/2022   10:00 AM  6CIT Screen  What Year? 0 points 0 points  What month? 0 points 0 points  What time? 3 points 0 points  Count back from 20 0 points 0 points  Months in reverse 2 points 0 points  Repeat phrase 0 points 0 points  Total Score 5 points 0 points    Immunizations Immunization History  Administered Date(s) Administered   PFIZER(Purple Top)SARS-COV-2 Vaccination 12/13/2019, 01/07/2020, 08/17/2020   Pfizer Covid-19 Vaccine Bivalent Booster 33yrs & up 08/24/2021   Pneumococcal Conjugate-13 04/27/2014   Pneumococcal Polysaccharide-23 12/02/2007, 03/29/2010   Td 03/02/2009, 03/29/2010   Tdap 05/04/2016   Zoster Recombinat (Shingrix) 03/02/2009, 03/29/2010   Zoster, Live 11/06/2005     TDAP status: Up to date  Flu Vaccine status: Up to date  Pneumococcal vaccine status: Up to date  Covid-19 vaccine status: Completed vaccines  Qualifies for Shingles Vaccine? Yes   Zostavax completed Yes   Shingrix Completed?: Yes  Screening Tests Health Maintenance  Topic Date Due   COVID-19 Vaccine (5 - 2023-24 season) 07/07/2022   INFLUENZA VACCINE  06/07/2023   Medicare Annual Wellness (AWV)  06/23/2023   DTaP/Tdap/Td (4 - Td or Tdap) 05/04/2026   Pneumonia Vaccine 57+ Years old  Completed   DEXA SCAN  Completed   Zoster Vaccines- Shingrix  Completed   HPV VACCINES  Aged Out    Health Maintenance  Health Maintenance Due  Topic Date Due   COVID-19 Vaccine (5 - 2023-24 season) 07/07/2022    Colorectal cancer screening: No longer required.   Mammogram status: No longer required due to age.  Bone Density status: Completed 08/15/2022.  Lung Cancer Screening: (Low Dose CT Chest recommended if Age 75-80 years, 30 pack-year currently smoking OR have quit w/in 15years.) does not qualify.   Lung Cancer Screening Referral: no  Additional Screening:  Hepatitis C Screening: does not qualify;   Vision Screening: Recommended annual ophthalmology exams for early detection of glaucoma and other disorders of the eye. Is the patient up to date with their annual eye exam?  No  Who is the provider or what is the name of the office in which the patient attends annual eye exams? none If pt is not established with a provider, would they like to be referred to a provider to establish care? No .   Dental Screening: Recommended annual dental exams for proper oral hygiene  Community Resource Referral / Chronic Care Management: CRR required this visit?  No   CCM required this visit?  No      Plan:     I have personally reviewed and noted the following in the patient's chart:   Medical and social history Use of alcohol, tobacco or illicit drugs  Current medications and  supplements including opioid prescriptions. Patient is not currently taking opioid prescriptions. Functional ability and status Nutritional status Physical activity Advanced directives List of other physicians Hospitalizations, surgeries, and ER visits in previous 12 months Vitals Screenings to include cognitive, depression, and falls Referrals and appointments  In addition, I have reviewed and discussed with patient certain preventive protocols, quality metrics, and best practice recommendations. A written personalized care plan for preventive services as well as general preventive health recommendations were provided to patient.     Barb Merino, LPN   1/[id number]/[id number]   Nurse Notes: none  Due to this being a virtual visit, the after visit summary with patients personalized plan was offered to patient via mail or my-chart.  to pick up at office at next visit

## 2023-03-02 ENCOUNTER — Encounter: Payer: Self-pay | Admitting: Family Medicine

## 2023-03-02 ENCOUNTER — Ambulatory Visit (INDEPENDENT_AMBULATORY_CARE_PROVIDER_SITE_OTHER): Payer: Medicare HMO | Admitting: Family Medicine

## 2023-03-02 VITALS — BP 118/70 | HR 58 | Temp 97.6°F | Wt 104.0 lb

## 2023-03-02 DIAGNOSIS — F411 Generalized anxiety disorder: Secondary | ICD-10-CM | POA: Diagnosis not present

## 2023-03-02 DIAGNOSIS — F339 Major depressive disorder, recurrent, unspecified: Secondary | ICD-10-CM

## 2023-03-02 NOTE — Assessment & Plan Note (Signed)
Anxiety symptoms are improved. Continue judicious use of alprazolam.

## 2023-03-02 NOTE — Assessment & Plan Note (Signed)
Depressive symptoms are improved, with her major stressor resolving. I will have her continue fluoxetine 60 mg daily. We will readdress this in 6 months.

## 2023-03-02 NOTE — Progress Notes (Signed)
[facility] [address] [place] [address] Dept: [phone] Dept Fax: [phone]  Office Visit  Subjective:    Patient ID: Lisa Oconnell, female    DOB: 1941/07/04, 82 y.o..   MRN: [id number]  Chief Complaint  Patient presents with   Medical Management of Chronic Issues    2 month f/u.     History of Present Illness:  Ms. Gunawan has a history of depression and anxiety. She notes that she is doing better at this point. Her husband is finally no longer staying in her house. They have completed mediation related to their divorce. Her niece has followed through with helping her with financial concerns related to her home. Ms. Grabinski has also maintained ownership of their dog, though her ex-husband still has visitation rights. She continues to need to address some of the financial transitions, but is getting to these. SM. Cronk notes others have commented about her weight loss. However, she feels she is eating and thinks this will stabilize. She does contineu to use some Xanax to help with sleep at times. She remain on fluoxetine 60 mg daily and feels this has helped.  Past Medical History: Patient Active Problem List   Diagnosis Date Noted   Tinnitus aurium, bilateral 11/21/2022   Depression, recurrent (HCC) 02/27/2022   Generalized anxiety disorder 02/27/2022   Insomnia 02/27/2022   History of tuberculosis 02/27/2022   History of colon polyps 02/27/2022   Hyperlipidemia 02/27/2022   Osteopenia 02/27/2022   Lichen simplex chronicus 02/27/2022   Sebopsoriasis 02/26/2022   Sacroiliac joint pain 02/10/2022   Lumbar spondylosis 01/16/2022   HNP (herniated nucleus pulposus), lumbar 02/17/2021   Neurogenic claudication 01/25/2021   Degenerative lumbar spinal stenosis 01/25/2021   History of prosthetic unicompartmental arthroplasty of right knee 04/01/2020   OA (osteoarthritis) of knee 03/28/2017   Acute medial meniscal tear  04/11/2016   Past Surgical History:  Procedure Laterality Date   BREAST EXCISIONAL BIOPSY Right    benign/ 82 years old   BUNIONECTOMY     left   CATARACT EXTRACTION W/ INTRAOCULAR LENS  IMPLANT, BILATERAL  2013   cool sculpting     both arms and back   DILATION AND CURETTAGE OF UTERUS     EYE SURGERY     HAND CONTRACTURE RELEASE     trigger finger   KNEE ARTHROSCOPY WITH MEDIAL MENISECTOMY Right 04/11/2016   Procedure: KNEE ARTHROSCOPY WITH MEDIAL MENISECTOMY AND CONDROPLASTY;  Surgeon: Ollen Gross, MD;  Location: [facility];  Service: Orthopedics;  Laterality: Right;   liposuction (15 yrs ago     stomach   LUMBAR LAMINECTOMY/DECOMPRESSION MICRODISCECTOMY N/A 02/17/2021   Procedure: Microlumbar Decompression Lumbar one-two, Lumbar two-three, Lumbar three-four;  Surgeon: Jene Every, MD;  Location: [facility];  Service: Orthopedics;  Laterality: N/A;   PARTIAL KNEE ARTHROPLASTY Right 03/28/2017   Procedure: UNICOMPARTMENTAL RIGHT  KNEE;  Surgeon: Ollen Gross, MD;  Location: [facility];  Service: Orthopedics;  Laterality: Right;  Adductor Block   TUBAL LIGATION  1978   Family History  Problem Relation Age of Onset   Alzheimer's disease Mother    Heart disease Father    Heart attack Father    Diabetes Sister    Diabetes Brother    Cancer Daughter        Non-Hodgkins Lymphoma   Diabetes Paternal Aunt    Cancer Grandson        Leukemia   Diabetes Grandson  Type 1   Epilepsy Grandson    Breast cancer Neg Hx    Outpatient Medications Prior to Visit  Medication Sig Dispense Refill   acetaminophen (TYLENOL) 500 MG tablet Take 1,000 mg by mouth at bedtime.     ALPRAZolam (XANAX) 0.5 MG tablet Take 0.5 mg by mouth at bedtime as needed for anxiety or sleep.     FLUoxetine (PROZAC) 20 MG capsule Take 1 capsule (20 mg total) by mouth daily. Take together with 40 mg capsule (total daily dose of 60 mg daily). 90 capsule 3   FLUoxetine (PROZAC) 40 MG capsule Take 1 capsule (40 mg total)  by mouth daily. Take together with 20 mg tablet (total daily dose of 60 mg a day) 90 capsule 3   No facility-administered medications prior to visit.   Allergies  Allergen Reactions   Sulfa Antibiotics Swelling    Mouth blistered,    Ambien [Zolpidem] Other (See Comments)     Objective:   Today's Vitals   03/02/23 0958  BP: 118/70  Pulse: (!) 58  Temp: 97.6 F (36.4 C)  TempSrc: Temporal  SpO2: 100%  Weight: 104 lb (47.2 kg)   Body mass index is 19.02 kg/m.   General: Well developed, well nourished. No acute distress. Psych: Alert and oriented. Normal mood and affect.  There are no preventive care reminders to display for this patient.    Assessment & Plan:   Problem List Items Addressed This Visit       Other   Depression, recurrent (HCC)    Depressive symptoms are improved, with her major stressor resolving. I will have her continue fluoxetine 60 mg daily. We will readdress this in 6 months.      Generalized anxiety disorder - Primary    Anxiety symptoms are improved. Continue judicious use of alprazolam.       Return in about 2 months (around 05/02/2023) for Reassessment.   Loyola Mast, MD

## 2023-05-02 ENCOUNTER — Ambulatory Visit: Payer: Medicare HMO | Admitting: Family Medicine

## 2023-05-02 ENCOUNTER — Telehealth: Payer: Self-pay | Admitting: Family Medicine

## 2023-05-02 ENCOUNTER — Ambulatory Visit (INDEPENDENT_AMBULATORY_CARE_PROVIDER_SITE_OTHER): Payer: Medicare HMO | Admitting: Family Medicine

## 2023-05-02 ENCOUNTER — Encounter: Payer: Self-pay | Admitting: Family Medicine

## 2023-05-02 VITALS — BP 114/66 | HR 67 | Temp 98.4°F | Ht 62.0 in | Wt 103.4 lb

## 2023-05-02 DIAGNOSIS — D171 Benign lipomatous neoplasm of skin and subcutaneous tissue of trunk: Secondary | ICD-10-CM | POA: Insufficient documentation

## 2023-05-02 DIAGNOSIS — R440 Auditory hallucinations: Secondary | ICD-10-CM | POA: Diagnosis not present

## 2023-05-02 DIAGNOSIS — F339 Major depressive disorder, recurrent, unspecified: Secondary | ICD-10-CM | POA: Diagnosis not present

## 2023-05-02 DIAGNOSIS — F411 Generalized anxiety disorder: Secondary | ICD-10-CM | POA: Diagnosis not present

## 2023-05-02 DIAGNOSIS — H9313 Tinnitus, bilateral: Secondary | ICD-10-CM

## 2023-05-02 DIAGNOSIS — D225 Melanocytic nevi of trunk: Secondary | ICD-10-CM | POA: Insufficient documentation

## 2023-05-02 DIAGNOSIS — G3184 Mild cognitive impairment, so stated: Secondary | ICD-10-CM

## 2023-05-02 DIAGNOSIS — Z86018 Personal history of other benign neoplasm: Secondary | ICD-10-CM | POA: Insufficient documentation

## 2023-05-02 DIAGNOSIS — R413 Other amnesia: Secondary | ICD-10-CM | POA: Insufficient documentation

## 2023-05-02 DIAGNOSIS — L821 Other seborrheic keratosis: Secondary | ICD-10-CM | POA: Insufficient documentation

## 2023-05-02 NOTE — Telephone Encounter (Signed)
Pt called in thinking her appt was at 11:00 today 05/02/23, it was not. She missed her 9:20, I rescheduled this appt for 2:20 for today 05/02/23.

## 2023-05-02 NOTE — Assessment & Plan Note (Signed)
Lisa Oconnell complaints and MMSE are consistent with mild cognitive impairment. I reassured her that she is not showing signs of Alzheimer's disease. We discussed how she might share this with her daughter. I will plan to repeat her MMSE in 6 months.

## 2023-05-02 NOTE — Assessment & Plan Note (Signed)
Depressive symptoms are improved, with her major stressor resolving. I will have her continue fluoxetine 60 mg daily.

## 2023-05-02 NOTE — Telephone Encounter (Signed)
1st no show, fee waived, letter and text sent  

## 2023-05-02 NOTE — Assessment & Plan Note (Signed)
Stable

## 2023-05-02 NOTE — Assessment & Plan Note (Signed)
Anxiety symptoms are improved. Continue judicious use of alprazolam. 

## 2023-05-02 NOTE — Telephone Encounter (Signed)
6.26.24 no show letter sent

## 2023-05-02 NOTE — Assessment & Plan Note (Signed)
This is likely associated with Ms. Wooden's tinnitus and her anxiety. I do not think this represents either a psychotic disorder or sign of dementia. As this is not distressing,w e will monitor this for now.

## 2023-05-02 NOTE — Progress Notes (Signed)
[facility] [address] [place] [address] Dept: [phone] Dept Fax: [phone]  Chronic Care Office Visit  Subjective:    Patient ID: Lisa Oconnell, female    DOB: 03/09/1941, 82 y.o..   MRN: [id number]  No chief complaint on file.  History of Present Illness:  Patient is in today for reassessment of chronic medical issues.  Lisa Oconnell has a history of depression and anxiety. Over the past year, much of this has been triggered by marital stress that has eventually ended in separation. Although her husband, Lisa Oconnell, has moved out earlier in the Spring, they have joint ownership of a dog Actor) that is in Lisa Oconnell's custody.  Lisa Oconnell, was recently over for a visit with the dog. Lisa Oconnell exhibited an ability to hold firm as he tried to test boundaries related to their relationship. She feels good about her stance with him. She notes she is eating better and her weight has increased from its nadir.  Lisa Oconnell has a history of tinnitus. More recently, she has had issues with hearing voices in the house, as if a TV is on, but there is no such TV on when she goes to check. This occurs intermittently. She has not found this distressing. However, she raises concerns about possible dementia, as her mother exhibited auditory hallucinations as part of her Alzheimer's disease. Lisa Oconnell notes he daughter has expressed concerns for her mother having dementia. Lisa Oconnell admits that she has some forgetfulness, such as forgetting or confusing the timing of appointments.  Past Medical History: Patient Active Problem List   Diagnosis Date Noted   Seborrheic keratoses 05/02/2023   Melanocytic nevi of trunk 05/02/2023   Lipoma of back 05/02/2023   History of dysplastic nevus 05/02/2023   Mild cognitive impairment 05/02/2023   Tinnitus aurium, bilateral 11/21/2022   Depression, recurrent (HCC) 02/27/2022   Generalized anxiety disorder 02/27/2022    Insomnia 02/27/2022   History of tuberculosis 02/27/2022   History of colon polyps 02/27/2022   Hyperlipidemia 02/27/2022   Osteopenia 02/27/2022   Lichen simplex chronicus 02/27/2022   Sebopsoriasis 02/26/2022   Sacroiliac joint pain 02/10/2022   Lumbar spondylosis 01/16/2022   HNP (herniated nucleus pulposus), lumbar 02/17/2021   Neurogenic claudication 01/25/2021   Degenerative lumbar spinal stenosis 01/25/2021   History of prosthetic unicompartmental arthroplasty of right knee 04/01/2020   OA (osteoarthritis) of knee 03/28/2017   Acute medial meniscal tear 04/11/2016   Past Surgical History:  Procedure Laterality Date   BREAST EXCISIONAL BIOPSY Right    benign/ 82 years old   BUNIONECTOMY     left   CATARACT EXTRACTION W/ INTRAOCULAR LENS  IMPLANT, BILATERAL  2013   cool sculpting     both arms and back   DILATION AND CURETTAGE OF UTERUS     EYE SURGERY     HAND CONTRACTURE RELEASE     trigger finger   KNEE ARTHROSCOPY WITH MEDIAL MENISECTOMY Right 04/11/2016   Procedure: KNEE ARTHROSCOPY WITH MEDIAL MENISECTOMY AND CONDROPLASTY;  Surgeon: Lisa Gross, MD;  Location: [facility];  Service: Orthopedics;  Laterality: Right;   liposuction (15 yrs ago     stomach   LUMBAR LAMINECTOMY/DECOMPRESSION MICRODISCECTOMY N/A 02/17/2021   Procedure: Microlumbar Decompression Lumbar one-two, Lumbar two-three, Lumbar three-four;  Surgeon: Lisa Every, MD;  [facility]: [facility];  Service: Orthopedics;  Laterality: N/A;   PARTIAL KNEE ARTHROPLASTY Right 03/28/2017   Procedure: UNICOMPARTMENTAL RIGHT  KNEE;  Surgeon: Lisa Gross, MD;  Location: [facility];  Service: Orthopedics;  Laterality: Right;  Adductor Block   TUBAL LIGATION  1978   Family History  Problem Relation Age of Onset   Alzheimer's disease Mother    Heart disease Father    Heart attack Father    Diabetes Sister    Diabetes Brother    Cancer Daughter        Non-Hodgkins Lymphoma   Diabetes Paternal Aunt    Cancer Grandson         Leukemia   Diabetes Grandson        Type 1   Epilepsy Grandson    Breast cancer Neg Hx    Outpatient Medications Prior to Visit  Medication Sig Dispense Refill   acetaminophen (TYLENOL) 500 MG tablet Take 1,000 mg by mouth at bedtime.     ALPRAZolam (XANAX) 0.5 MG tablet Take 0.5 mg by mouth at bedtime as needed for anxiety or sleep.     FLUoxetine (PROZAC) 20 MG capsule Take 1 capsule (20 mg total) by mouth daily. Take together with 40 mg capsule (total daily dose of 60 mg daily). 90 capsule 3   FLUoxetine (PROZAC) 40 MG capsule Take 1 capsule (40 mg total) by mouth daily. Take together with 20 mg tablet (total daily dose of 60 mg a day) 90 capsule 3   No facility-administered medications prior to visit.   Allergies  Allergen Reactions   Sulfa Antibiotics Swelling    Mouth blistered,    Ambien [Zolpidem] Other (See Comments)   Objective:   Today's Vitals   05/02/23 1416  BP: 114/66  Pulse: 67  Temp: 98.4 F (36.9 C)  TempSrc: Temporal  SpO2: 98%  Weight: 103 lb 6.4 oz (46.9 kg)  Height: 5\' 2"  (1.575 m)   Body mass index is 18.91 kg/m.   General: Well developed, well nourished. No acute distress. Psych: Alert and oriented. Normal mood and affect.  There are no preventive care reminders to display for this patient.     05/02/2023    3:00 PM 02/26/2023   10:32 AM 01/02/2023    9:56 AM  Depression screen PHQ 2/9  Decreased Interest 0 0 0  Down, Depressed, Hopeless 2 0 3  PHQ - 2 Score 2 0 3  Altered sleeping 1  3  Tired, decreased energy 0  0  Change in appetite 1  0  Feeling bad or failure about yourself  2  1  Trouble concentrating 2  0  Moving slowly or fidgety/restless 0  0  Suicidal thoughts 2  2  PHQ-9 Score 10  9  Difficult doing work/chores Somewhat difficult  Very difficult      05/02/2023    4:31 PM  MMSE - Mini Mental State Exam  Orientation to time 3  Orientation to Place 5  Registration 3  Attention/ Calculation 4  Recall 2  Language-  name 2 objects 2  Language- repeat 1  Language- follow 3 step command 3  Language- read & follow direction 1  Write a sentence 1  Copy design 1  Total score 26     Assessment & Plan:   Problem List Items Addressed This Visit       Other   Depression, recurrent (HCC)    Depressive symptoms are improved, with her major stressor resolving. I will have her continue fluoxetine 60 mg daily.       Generalized anxiety disorder - Primary    Anxiety symptoms are improved. Continue judicious use of  alprazolam.      Tinnitus aurium, bilateral    Stable.      Mild cognitive impairment    Ms. Propps's complaints and MMSE are consistent with mild cognitive impairment. I reassured her that she is not showing signs of Alzheimer's disease. We discussed how she might share this with her daughter. I will plan to repeat her MMSE in 6 months.      Auditory hallucinations    This is likely associated with Ms. Osei's tinnitus and her anxiety. I do not think this represents either a psychotic disorder or sign of dementia. As this is not distressing,w e will monitor this for now.       Return in about 3 months (around 08/02/2023) for Reassessment.   Loyola Mast, MD

## 2023-05-22 ENCOUNTER — Telehealth: Payer: Self-pay | Admitting: Family Medicine

## 2023-05-22 NOTE — Telephone Encounter (Signed)
Caller Name: Ourania Hamler Ph #: [phone] Chief Complaint: Pt stated she was feeling dizzy and having trouble breathing.   This call was transferred to Nurse Triage/Access Nurse. This is for documentation purposes. No follow up required at this time.

## 2023-05-22 NOTE — Telephone Encounter (Signed)
Pt triaged to be seen in clinic within 3-4 hours. Pr reports having SOB at rest and with activity x 1 month that seems to gradually be getting worse over the coarse of the past week. Pt also reports changes to her voice, ringing in her ears, runny nose post root canal at the dentist. No chest pain, chest tightness, sweating, or other symptoms per patient.  There are no appointments available at this time. Pt advised to go to the ED or UC if symptoms persist or worsen. Pt schedule to see Dr. Veto Kemps tomorrow, Wednesday, 05/23/23.

## 2023-05-23 ENCOUNTER — Encounter: Payer: Self-pay | Admitting: Family Medicine

## 2023-05-23 ENCOUNTER — Ambulatory Visit: Payer: Medicare HMO | Admitting: Family Medicine

## 2023-05-23 VITALS — BP 116/72 | HR 61 | Temp 98.1°F | Ht 62.0 in | Wt 104.2 lb

## 2023-05-23 DIAGNOSIS — F411 Generalized anxiety disorder: Secondary | ICD-10-CM

## 2023-05-23 DIAGNOSIS — F339 Major depressive disorder, recurrent, unspecified: Secondary | ICD-10-CM

## 2023-05-23 NOTE — Progress Notes (Signed)
[facility] [address] [place] [address] Dept: [phone] Dept Fax: [phone]  Office Visit  Subjective:    Patient ID: Lisa Oconnell, female    DOB: [birth year], 82 y.o..   MRN: [id number]  Chief Complaint  Patient presents with   Shortness of Breath    For 1 week worse when walking    Ear ringing     "Ringing in both ears for a while"   Vision problems     "Double vision" for couple weeks    History of Present Illness:  Patient is in today complaining of multiple symptoms. She notes she has had a general sensation of shortness of breath for the past week. She has not had a cough. She is not having any swelling in her extremities. As well, she has noted episodes of double vision over the past 2 weeks. She admits to runny nose and a loss of a sense of taste. She also has had some ringing in her ears.  Lisa Oconnell has a history of depression and anxiety. Over the past year, much of this has been triggered by marital stress that has eventually ended in separation. She also has had a strained relationship with her daughter. She has some friends for support, but is spending much of her time alone. She admits to some passive suicidality, but has taken no actions on this and has no guns in the house. She is currently on fluoxetine 60 mg daily and uses alprazolam 0.5 mg 1 1/2 tab at bedtime.  Past Medical History: Patient Active Problem List   Diagnosis Date Noted   Seborrheic keratoses 05/02/2023   Melanocytic nevi of trunk 05/02/2023   Lipoma of back 05/02/2023   History of dysplastic nevus 05/02/2023   Mild cognitive impairment 05/02/2023   Auditory hallucinations 05/02/2023   Tinnitus aurium, bilateral 11/21/2022   Depression, recurrent (HCC) 02/27/2022   Generalized anxiety disorder 02/27/2022   Insomnia 02/27/2022   History of tuberculosis 02/27/2022   History of colon polyps 02/27/2022   Hyperlipidemia 02/27/2022    Osteopenia 02/27/2022   Lichen simplex chronicus 02/27/2022   Sebopsoriasis 02/26/2022   Sacroiliac joint pain 02/10/2022   Lumbar spondylosis 01/16/2022   HNP (herniated nucleus pulposus), lumbar 02/17/2021   Neurogenic claudication 01/25/2021   Degenerative lumbar spinal stenosis 01/25/2021   History of prosthetic unicompartmental arthroplasty of right knee 04/01/2020   OA (osteoarthritis) of knee 03/28/2017   Acute medial meniscal tear 04/11/2016   Past Surgical History:  Procedure Laterality Date   BREAST EXCISIONAL BIOPSY Right    benign/ 82 years old   BUNIONECTOMY     left   CATARACT EXTRACTION W/ INTRAOCULAR LENS  IMPLANT, BILATERAL  2013   cool sculpting     both arms and back   DILATION AND CURETTAGE OF UTERUS     EYE SURGERY     HAND CONTRACTURE RELEASE     trigger finger   KNEE ARTHROSCOPY WITH MEDIAL MENISECTOMY Right 04/11/2016   Procedure: KNEE ARTHROSCOPY WITH MEDIAL MENISECTOMY AND CONDROPLASTY;  Surgeon: Ollen Gross, MD;  Location: [facility];  Service: Orthopedics;  Laterality: Right;   liposuction (15 yrs ago     stomach   LUMBAR LAMINECTOMY/DECOMPRESSION MICRODISCECTOMY N/A 02/17/2021   Procedure: Microlumbar Decompression Lumbar one-two, Lumbar two-three, Lumbar three-four;  Surgeon: Jene Every, MD;  Location: [facility];  Service: Orthopedics;  Laterality: N/A;   PARTIAL KNEE ARTHROPLASTY Right 03/28/2017   Procedure: UNICOMPARTMENTAL RIGHT  KNEE;  Surgeon: Ollen Gross, MD;  [facility]: [facility];  Service: Orthopedics;  Laterality: Right;  Adductor Block   TUBAL LIGATION  1978   Family History  Problem Relation Age of Onset   Alzheimer's disease Mother    Heart disease Father    Heart attack Father    Diabetes Sister    Diabetes Brother    Cancer Daughter        Non-Hodgkins Lymphoma   Diabetes Paternal Aunt    Cancer Grandson        Leukemia   Diabetes Grandson        Type 1   Epilepsy Grandson    Breast cancer Neg Hx    Outpatient Medications  Prior to Visit  Medication Sig Dispense Refill   acetaminophen (TYLENOL) 500 MG tablet Take 1,000 mg by mouth at bedtime.     ALPRAZolam (XANAX) 0.5 MG tablet Take 0.5 mg by mouth at bedtime as needed for anxiety or sleep.     FLUoxetine (PROZAC) 20 MG capsule Take 1 capsule (20 mg total) by mouth daily. Take together with 40 mg capsule (total daily dose of 60 mg daily). 90 capsule 3   FLUoxetine (PROZAC) 40 MG capsule Take 1 capsule (40 mg total) by mouth daily. Take together with 20 mg tablet (total daily dose of 60 mg a day) 90 capsule 3   No facility-administered medications prior to visit.   Allergies  Allergen Reactions   Sulfa Antibiotics Swelling    Mouth blistered,    Ambien [Zolpidem] Other (See Comments)     Objective:   Today's Vitals   05/23/23 1101  BP: 116/72  Pulse: 61  Temp: 98.1 F (36.7 C)  TempSrc: Temporal  SpO2: 98%  Weight: 104 lb 4 oz (47.3 kg)  Height: 5\' 2"  (1.575 m)   Body mass index is 19.07 kg/m.   HEENT: Normocephalic, non-traumatic. PERRL, EOMI. Conjunctiva clear. External ears normal. EAC and TMs normal bilaterally. Nose clear without congestion or rhinorrhea. Mucous membranes   moist. Oropharynx clear. Good dentition. Neck: Supple. No lymphadenopathy. No thyromegaly. Lungs: Clear to auscultation bilaterally. No wheezing, rales or rhonchi. CV: RRR without murmurs or rubs. Pulses 2+ bilaterally.  There are no preventive care reminders to display for this patient.    Assessment & Plan:   Problem List Items Addressed This Visit       Other   Depression, recurrent (HCC)    Lisa Oconnell continues to exhibit signs of moderately severe depression. I recommend she start to engage in counseling and will refer her. I provided her with information regarding the [facility] should she need to get in for more urgent intervention. I also recommended she call a suicide hotline if she found her suicidality becoming more active.       Relevant Orders   Ambulatory referral to Psychology   Generalized anxiety disorder - Primary    I suspect that many of the symptoms she is currently experiencing are related to her anxiety. Lisa Oconnell has been receiving alprazolam form Dr. Clelia Croft.  I recommended she consider taking an additional 1/2 tab during the day when her anxiety is becoming overwhelming.      Relevant Orders   Ambulatory referral to Psychology    Return in about 2 weeks (around 06/06/2023) for Reassessment.   Loyola Mast, MD

## 2023-05-23 NOTE — Assessment & Plan Note (Addendum)
I suspect that many of the symptoms she is currently experiencing are related to her anxiety. Lisa Oconnell has been receiving alprazolam form Dr. Clelia Croft.  I recommended she consider taking an additional 1/2 tab during the day when her anxiety is becoming overwhelming.

## 2023-05-23 NOTE — Patient Instructions (Addendum)
[facility] [address], [place] [address]  [phone]  Hours: Open 24/7, No appointment required.

## 2023-05-23 NOTE — Assessment & Plan Note (Signed)
Lisa Oconnell continues to exhibit signs of moderately severe depression. I recommend she start to engage in counseling and will refer her. I provided her with information regarding the [facility] should she need to get in for more urgent intervention. I also recommended she call a suicide hotline if she found her suicidality becoming more active.

## 2023-06-06 ENCOUNTER — Ambulatory Visit (INDEPENDENT_AMBULATORY_CARE_PROVIDER_SITE_OTHER): Payer: Medicare HMO | Admitting: Family Medicine

## 2023-06-06 VITALS — BP 112/68 | HR 80 | Temp 98.4°F | Ht 62.0 in | Wt 103.6 lb

## 2023-06-06 DIAGNOSIS — F339 Major depressive disorder, recurrent, unspecified: Secondary | ICD-10-CM | POA: Diagnosis not present

## 2023-06-06 DIAGNOSIS — K047 Periapical abscess without sinus: Secondary | ICD-10-CM

## 2023-06-06 MED ORDER — AMOXICILLIN-POT CLAVULANATE 875-125 MG PO TABS: 1.0000 | ORAL_TABLET | Freq: Two times a day (BID) | ORAL | 0 refills | Status: DC

## 2023-06-06 NOTE — Assessment & Plan Note (Signed)
Lisa Oconnell continues to exhibit signs of moderately severe depression. She will be starting counseling soon. I recommend she continue fluoxetine 60 mg daily.

## 2023-06-06 NOTE — Progress Notes (Signed)
[facility] [address] [place] [address] Dept: [phone] Dept Fax: [phone]  Chronic Care Office Visit  Subjective:    Patient ID: Lisa Oconnell, female    DOB: 1941/08/31, 82 y.o..   MRN: [id number]  Chief Complaint  Patient presents with   Follow-up    F/u meds.  C/o not feeling well for a while and has a tooth that might be infected.    History of Present Illness:  Patient is in today for reassessment of chronic medical issues.  Ms. Demuth has a history of depression and anxiety. Over the past year, much of this has been triggered by marital stress that has eventually ended in separation. She also has had a strained relationship with her daughter. She has some friends for support, but is spending much of her time alone. She is currently on fluoxetine 60 mg daily and uses alprazolam 0.5 mg 1 1/2 tab at bedtime. In the past 2 weeks, she notes a stressor related to her ex-husband and is visitation of their pet dog, for which he has visitation rights. Ms. Clugston notes describes her ex-husband flip flopping dates for when he will visit and then blaming her for issues that this creates for his own schedule.I did refer her for counseling at her last visit and this is pending for 2 weeks.  Ms. Henly notes she has been having some dental work done. She has an upper left molar that has been tender. The dentist plans to pull this tooth. However, Ms. Wargel notes this remains very sensitive at present.  Past Medical History: Patient Active Problem List   Diagnosis Date Noted   Seborrheic keratoses 05/02/2023   Melanocytic nevi of trunk 05/02/2023   Lipoma of back 05/02/2023   History of dysplastic nevus 05/02/2023   Mild cognitive impairment 05/02/2023   Auditory hallucinations 05/02/2023   Tinnitus aurium, bilateral 11/21/2022   Depression, recurrent (HCC) 02/27/2022   Generalized anxiety disorder 02/27/2022   Insomnia  02/27/2022   History of tuberculosis 02/27/2022   History of colon polyps 02/27/2022   Hyperlipidemia 02/27/2022   Osteopenia 02/27/2022   Lichen simplex chronicus 02/27/2022   Sebopsoriasis 02/26/2022   Sacroiliac joint pain 02/10/2022   Lumbar spondylosis 01/16/2022   HNP (herniated nucleus pulposus), lumbar 02/17/2021   Neurogenic claudication 01/25/2021   Degenerative lumbar spinal stenosis 01/25/2021   History of prosthetic unicompartmental arthroplasty of right knee 04/01/2020   OA (osteoarthritis) of knee 03/28/2017   Acute medial meniscal tear 04/11/2016   Past Surgical History:  Procedure Laterality Date   BREAST EXCISIONAL BIOPSY Right    benign/ 82 years old   BUNIONECTOMY     left   CATARACT EXTRACTION W/ INTRAOCULAR LENS  IMPLANT, BILATERAL  2013   cool sculpting     both arms and back   DILATION AND CURETTAGE OF UTERUS     EYE SURGERY     HAND CONTRACTURE RELEASE     trigger finger   KNEE ARTHROSCOPY WITH MEDIAL MENISECTOMY Right 04/11/2016   Procedure: KNEE ARTHROSCOPY WITH MEDIAL MENISECTOMY AND CONDROPLASTY;  Surgeon: Ollen Gross, MD;  [facility]: [facility];  Service: Orthopedics;  Laterality: Right;   liposuction (15 yrs ago     stomach   LUMBAR LAMINECTOMY/DECOMPRESSION MICRODISCECTOMY N/A 02/17/2021   Procedure: Microlumbar Decompression Lumbar one-two, Lumbar two-three, Lumbar three-four;  Surgeon: Jene Every, MD;  Location: [facility];  Service: Orthopedics;  Laterality: N/A;   PARTIAL KNEE ARTHROPLASTY Right 03/28/2017  Procedure: UNICOMPARTMENTAL RIGHT  KNEE;  Surgeon: Ollen Gross, MD;  [facility];  Service: Orthopedics;  Laterality: Right;  Adductor Block   TUBAL LIGATION  1978   Family History  Problem Relation Age of Onset   Alzheimer's disease Mother    Heart disease Father    Heart attack Father    Diabetes Sister    Diabetes Brother    Cancer Daughter        Non-Hodgkins Lymphoma   Diabetes Paternal Aunt    Cancer Grandson         Leukemia   Diabetes Grandson        Type 1   Epilepsy Grandson    Breast cancer Neg Hx    Outpatient Medications Prior to Visit  Medication Sig Dispense Refill   acetaminophen (TYLENOL) 500 MG tablet Take 1,000 mg by mouth at bedtime.     ALPRAZolam (XANAX) 0.5 MG tablet Take 0.5 mg by mouth at bedtime as needed for anxiety or sleep.     FLUoxetine (PROZAC) 20 MG capsule Take 1 capsule (20 mg total) by mouth daily. Take together with 40 mg capsule (total daily dose of 60 mg daily). 90 capsule 3   FLUoxetine (PROZAC) 40 MG capsule Take 1 capsule (40 mg total) by mouth daily. Take together with 20 mg tablet (total daily dose of 60 mg a day) 90 capsule 3   No facility-administered medications prior to visit.   Allergies  Allergen Reactions   Sulfa Antibiotics Swelling    Mouth blistered,    Ambien [Zolpidem] Other (See Comments)   Objective:   Today's Vitals   06/06/23 1013  BP: 112/68  Pulse: 80  Temp: 98.4 F (36.9 C)  TempSrc: Temporal  SpO2: 100%  Weight: 103 lb 9.6 oz (47 kg)  Height: 5\' 2"  (1.575 m)   Body mass index is 18.95 kg/m.   General: Well developed, well nourished. No acute distress. HEENT: Normocephalic, non-traumatic. There are several molars missing on the upper left maxilla. The remain   molar is tender to percussion. Psych: Alert and oriented. Normal mood and affect.  There are no preventive care reminders to display for this patient.    Assessment & Plan:   Problem List Items Addressed This Visit       Other   Depression, recurrent (HCC) - Primary    Ms. Duttry continues to exhibit signs of moderately severe depression. She will be starting counseling soon. I recommend she continue fluoxetine 60 mg daily.      Other Visit Diagnoses     Dental infection       Appears to have a dental infeciton. I will treat her with a course of Augmentin. She shoudl follow-up with her dentist.   Relevant Medications   amoxicillin-clavulanate (AUGMENTIN)  875-125 MG tablet       Return in about 6 weeks (around 07/18/2023) for Reassessment.   Loyola Mast, MD

## 2023-06-19 ENCOUNTER — Ambulatory Visit (INDEPENDENT_AMBULATORY_CARE_PROVIDER_SITE_OTHER): Payer: Medicare HMO | Admitting: Psychology

## 2023-06-19 DIAGNOSIS — F339 Major depressive disorder, recurrent, unspecified: Secondary | ICD-10-CM | POA: Diagnosis not present

## 2023-06-19 DIAGNOSIS — F411 Generalized anxiety disorder: Secondary | ICD-10-CM | POA: Diagnosis not present
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
## 2023-07-07 DIAGNOSIS — N189 Chronic kidney disease, unspecified: Secondary | ICD-10-CM | POA: Diagnosis not present

## 2023-07-07 DIAGNOSIS — M1990 Unspecified osteoarthritis, unspecified site: Secondary | ICD-10-CM | POA: Diagnosis not present

## 2023-07-07 DIAGNOSIS — M8580 Other specified disorders of bone density and structure, unspecified site: Secondary | ICD-10-CM | POA: Diagnosis not present

## 2023-07-07 DIAGNOSIS — R32 Unspecified urinary incontinence: Secondary | ICD-10-CM | POA: Diagnosis not present

## 2023-07-07 DIAGNOSIS — M4800 Spinal stenosis, site unspecified: Secondary | ICD-10-CM | POA: Diagnosis not present

## 2023-07-07 DIAGNOSIS — F320 Major depressive disorder, single episode, mild: Secondary | ICD-10-CM | POA: Diagnosis not present

## 2023-07-07 DIAGNOSIS — F419 Anxiety disorder, unspecified: Secondary | ICD-10-CM | POA: Diagnosis not present

## 2023-07-07 DIAGNOSIS — Z882 Allergy status to sulfonamides status: Secondary | ICD-10-CM | POA: Diagnosis not present

## 2023-07-07 DIAGNOSIS — M545 Low back pain, unspecified: Secondary | ICD-10-CM | POA: Diagnosis not present

## 2023-07-07 DIAGNOSIS — E785 Hyperlipidemia, unspecified: Secondary | ICD-10-CM | POA: Diagnosis not present

## 2023-07-07 DIAGNOSIS — G4700 Insomnia, unspecified: Secondary | ICD-10-CM | POA: Diagnosis not present

## 2023-07-07 DIAGNOSIS — K219 Gastro-esophageal reflux disease without esophagitis: Secondary | ICD-10-CM | POA: Diagnosis not present

## 2023-07-07 DIAGNOSIS — Z008 Encounter for other general examination: Secondary | ICD-10-CM | POA: Diagnosis not present

## 2023-07-17 ENCOUNTER — Ambulatory Visit (INDEPENDENT_AMBULATORY_CARE_PROVIDER_SITE_OTHER): Payer: Medicare HMO | Admitting: Psychology

## 2023-07-17 DIAGNOSIS — F411 Generalized anxiety disorder: Secondary | ICD-10-CM | POA: Diagnosis not present

## 2023-07-17 DIAGNOSIS — F339 Major depressive disorder, recurrent, unspecified: Secondary | ICD-10-CM | POA: Diagnosis not present

## 2023-07-17 NOTE — Progress Notes (Signed)
[facility] Counselor/Therapist Progress Note  Patient ID: Lisa Oconnell, MRN: [id number]    Date: 07/17/23  Time Spent: 2:10  pm - 3:01 pm : 51 Minutes  Treatment Type: Individual Therapy.  Reported Symptoms: depression and anxiety.   Mental Status Exam: Appearance:  Neat and Well Groomed     Behavior: Appropriate  Motor: Normal  Speech/Language:  Clear and Coherent  Affect: Tearful  Mood: sad  Thought process: normal  Thought content:   WNL  Sensory/Perceptual disturbances:   WNL  Orientation: oriented to person, place, time/date, and situation  Attention: Good  Concentration: Good  Memory: WNL  Fund of knowledge:  Good  Insight:   Good  Judgment:  Good  Impulse Control: Good   Risk Assessment: Danger to Self:  No Self-injurious Behavior: No Danger to Others: No Duty to Warn:no Physical Aggression / Violence:No  Access to Firearms a concern: No  Gang Involvement:No   In case of a mental health emergency:  37 - confidential suicide hotline. Visiting Behavioral Health Urgent Care [facility]):        [address], [place] [address]       [phone] 3.   911  4.   Visiting Nearest ED.    Subjective:   Lisa Oconnell participated in the session, in person in the office with the therapist, and consented to treatment Lisa Oconnell reviewed the events of the past week. Lisa Oconnell was tearful during the session due to interpersonal stressors with her daughter, Lisa Oconnell.   We worker on exploring this and their dynamics history. She noted, from a young age, receiving consistent negative feedback from others. We reviewed numerous treatment approaches including CBT, BA, Problem Solving, and Solution focused therapy. Psych-education regarding the Lisa Oconnell's diagnosis of Generalized anxiety disorder  Depression, recurrent (HCC) was provided during the session. We discussed Lisa Oconnell's goals treatment goals which include manage stress and distress, minimize  negative self-talk, process past events including those affecting negative self-talk, psych-education regarding anxiety and depression, set and manage boundaries with self and other, identifying areas of control and lack of control, managing anxiety and depression, differentiating between various types of stress, working towards autonomy, building a health support system, engage in consistent self-care, bolster coping skills, improve self-esteem, being more mindful.  Lisa Oconnell provided verbal approval of the treatment plan. We reviewed safety due to pass history of SI and Lisa Oconnell was agreeable to this plan should there be a safety concern which denied during the session.    Interventions: Psycho-education & Goal Setting.   Diagnosis:   Generalized anxiety disorder  Depression, recurrent (HCC)  Psychiatric Treatment: Yes , via PCP. See chart.   Treatment Plan:  [name] is intelligent and motivated for change.   Support System: Dr. Veto Oconnell, Friends, Kids.   Client Treatment Preferences Outpatient patient.   Client Statement of Needs Lisa Oconnell would like to manage stress and distress, minimize negative self-talk, process past events including those affecting negative self-talk, psych-education regarding anxiety and depression, set and manage boundaries with self and other, identifying areas of control and lack of control, managing anxiety and depression, differentiating between various types of stress, working towards autonomy, building a health support system, engage in consistent self-care, bolster coping skills, improve self-esteem, being more mindful.    Treatment Level Weekly  Symptoms  Depression: loss of interest, feeling down, poor eating, feeling bad about self, trouble concentrating, psycho-motor retardation.  (Status: maintained) Anxiety: feeling anxious, difficulty managing worry,  worrying too much about different things trouble relaxing,  restlessness, irritability, feeling afraid something awful might happen.    (Status: maintained)  Goals:   Sanaiya experiences symptoms of depression and anxiety.  Treatment plan signed and available on s-drive:  Yes    Target Date: 07/16/24 Frequency: Weekly  Progress: 0 Modality: individual    Therapist will provide referrals for additional resources as appropriate.  Therapist will provide psycho-education regarding Tamiah's diagnosis and corresponding treatment approaches and interventions. Licensed Clinical Social Worker, [place], LCSW will support the patient's ability to achieve the goals identified. will employ CBT, BA, Problem-solving, Solution Focused, Mindfulness,  coping skills, & other evidenced-based practices will be used to promote progress towards healthy functioning to help manage decrease symptoms associated with her diagnosis.   Reduce overall level, frequency, and intensity of the feelings of depression, anxiety and panic evidenced by decreased overall symptoms from 6 to 7 days/week to 0 to 1 days/week per client report for at least 3 consecutive months. Verbally express understanding of the relationship between feelings of depression, anxiety and their impact on thinking patterns and behaviors. Verbalize an understanding of the role that distorted thinking plays in creating fears, excessive worry, and ruminations.  (Zakya participated in the creation of the treatment plan)   [name], LCSW

## 2023-07-18 ENCOUNTER — Encounter: Payer: Self-pay | Admitting: Family Medicine

## 2023-07-18 ENCOUNTER — Ambulatory Visit (INDEPENDENT_AMBULATORY_CARE_PROVIDER_SITE_OTHER): Payer: Medicare HMO | Admitting: Family Medicine

## 2023-07-18 VITALS — BP 118/70 | HR 58 | Temp 98.0°F | Ht 62.0 in | Wt 102.2 lb

## 2023-07-18 DIAGNOSIS — F339 Major depressive disorder, recurrent, unspecified: Secondary | ICD-10-CM | POA: Diagnosis not present

## 2023-07-18 NOTE — Progress Notes (Signed)
[facility] [address] [place] [address] Dept: [phone] Dept Fax: [phone]  Chronic Care Office Visit  Subjective:    Patient ID: Lisa Oconnell, female    DOB: [birth year], 82 y.o..   MRN: [id number]  Chief Complaint  Patient presents with   Depression    6 week f/u.     History of Present Illness:  Patient is in today for reassessment of chronic medical issues.  Lisa Oconnell has a history of depression and anxiety. Over the past year, much of this has been triggered by marital stress that has eventually ended in separation. She also has had a strained relationships with her children. She has some friends for support, but is spending much of her time alone. She has a sense that she is not allowed to make any decisions on her own. Her family and friends are regularly telling her what she should do. She finds this frustrating. She is currently on fluoxetine 60 mg daily and uses alprazolam 0.5 mg 1 1/2 tab at bedtime. Lisa Oconnell has started seeing a counselor and notes she is opening up about all of the issues she struggles with. She feels she is sleeping better, notes less tearfulness, and is getting out to walk with her dog regularly.  Past Medical History: Patient Active Problem List   Diagnosis Date Noted   Seborrheic keratoses 05/02/2023   Melanocytic nevi of trunk 05/02/2023   Lipoma of back 05/02/2023   History of dysplastic nevus 05/02/2023   Mild cognitive impairment 05/02/2023   Auditory hallucinations 05/02/2023   Tinnitus aurium, bilateral 11/21/2022   Depression, recurrent (HCC) 02/27/2022   Generalized anxiety disorder 02/27/2022   Insomnia 02/27/2022   History of tuberculosis 02/27/2022   History of colon polyps 02/27/2022   Hyperlipidemia 02/27/2022   Osteopenia 02/27/2022   Lichen simplex chronicus 02/27/2022   Sebopsoriasis 02/26/2022   Sacroiliac joint pain 02/10/2022   Lumbar spondylosis  01/16/2022   HNP (herniated nucleus pulposus), lumbar 02/17/2021   Neurogenic claudication 01/25/2021   Degenerative lumbar spinal stenosis 01/25/2021   History of prosthetic unicompartmental arthroplasty of right knee 04/01/2020   OA (osteoarthritis) of knee 03/28/2017   Acute medial meniscal tear 04/11/2016   Past Surgical History:  Procedure Laterality Date   BREAST EXCISIONAL BIOPSY Right    benign/ 82 years old   BUNIONECTOMY     left   CATARACT EXTRACTION W/ INTRAOCULAR LENS  IMPLANT, BILATERAL  2013   cool sculpting     both arms and back   DILATION AND CURETTAGE OF UTERUS     EYE SURGERY     HAND CONTRACTURE RELEASE     trigger finger   KNEE ARTHROSCOPY WITH MEDIAL MENISECTOMY Right 04/11/2016   Procedure: KNEE ARTHROSCOPY WITH MEDIAL MENISECTOMY AND CONDROPLASTY;  Surgeon: Ollen Gross, MD;  Location: [facility];  Service: Orthopedics;  Laterality: Right;   liposuction (15 yrs ago     stomach   LUMBAR LAMINECTOMY/DECOMPRESSION MICRODISCECTOMY N/A 02/17/2021   Procedure: Microlumbar Decompression Lumbar one-two, Lumbar two-three, Lumbar three-four;  Surgeon: Jene Every, MD;  Location: [facility];  Service: Orthopedics;  Laterality: N/A;   PARTIAL KNEE ARTHROPLASTY Right 03/28/2017   Procedure: UNICOMPARTMENTAL RIGHT  KNEE;  Surgeon: Ollen Gross, MD;  Location: [facility];  Service: Orthopedics;  Laterality: Right;  Adductor Block   TUBAL LIGATION  1978   Family History  Problem Relation Age of Onset   Alzheimer's disease Mother    Heart disease Father  Heart attack Father    Diabetes Sister    Diabetes Brother    Cancer Daughter        Non-Hodgkins Lymphoma   Diabetes Paternal Aunt    Cancer Grandson        Leukemia   Diabetes Grandson        Type 1   Epilepsy Grandson    Breast cancer Neg Hx    Outpatient Medications Prior to Visit  Medication Sig Dispense Refill   acetaminophen (TYLENOL) 500 MG tablet Take 1,000 mg by mouth at bedtime.     ALPRAZolam (XANAX) 0.5  MG tablet Take 0.5 mg by mouth at bedtime as needed for anxiety or sleep.     FLUoxetine (PROZAC) 20 MG capsule Take 1 capsule (20 mg total) by mouth daily. Take together with 40 mg capsule (total daily dose of 60 mg daily). 90 capsule 3   FLUoxetine (PROZAC) 40 MG capsule Take 1 capsule (40 mg total) by mouth daily. Take together with 20 mg tablet (total daily dose of 60 mg a day) 90 capsule 3   amoxicillin-clavulanate (AUGMENTIN) 875-125 MG tablet Take 1 tablet by mouth 2 (two) times daily. 20 tablet 0   No facility-administered medications prior to visit.   Allergies  Allergen Reactions   Sulfa Antibiotics Swelling    Mouth blistered,    Ambien [Zolpidem] Other (See Comments)   Objective:   Today's Vitals   07/18/23 0905  BP: 118/70  Pulse: (!) 58  Temp: 98 F (36.7 C)  TempSrc: Temporal  SpO2: 98%  Weight: 102 lb 3.2 oz (46.4 kg)  Height: 5\' 2"  (1.575 m)   Body mass index is 18.69 kg/m.   General: Well developed, well nourished. No acute distress. Psych: Alert and oriented. Normal mood and affect.  There are no preventive care reminders to display for this patient.    Assessment & Plan:   Problem List Items Addressed This Visit       Other   Depression, recurrent (HCC) - Primary    Lisa Oconnell's mood appears to be mildly improved. I have encouraged her to continue her counseling. Continue fluoxetine 60 mg daily.       Return in about 2 months (around 09/17/2023) for Reassessment.   Loyola Mast, MD

## 2023-07-18 NOTE — Assessment & Plan Note (Signed)
Lisa Oconnell mood appears to be mildly improved. I have encouraged her to continue her counseling. Continue fluoxetine 60 mg daily.

## 2023-07-31 ENCOUNTER — Ambulatory Visit: Payer: Medicare HMO | Admitting: Psychology

## 2023-07-31 DIAGNOSIS — F411 Generalized anxiety disorder: Secondary | ICD-10-CM

## 2023-07-31 DIAGNOSIS — F339 Major depressive disorder, recurrent, unspecified: Secondary | ICD-10-CM

## 2023-07-31 NOTE — Progress Notes (Signed)
[facility] Counselor/Therapist Progress Note  Patient ID: Lisa Oconnell, MRN: [id number]    Date: 07/31/23  Time Spent: 1:11 pm - 2:05  pm :  54    Minutes  Treatment Type: Individual Therapy.  Reported Symptoms: depression and anxiety.   Mental Status Exam: Appearance:  Neat and Well Groomed     Behavior: Appropriate  Motor: Normal  Speech/Language:  Clear and Coherent  Affect: Tearful  Mood: sad  Thought process: normal  Thought content:   WNL  Sensory/Perceptual disturbances:   WNL  Orientation: oriented to person, place, time/date, and situation  Attention: Good  Concentration: Good  Memory: WNL  Fund of knowledge:  Good  Insight:   Good  Judgment:  Good  Impulse Control: Good   Risk Assessment: Danger to Self:  No Self-injurious Behavior: No Danger to Others: No Duty to Warn:no Physical Aggression / Violence:No  Access to Firearms a concern: No  Gang Involvement:No   In case of a mental health emergency:  23 - confidential suicide hotline. [facility]):        [address], [place] [address]       [phone] 3.   911  4.   Visiting Nearest ED.    Subjective:   Lisa Oconnell participated in the session, in person in the office with the therapist, and consented to treatment Lisa Oconnell reviewed the events of the past week. She noted being stressed due to arriving late to the session and noted thinking "I don't get anything right". She noted this this "upsetting her" and "makes me believe that everything my children say is true". We explored this during the session. She noted significant messages in childhood that communicated her lack of importance or how people saw her. 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 She noted being moved out of her room, numerous times including after her  sister's marriage and her aunt and uncle moving in. She noted her mother caring for her daughter Lisa Oconnell) as Lisa Oconnell was dealing with a TB diagnosis for 6 months and noted that her mother "wanted to keep her" long-term after Lisa Oconnell's health improved. Lisa Oconnell noted that her daughter, Lisa Oconnell, was placed on a pedestal in comparison to the other grandchild. She noted her mother not speaking with her for one year. She noted feeling as though "I couldn't take care of my child". Therapist highlighted frequent and consistent negative self-talk and provided psycho-education regarding negative self-talk and the effect on mood. We worked on building flexibility regarding how we see events and to reframe experiences. Therapist modeled this during the session. Lisa Oconnell was engaged and motivated during the session. We scheduled a follow-up during the session for continued treatment. Therapist provided supportive therapy.    Interventions: interpersonal and CBT  Diagnosis:   Generalized anxiety disorder  Depression, recurrent (HCC)  Psychiatric Treatment: Yes , via PCP. See chart.   Treatment Plan:  [name] is intelligent and motivated for change.   Support System: Lisa Oconnell, Friends, Kids.   Client Treatment Preferences Outpatient patient.   Client Statement of Needs Lisa Oconnell would like to manage stress and distress, minimize negative self-talk, process past events including those affecting negative self-talk, psych-education regarding anxiety and depression, set and manage boundaries with self and other, identifying areas of control and lack of  control, managing anxiety and depression, differentiating between various types of stress, working towards autonomy, building a health support system, engage in consistent self-care, bolster coping skills, improve self-esteem, being more mindful.    Treatment Level Weekly  Symptoms  Depression: loss of interest, feeling down, poor eating,  feeling bad about self, trouble concentrating, psycho-motor retardation.  (Status: maintained) Anxiety: feeling anxious, difficulty managing worry, worrying too much about different things trouble relaxing, restlessness, irritability, feeling afraid something awful might happen.    (Status: maintained)  Goals:   [name] experiences symptoms of depression and anxiety.  Treatment plan signed and available on s-drive:  Yes    Target Date: 07/16/24 Frequency: Weekly  Progress: 0 Modality: individual    Therapist will provide referrals for additional resources as appropriate.  Therapist will provide psycho-education regarding [name]'s diagnosis and corresponding treatment approaches and interventions. Licensed Clinical Social Worker, [place], LCSW will support the patient's ability to achieve the goals identified. will employ CBT, BA, Problem-solving, Solution Focused, Mindfulness,  coping skills, & other evidenced-based practices will be used to promote progress towards healthy functioning to help manage decrease symptoms associated with her diagnosis.   Reduce overall level, frequency, and intensity of the feelings of depression, anxiety and panic evidenced by decreased overall symptoms from 6 to 7 days/week to 0 to 1 days/week per client report for at least 3 consecutive months. Verbally express understanding of the relationship between feelings of depression, anxiety and their impact on thinking patterns and behaviors. Verbalize an understanding of the role that distorted thinking plays in creating fears, excessive worry, and ruminations.  ([name] participated in the creation of the treatment plan)   [name], LCSW

## 2023-08-02 ENCOUNTER — Ambulatory Visit: Payer: Medicare HMO | Admitting: Family Medicine

## 2023-08-20 ENCOUNTER — Ambulatory Visit: Payer: Medicare HMO | Admitting: Psychology

## 2023-08-20 DIAGNOSIS — F411 Generalized anxiety disorder: Secondary | ICD-10-CM | POA: Diagnosis not present

## 2023-08-20 DIAGNOSIS — F339 Major depressive disorder, recurrent, unspecified: Secondary | ICD-10-CM | POA: Diagnosis not present

## 2023-08-20 NOTE — Progress Notes (Unsigned)
[facility] Counselor/Therapist Progress Note  Patient ID: Lisa Oconnell, MRN: [id number]    Date: 08/20/23  Time Spent: 2:31 pm - 3:27pm :    56  Minutes  Treatment Type: Individual Therapy.  Reported Symptoms: depression and anxiety.   Mental Status Exam: Appearance:  Neat and Well Groomed     Behavior: Appropriate  Motor: Normal  Speech/Language:  Clear and Coherent  Affect: Tearful  Mood: sad  Thought process: normal  Thought content:   WNL  Sensory/Perceptual disturbances:   WNL  Orientation: oriented to person, place, time/date, and situation  Attention: Good  Concentration: Good  Memory: WNL  Fund of knowledge:  Good  Insight:   Good  Judgment:  Good  Impulse Control: Good   Risk Assessment: Danger to Self:  No Self-injurious Behavior: No Danger to Others: No Duty to Warn:no Physical Aggression / Violence:No  Access to Firearms a concern: No  Gang Involvement:No   In case of a mental health emergency:  36 - confidential suicide hotline. Visiting [facility] Urgent Care [facility]):        [address], [place] [address]       [phone] 3.   911  4.   Visiting Nearest ED.    Subjective:   Lisa Oconnell participated in the session, in person in the office with the therapist, and consented to treatment Lisa Oconnell reviewed the events of the past week. She note having a few good days in a row and noted socializing with life-long friends. She presented as more energetic and positive. Therapist highlighted  Lisa Oconnell's negative self-talk during the session and challenged this during the session. She highlighted the lack of autonomy and feeling parented by her two daughters, specifically Lisa Oconnell. She noted her daughter making varying decisions  for Lisa Oconnell and Lisa Oconnell noted this affecting her ability to learn and make her own decisions. She noted wishing that she was more like her two best friends, including being being up beat, focusing  away from negatives, focusing on positive, flexible emotionally, frustration tolerance / distress tolerance, acceptance, manages stressors.     She noted being stressed due to arriving late to the session and noted thinking "I don't get anything right". She noted this this "upsetting her" and "makes me believe that everything my children say is true". We explored this during the session. She noted significant messages in childhood that communicated her lack of importance or how people saw her. 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 She noted being moved out of her room, numerous times including after her sister's marriage and her aunt and uncle moving in. She noted her mother caring for her daughter Lisa Oconnell) as Lisa Oconnell was dealing with a TB diagnosis for 6 months and noted that her mother "wanted to keep her" long-term after Lisa Oconnell's health improved. Lisa Oconnell noted that her daughter, Lisa Oconnell, was placed on a pedestal in comparison to the other grandchild. She noted her mother not speaking with her for one year. She noted feeling as though "I couldn't take care of my child". Therapist highlighted frequent and consistent negative self-talk and provided psycho-education regarding negative self-talk and the effect on mood. We worked on building flexibility regarding how we see events and to reframe experiences. Therapist modeled this during the session. Lisa Oconnell was engaged and motivated during the session. We scheduled  a follow-up during the session for continued treatment. Therapist provided supportive therapy.    Interventions: interpersonal and CBT  Diagnosis:   Generalized anxiety disorder  Depression, recurrent (HCC)  Psychiatric Treatment: Yes , via PCP. See chart.   Treatment Plan:  [name] is intelligent and motivated for change.   Support System: Dr. Veto Kemps,  Friends, Kids.   Client Treatment Preferences Outpatient patient.   Client Statement of Needs Lisa Oconnell would like to manage stress and distress, minimize negative self-talk, process past events including those affecting negative self-talk, psych-education regarding anxiety and depression, set and manage boundaries with self and other, identifying areas of control and lack of control, managing anxiety and depression, differentiating between various types of stress, working towards autonomy, building a health support system, engage in consistent self-care, bolster coping skills, improve self-esteem, being more mindful.    Treatment Level Weekly  Symptoms  Depression: loss of interest, feeling down, poor eating, feeling bad about self, trouble concentrating, psycho-motor retardation.  (Status: maintained) Anxiety: feeling anxious, difficulty managing worry, worrying too much about different things trouble relaxing, restlessness, irritability, feeling afraid something awful might happen.    (Status: maintained)  Goals:   Lisa Oconnell experiences symptoms of depression and anxiety.  Treatment plan signed and available on s-drive:  Yes    Target Date: 07/16/24 Frequency: Weekly  Progress: 0 Modality: individual    Therapist will provide referrals for additional resources as appropriate.  Therapist will provide psycho-education regarding Lisa Oconnell's diagnosis and corresponding treatment approaches and interventions. Licensed Clinical Social Worker, [place], LCSW will support the patient's ability to achieve the goals identified. will employ CBT, BA, Problem-solving, Solution Focused, Mindfulness,  coping skills, & other evidenced-based practices will be used to promote progress towards healthy functioning to help manage decrease symptoms associated with her diagnosis.   Reduce overall level, frequency, and intensity of the feelings of depression, anxiety and panic evidenced by decreased overall  symptoms from 6 to 7 days/week to 0 to 1 days/week per client report for at least 3 consecutive months. Verbally express understanding of the relationship between feelings of depression, anxiety and their impact on thinking patterns and behaviors. Verbalize an understanding of the role that distorted thinking plays in creating fears, excessive worry, and ruminations.  (Valetta participated in the creation of the treatment plan)   [name], LCSW

## 2023-08-27 ENCOUNTER — Ambulatory Visit: Payer: Medicare HMO | Admitting: Psychology

## 2023-08-27 DIAGNOSIS — F411 Generalized anxiety disorder: Secondary | ICD-10-CM

## 2023-08-27 DIAGNOSIS — F339 Major depressive disorder, recurrent, unspecified: Secondary | ICD-10-CM | POA: Diagnosis not present

## 2023-08-27 NOTE — Progress Notes (Signed)
[facility] Counselor/Therapist Progress Note  Patient ID: Lisa Oconnell, MRN: [id number]    Date: 08/27/23  Time Spent: 2:34 pm - 3:30 pm : 54 Minutes  Treatment Type: Individual Therapy.  Reported Symptoms: depression and anxiety.   Mental Status Exam: Appearance:  Neat and Well Groomed     Behavior: Appropriate  Motor: Normal  Speech/Language:  Clear and Coherent  Affect: Congruent  Mood: dysthymic  Thought process: normal  Thought content:   WNL  Sensory/Perceptual disturbances:   WNL  Orientation: oriented to person, place, time/date, and situation  Attention: Good  Concentration: Good  Memory: WNL  Fund of knowledge:  Good  Insight:   Good  Judgment:  Good  Impulse Control: Good   Risk Assessment: Danger to Self:  No Self-injurious Behavior: No Danger to Others: No Duty to Warn:no Physical Aggression / Violence:No  Access to Firearms a concern: No  Gang Involvement:No   In case of a mental health emergency:  14 - confidential suicide hotline. Visiting [facility] Urgent Care [facility]):        [address], [place] [address]       [phone] 3.   911  4.   Visiting Nearest ED.    Subjective:   Lisa Oconnell participated in the session, in person in the office with the therapist, and consented to treatment Lisa Oconnell reviewed the events of the past week. Lisa Oconnell noted being a "happy camper" prior to getting lot to the appointment. She noted working on challenging negative self-talk and generally being more mindful. She noted interpersonal stressors with her daughters due to missing a call and noted that her daughter's calling her neighbors and having someone visit her to ensure she is okay. We worked on identifying boundaries during the session and noted her worry that her daughter, Lisa Oconnell, would react poorly to any boundary. We explored this during the session and past behavior of being ignored by her family when Lisa Oconnell set  boundaries. She noted difficulty giving people feedback, even reasonable feedback, due to people's negative reaction to this. We processed this. We worked on reviewing assertiveness and positive clear communication. Therapist modeled this during the session. Therapist praised Lisa Oconnell for her effort to challenge her negative self-talk. We discussed the importance of setting reasonable expectations and communicating them in a positive and direct manner. Lisa Oconnell was engaged and motivated during the session. She expressed commitment towards goals. Therapist praised Lisa Oconnell and provided supportive therapy. A follow-up was scheduled for continued treatment.   Interventions: interpersonal and CBT  Diagnosis:   Generalized anxiety disorder  Depression, recurrent (HCC)  Psychiatric Treatment: Yes , via PCP. See chart.   Treatment Plan:  [name] is intelligent and motivated for change.   Support System: Dr. Veto Kemps, Friends, Kids.   Client Treatment Preferences Outpatient patient.   Client Statement of Needs Lisa Oconnell would like to manage stress and distress, minimize negative self-talk, process past events including those affecting negative self-talk, psych-education regarding anxiety and depression, set and manage boundaries with self and other, identifying areas of control and lack of control, managing anxiety and depression, differentiating between various types of stress, working towards autonomy, building a health support system, engage in consistent self-care, bolster coping skills, improve self-esteem, being more mindful.    Treatment Level Weekly  Symptoms  Depression: loss of interest, feeling down, poor eating, feeling bad about self, trouble concentrating, psycho-motor retardation.  (Status: maintained) Anxiety: feeling anxious, difficulty managing worry, worrying too much  about different things trouble relaxing, restlessness, irritability, feeling afraid something  awful might happen.    (Status: maintained)  Goals:   Lisa Oconnell experiences symptoms of depression and anxiety.  Treatment plan signed and available on s-drive:  Yes    Target Date: 07/16/24 Frequency: Weekly  Progress: 0 Modality: individual    Therapist will provide referrals for additional resources as appropriate.  Therapist will provide psycho-education regarding Lisa Oconnell's diagnosis and corresponding treatment approaches and interventions. Licensed Clinical Social Worker, Lisa [place], LCSW will support the patient's ability to achieve the goals identified. will employ CBT, BA, Problem-solving, Solution Focused, Mindfulness,  coping skills, & other evidenced-based practices will be used to promote progress towards healthy functioning to help manage decrease symptoms associated with her diagnosis.   Reduce overall level, frequency, and intensity of the feelings of depression, anxiety and panic evidenced by decreased overall symptoms from 6 to 7 days/week to 0 to 1 days/week per client report for at least 3 consecutive months. Verbally express understanding of the relationship between feelings of depression, anxiety and their impact on thinking patterns and behaviors. Verbalize an understanding of the role that distorted thinking plays in creating fears, excessive worry, and ruminations.  (Lisa Oconnell participated in the creation of the treatment plan)   Lisa Ovens, LCSW

## 2023-09-13 DIAGNOSIS — Z96651 Presence of right artificial knee joint: Secondary | ICD-10-CM | POA: Diagnosis not present

## 2023-09-13 DIAGNOSIS — M25561 Pain in right knee: Secondary | ICD-10-CM | POA: Diagnosis not present

## 2023-09-17 ENCOUNTER — Ambulatory Visit (INDEPENDENT_AMBULATORY_CARE_PROVIDER_SITE_OTHER): Payer: Medicare HMO | Admitting: Family Medicine

## 2023-09-17 VITALS — BP 120/70 | HR 60 | Temp 98.0°F | Ht 62.0 in | Wt 102.4 lb

## 2023-09-17 DIAGNOSIS — F411 Generalized anxiety disorder: Secondary | ICD-10-CM

## 2023-09-17 DIAGNOSIS — F339 Major depressive disorder, recurrent, unspecified: Secondary | ICD-10-CM | POA: Diagnosis not present

## 2023-09-17 MED ORDER — ALPRAZOLAM 0.5 MG PO TABS: 0.5000 mg | ORAL_TABLET | Freq: Every evening | ORAL | 2 refills | Status: DC | PRN

## 2023-09-17 NOTE — Assessment & Plan Note (Signed)
Lisa Oconnell mood appears much improved. I have encouraged her to continue her counseling, esp. in light of upcoming Holidays and pending divorce proceedings in march. Continue fluoxetine 60 mg daily.

## 2023-09-17 NOTE — Progress Notes (Signed)
[facility] [address] [place] [address] Dept: [phone] Dept Fax: [phone]  Chronic Care Office Visit  Subjective:    Patient ID: Lisa Oconnell, female    DOB: 07-27-1941, 82 y.o..   MRN: [id number]  Chief Complaint  Patient presents with   Follow-up    2 month f/u.    History of Present Illness:  Patient is in today for reassessment of chronic medical issues.  Lisa Oconnell has a history of depression and anxiety. Over the past year, much of this has been triggered by marital stress that has eventually ended in separation. She also has had a strained relationships with her children. She has some friends for support, but had been spending much of her time alone. She is currently on fluoxetine 60 mg daily and uses alprazolam 0.5 mg 1 1/2 tab at bedtime. Lisa Oconnell had newly started counseling at our last visit in Sept. She has been finding this helpful.  She feels her mood is improving. She is having a better relationship with one of her daughters. She is getting out to walk more. She is doing better at setting boundaries with her ex-husband. She is also going out some with friends. She overall is feeling much better. She is looking forward to the upcoming holidays and spending time with family and friends.  Past Medical History: Patient Active Problem List   Diagnosis Date Noted   Seborrheic keratoses 05/02/2023   Melanocytic nevi of trunk 05/02/2023   Lipoma of back 05/02/2023   History of dysplastic nevus 05/02/2023   Mild cognitive impairment 05/02/2023   Auditory hallucinations 05/02/2023   Tinnitus aurium, bilateral 11/21/2022   Depression, recurrent (HCC) 02/27/2022   Generalized anxiety disorder 02/27/2022   Insomnia 02/27/2022   History of tuberculosis 02/27/2022   History of colon polyps 02/27/2022   Hyperlipidemia 02/27/2022   Osteopenia 02/27/2022   Lichen simplex chronicus 02/27/2022   Sebopsoriasis  02/26/2022   Sacroiliac joint pain 02/10/2022   Lumbar spondylosis 01/16/2022   HNP (herniated nucleus pulposus), lumbar 02/17/2021   Neurogenic claudication 01/25/2021   Degenerative lumbar spinal stenosis 01/25/2021   History of prosthetic unicompartmental arthroplasty of right knee 04/01/2020   OA (osteoarthritis) of knee 03/28/2017   Acute medial meniscal tear 04/11/2016   Past Surgical History:  Procedure Laterality Date   BREAST EXCISIONAL BIOPSY Right    benign/ 82 years old   BUNIONECTOMY     left   CATARACT EXTRACTION W/ INTRAOCULAR LENS  IMPLANT, BILATERAL  2013   cool sculpting     both arms and back   DILATION AND CURETTAGE OF UTERUS     EYE SURGERY     HAND CONTRACTURE RELEASE     trigger finger   KNEE ARTHROSCOPY WITH MEDIAL MENISECTOMY Right 04/11/2016   Procedure: KNEE ARTHROSCOPY WITH MEDIAL MENISECTOMY AND CONDROPLASTY;  Surgeon: Ollen Gross, MD;  Location: [facility];  Service: Orthopedics;  Laterality: Right;   liposuction (15 yrs ago     stomach   LUMBAR LAMINECTOMY/DECOMPRESSION MICRODISCECTOMY N/A 02/17/2021   Procedure: Microlumbar Decompression Lumbar one-two, Lumbar two-three, Lumbar three-four;  Surgeon: Jene Every, MD;  Location: [facility];  Service: Orthopedics;  Laterality: N/A;   PARTIAL KNEE ARTHROPLASTY Right 03/28/2017   Procedure: UNICOMPARTMENTAL RIGHT  KNEE;  Surgeon: Ollen Gross, MD;  Location: [facility];  Service: Orthopedics;  Laterality: Right;  Adductor Block   TUBAL LIGATION  1978   Family History  Problem Relation Age of Onset  Alzheimer's disease Mother    Heart disease Father    Heart attack Father    Diabetes Sister    Diabetes Brother    Cancer Daughter        Non-Hodgkins Lymphoma   Diabetes Paternal Aunt    Cancer Grandson        Leukemia   Diabetes Grandson        Type 1   Epilepsy Grandson    Breast cancer Neg Hx    Outpatient Medications Prior to Visit  Medication Sig Dispense Refill   acetaminophen (TYLENOL) 500 MG  tablet Take 1,000 mg by mouth at bedtime.     FLUoxetine (PROZAC) 20 MG capsule Take 1 capsule (20 mg total) by mouth daily. Take together with 40 mg capsule (total daily dose of 60 mg daily). 90 capsule 3   FLUoxetine (PROZAC) 40 MG capsule Take 1 capsule (40 mg total) by mouth daily. Take together with 20 mg tablet (total daily dose of 60 mg a day) 90 capsule 3   ALPRAZolam (XANAX) 0.5 MG tablet Take 0.5 mg by mouth at bedtime as needed for anxiety or sleep.     No facility-administered medications prior to visit.   Allergies  Allergen Reactions   Sulfa Antibiotics Swelling    Mouth blistered,    Ambien [Zolpidem] Other (See Comments)   Objective:   Today's Vitals   09/17/23 0923  BP: 120/70  Pulse: 60  Temp: 98 F (36.7 C)  TempSrc: Temporal  SpO2: 100%  Weight: 102 lb 6.4 oz (46.4 kg)  Height: 5\' 2"  (1.575 m)   Body mass index is 18.73 kg/m.   General: Well developed, well nourished. No acute distress. Psych: Alert and oriented. Normal mood and affect.  There are no preventive care reminders to display for this patient.    Assessment & Plan:   Problem List Items Addressed This Visit       Other   Depression, recurrent (HCC) - Primary    Lisa Oconnell's mood appears much improved. I have encouraged her to continue her counseling, esp. in light of upcoming Holidays and pending divorce proceedings in march. Continue fluoxetine 60 mg daily.      Relevant Medications   ALPRAZolam (XANAX) 0.5 MG tablet   Generalized anxiety disorder    Stable/improved. Cotninue alprazolam 0.5 mg 1 1/2 tab at bedtime.      Relevant Medications   ALPRAZolam (XANAX) 0.5 MG tablet    Return in about 3 months (around 12/18/2023) for Annual preventative care.   Loyola Mast, MD

## 2023-09-17 NOTE — Assessment & Plan Note (Signed)
Stable/improved. Cotninue alprazolam 0.5 mg 1 1/2 tab at bedtime.

## 2023-09-24 ENCOUNTER — Ambulatory Visit: Payer: Medicare HMO | Admitting: Psychology

## 2023-09-24 DIAGNOSIS — F339 Major depressive disorder, recurrent, unspecified: Secondary | ICD-10-CM | POA: Diagnosis not present

## 2023-09-24 DIAGNOSIS — F411 Generalized anxiety disorder: Secondary | ICD-10-CM

## 2023-09-24 NOTE — Progress Notes (Signed)
[facility] Counselor/Therapist Progress Note  Patient ID: Lisa Oconnell, MRN: [id number]    Date: 09/24/23  Time Spent: 12:33 pm - 1:34 pm : 61 Minutes  Treatment Type: Individual Therapy.  Reported Symptoms: depression and anxiety.   Mental Status Exam: Appearance:  Neat and Well Groomed     Behavior: Appropriate  Motor: Normal  Speech/Language:  Clear and Coherent  Affect: Congruent and Tearful   Mood: dysthymic  Thought process: normal  Thought content:   WNL  Sensory/Perceptual disturbances:   WNL  Orientation: oriented to person, place, time/date, and situation  Attention: Good  Concentration: Good  Memory: WNL  Fund of knowledge:  Good  Insight:   Good  Judgment:  Good  Impulse Control: Good   Risk Assessment: Danger to Self:  No Self-injurious Behavior: No Danger to Others: No Duty to Warn:no Physical Aggression / Violence:No  Access to Firearms a concern: No  Gang Involvement:No   In case of a mental health emergency:  75 - confidential suicide hotline. [facility]):        [address], [place] [address]       [phone] 3.   911  4.   Visiting Nearest ED.    Subjective:   Lisa Oconnell participated in the session, in person in the office with the therapist, and consented to treatment Lisa Oconnell reviewed the events of the past week. Lisa Oconnell noted feeling like she is in "never never land" and noted "I don't have any control over anything and don't care".  We worked on exploring this, during the session. Lisa Oconnell noted that he daughter, Lisa Oconnell, is treating her well and attributed it to [facility] weight-loss and "feeling good about herself".  Lisa Oconnell noted anxiety regarding this and that Lisa Oconnell would revert back to her previous form. She noted that she "usually does it all" in relation to thanksgiving dinner. She noted receiving short notice about this. She noted her feelings hurt feelings that these  plans were made between her daughters without Lisa Oconnell's input. She noted uncertainty that the plans won't change again. Therapist highlighted Lisa Oconnell's "should and shouldn't" statements and worked on identifying possible evidence for and against said statements. Therapist challenged Lisa Oconnell during the session. Lisa Oconnell noted feeling quite good yesterday and that today she has not been feeling great due to an interaction with her daughter. We explored this during the session. She noted anxiety regarding areas she cannot control including her daughter's behavior, her soon to be ex-husband's behavior. We explored this during the session. She not having a sense of self and needing to define self. Therapist encouraged Lisa Oconnell to work on defining who she is to be processed going forward. Therapist provided psycho-education regarding anxiety, self-talk, and the importance of challenging negative self-talks and distortions. Therapist modeled this during the session. Therapist encouraged mindfulness in this areas. Therapist modeled assertiveness during the session. Lisa Oconnell was tearful but engaged during the session. Therapist praised Lisa Oconnell for her effort and provided supportive therapy. A follow-up was scheduled for continued treatment which she benefits from. Therapist encouraged Lisa Oconnell to manage her expectations.    Interventions: interpersonal and CBT  Diagnosis:   Generalized anxiety disorder  Depression, recurrent (HCC)  Psychiatric Treatment: Yes , via PCP. See chart.   Treatment Plan:  [name] is intelligent and motivated for change.   Support System: Lisa Oconnell, Friends, Kids.   Client Treatment Preferences Outpatient patient.   Client Statement of Needs Lisa Oconnell would  like to manage stress and distress, minimize negative self-talk, process past events including those affecting negative self-talk, psych-education regarding anxiety and depression, set and manage boundaries with  self and other, identifying areas of control and lack of control, managing anxiety and depression, differentiating between various types of stress, working towards autonomy, building a health support system, engage in consistent self-care, bolster coping skills, improve self-esteem, being more mindful.    Treatment Level Weekly  Symptoms  Depression: loss of interest, feeling down, poor eating, feeling bad about self, trouble concentrating, psycho-motor retardation.  (Status: maintained) Anxiety: feeling anxious, difficulty managing worry, worrying too much about different things trouble relaxing, restlessness, irritability, feeling afraid something awful might happen.    (Status: maintained)  Goals:   Lisa Oconnell experiences symptoms of depression and anxiety.  Treatment plan signed and available on s-drive:  Yes    Target Date: 07/16/24 Frequency: Weekly  Progress: 0 Modality: individual    Therapist will provide referrals for additional resources as appropriate.  Therapist will provide psycho-education regarding Lisa Oconnell's diagnosis and corresponding treatment approaches and interventions. Licensed Clinical Social Worker, [place], LCSW will support the patient's ability to achieve the goals identified. will employ CBT, BA, Problem-solving, Solution Focused, Mindfulness,  coping skills, & other evidenced-based practices will be used to promote progress towards healthy functioning to help manage decrease symptoms associated with her diagnosis.   Reduce overall level, frequency, and intensity of the feelings of depression, anxiety and panic evidenced by decreased overall symptoms from 6 to 7 days/week to 0 to 1 days/week per client report for at least 3 consecutive months. Verbally express understanding of the relationship between feelings of depression, anxiety and their impact on thinking patterns and behaviors. Verbalize an understanding of the role that distorted thinking plays in  creating fears, excessive worry, and ruminations.  (Hang participated in the creation of the treatment plan)   [name], LCSW

## 2023-10-08 ENCOUNTER — Telehealth: Payer: Self-pay | Admitting: Family Medicine

## 2023-10-08 ENCOUNTER — Ambulatory Visit: Payer: Medicare HMO | Admitting: Psychology

## 2023-10-08 DIAGNOSIS — F339 Major depressive disorder, recurrent, unspecified: Secondary | ICD-10-CM | POA: Diagnosis not present

## 2023-10-08 DIAGNOSIS — F411 Generalized anxiety disorder: Secondary | ICD-10-CM | POA: Diagnosis not present

## 2023-10-08 NOTE — Telephone Encounter (Signed)
Pt is needing all prescriptions sent to [facility] from now on.   She is needing a refill on her ALPRAZolam (XANAX) 0.5 MG tablet [[id number]]  [facility]  Pt at [phone]

## 2023-10-08 NOTE — Progress Notes (Signed)
[facility] Counselor/Therapist Progress Note  Patient ID: Lisa Oconnell, MRN: [id number]    Date: 10/08/23  Time Spent: 12:45 pm - 1:35 pm : 50 Minutes  Treatment Type: Individual Therapy.  Reported Symptoms: depression and anxiety.   Mental Status Exam: Appearance:  Neat and Well Groomed     Behavior: Appropriate  Motor: Normal  Speech/Language:  Clear and Coherent  Affect: Congruent and Tearful   Mood: dysthymic  Thought process: normal  Thought content:   WNL  Sensory/Perceptual disturbances:   WNL  Orientation: oriented to person, place, time/date, and situation  Attention: Good  Concentration: Good  Memory: WNL  Fund of knowledge:  Good  Insight:   Good  Judgment:  Good  Impulse Control: Good   Risk Assessment: Danger to Self:  No Self-injurious Behavior: No Danger to Others: No Duty to Warn:no Physical Aggression / Violence:No  Access to Firearms a concern: No  Gang Involvement:No   In case of a mental health emergency:  52 - confidential suicide hotline. [facility]):        [address], [place] [address]       [phone] 3.   911  4.   Visiting Nearest ED.    Subjective:   [name] participated in the session, in person in the office with the therapist, and consented to treatment Tyonna reviewed the events of the past week. She noted that she "was doing great" until she got lost getting to the office. She recently had a conversation with her soon-to-be ex-husband and noted providing feedback regarding his behavior. She noted this conversation going well, overall. She noted not enjoying her thanksgiving due to her family being directive towards her during the plans. She noted that it "bothers me that they tell me what to do and not to do". She noted a need to give her family feedback but noted that "I don't know how to give them feedback". She noted it being difficulty to set  boundaries due to not having self-confidence and not wanting to upset people. We worked on exploring this during the session. Therapist challenged this, during the session, via evidence provided during the session. Therapist highlighted Sybella's jump to conclusion regarding how her children would reaction. We worked on exploring this during the session. Therapist encouraged Hermena to work on challenging her negative self-talk and working on not generalizing the day based off of a negative experience. Therapist modeled this self-talk during the session. Antione was engaged and motivated during the session. A follow-up was scheduled for continued treatment which Jataya benefits from. Therapist provided supportive therapy.   Interventions: interpersonal and CBT  Diagnosis:   Generalized anxiety disorder  Depression, recurrent (HCC)  Psychiatric Treatment: Yes , via PCP. See chart.   Treatment Plan:  Client Abilities/Strengths Brelee is intelligent and motivated for change.   Support System: Dr. Veto Kemps, Friends, Kids.   Client Treatment Preferences Outpatient patient.   Client Statement of Needs Sharea would like to manage stress and distress, minimize negative self-talk, process past events including those affecting negative self-talk, psych-education regarding anxiety and depression, set and manage boundaries with self and other, identifying areas of control and lack of control, managing anxiety and depression, differentiating between various types of stress, working towards autonomy, building a health support system, engage in consistent self-care, bolster coping skills, improve self-esteem, being more mindful.    Treatment Level Weekly  Symptoms  Depression: loss of interest, feeling down,  poor eating, feeling bad about self, trouble concentrating, psycho-motor retardation.  (Status: maintained) Anxiety: feeling anxious, difficulty managing worry, worrying too much about different things  trouble relaxing, restlessness, irritability, feeling afraid something awful might happen.    (Status: maintained)  Goals:   Waunita experiences symptoms of depression and anxiety.  Treatment plan signed and available on s-drive:  Yes    Target Date: 07/16/24 Frequency: Weekly  Progress: 0 Modality: individual    Therapist will provide referrals for additional resources as appropriate.  Therapist will provide psycho-education regarding Story's diagnosis and corresponding treatment approaches and interventions. Licensed Clinical Social Worker, [place], LCSW will support the patient's ability to achieve the goals identified. will employ CBT, BA, Problem-solving, Solution Focused, Mindfulness,  coping skills, & other evidenced-based practices will be used to promote progress towards healthy functioning to help manage decrease symptoms associated with her diagnosis.   Reduce overall level, frequency, and intensity of the feelings of depression, anxiety and panic evidenced by decreased overall symptoms from 6 to 7 days/week to 0 to 1 days/week per client report for at least 3 consecutive months. Verbally express understanding of the relationship between feelings of depression, anxiety and their impact on thinking patterns and behaviors. Verbalize an understanding of the role that distorted thinking plays in creating fears, excessive worry, and ruminations.  (Kayliee participated in the creation of the treatment plan)   [name], LCSW

## 2023-10-09 MED ORDER — ALPRAZOLAM 0.5 MG PO TABS: 0.5000 mg | ORAL_TABLET | Freq: Every evening | ORAL | 2 refills | Status: DC | PRN

## 2023-10-09 NOTE — Telephone Encounter (Signed)
LR 09/07/23, #60, 2 rf ([facility]) LOV 09/07/23  FOV  12/18/23  She wants this sent to [facility].  Changed the pharmacy location.   Please review and advise.  Thanks. Dm/cma

## 2023-10-22 ENCOUNTER — Ambulatory Visit: Payer: Medicare HMO | Admitting: Psychology

## 2023-10-22 DIAGNOSIS — F339 Major depressive disorder, recurrent, unspecified: Secondary | ICD-10-CM | POA: Diagnosis not present

## 2023-10-22 DIAGNOSIS — F411 Generalized anxiety disorder: Secondary | ICD-10-CM

## 2023-10-22 NOTE — Progress Notes (Signed)
[facility] Counselor/Therapist Progress Note  Patient ID: RAECHAL MCEVERS, MRN: [id number]    Date: 10/22/23  Time Spent: 2:17 pm - 3:14 pm : 57 Minutes  Treatment Type: Individual Therapy.  Reported Symptoms: depression and anxiety.   Mental Status Exam: Appearance:  Neat and Well Groomed     Behavior: Appropriate  Motor: Normal  Speech/Language:  Clear and Coherent  Affect: Congruent and Tearful   Mood: dysthymic  Thought process: normal  Thought content:   WNL  Sensory/Perceptual disturbances:   WNL  Orientation: oriented to person, place, time/date, and situation  Attention: Good  Concentration: Good  Memory: WNL  Fund of knowledge:  Good  Insight:   Good  Judgment:  Good  Impulse Control: Good   Risk Assessment: Danger to Self:  No Self-injurious Behavior: No Danger to Others: No Duty to Warn:no Physical Aggression / Violence:No  Access to Firearms a concern: No  Gang Involvement:No   In case of a mental health emergency:  16 - confidential suicide hotline. [facility]):        [address], [place] [address]       [phone] 3.   911  4.   Visiting Nearest ED.    Subjective:   Lisa Oconnell participated in the session, in person in the office with the therapist, and consented to treatment Lisa Oconnell reviewed the events of the past week. She noted electing to redecorate her tree and noted that he daughter, Lisa Oconnell, gave negative feedback about the decoration and Lisa Oconnell noted that she enjoyed the new decorations. Therapist praised Nurse, adult for advocating for self. Lisa Oconnell noted feeling a bit more empowered to communicate her needs. Therapist encouraged consistent effort in this area and to verbalize her thoughts and feelings positively and assertively. She noted feeling good and noted having a friend spend time at her house and noted this playing a part in her improvement in mood.  We discussed keeping a  gratitude journal and the benefits of this for mood and maintaining perspective. We discussed Lisa Oconnell including identifying positives in self, on a daily basis. She noted frustration regarding her daughter's lack of communication regarding picture taking and therapist highlighted jumps to conclusion and worked on processing the pros of cons of assuming intent. We worked on assuming best intent, during the session, and ways to be mindful of this going forward. She noted difficulty engaging in specific activities, such as online banking, due to feeling distracted, the task feeling overwhelming, and not knowing where to start the tasking. She noted feeling overwhelmed by how to start taking more control of her day-to-day responsibilities, such as bill paying and her checking account, which is currently handled by her daughter. We worked on exploring this. Lisa Oconnell was engaged and motivated during the session. She expressed commitment towards goals. Therapist praised Lisa Oconnell and provided supportive therapy. A follow-up was scheduled for continued treatment.   Interventions: interpersonal and CBT  Diagnosis:   Generalized anxiety disorder  Depression, recurrent (HCC)  Psychiatric Treatment: Yes , via PCP. See chart.   Treatment Plan:  [name] is intelligent and motivated for change.   Support System: Dr. Veto Kemps, Friends, Kids.   Client Treatment Preferences Outpatient patient.   Client Statement of Needs Lisa Oconnell would like to manage stress and distress, minimize negative self-talk, process past events including those affecting negative self-talk, psych-education regarding anxiety and depression, set and manage boundaries with self and other, identifying areas of control  and lack of control, managing anxiety and depression, differentiating between various types of stress, working towards autonomy, building a health support system, engage in consistent self-care, bolster coping  skills, improve self-esteem, being more mindful.    Treatment Level Weekly  Symptoms  Depression: loss of interest, feeling down, poor eating, feeling bad about self, trouble concentrating, psycho-motor retardation.  (Status: maintained) Anxiety: feeling anxious, difficulty managing worry, worrying too much about different things trouble relaxing, restlessness, irritability, feeling afraid something awful might happen.    (Status: maintained)  Goals:   Lisa Oconnell experiences symptoms of depression and anxiety.  Treatment plan signed and available on s-drive:  Yes    Target Date: 07/16/24 Frequency: Weekly  Progress: 0 Modality: individual    Therapist will provide referrals for additional resources as appropriate.  Therapist will provide psycho-education regarding Lisa Oconnell's diagnosis and corresponding treatment approaches and interventions. Licensed Clinical Social Worker, [place], LCSW will support the patient's ability to achieve the goals identified. will employ CBT, BA, Problem-solving, Solution Focused, Mindfulness,  coping skills, & other evidenced-based practices will be used to promote progress towards healthy functioning to help manage decrease symptoms associated with her diagnosis.   Reduce overall level, frequency, and intensity of the feelings of depression, anxiety and panic evidenced by decreased overall symptoms from 6 to 7 days/week to 0 to 1 days/week per client report for at least 3 consecutive months. Verbally express understanding of the relationship between feelings of depression, anxiety and their impact on thinking patterns and behaviors. Verbalize an understanding of the role that distorted thinking plays in creating fears, excessive worry, and ruminations.  (Lisa Oconnell participated in the creation of the treatment plan)   [name], LCSW

## 2023-11-05 ENCOUNTER — Ambulatory Visit: Payer: Medicare HMO | Admitting: Psychology

## 2023-11-05 DIAGNOSIS — F339 Major depressive disorder, recurrent, unspecified: Secondary | ICD-10-CM

## 2023-11-05 DIAGNOSIS — F411 Generalized anxiety disorder: Secondary | ICD-10-CM | POA: Diagnosis not present

## 2023-11-05 NOTE — Progress Notes (Signed)
[facility] Counselor/Therapist Progress Note  Patient ID: Lisa Oconnell, MRN: [id number]    Date: 11/05/23  Time Spent: 12:33 pm - 1:32 pm : 59 Minutes  Treatment Type: Individual Therapy.  Reported Symptoms: depression and anxiety.   Mental Status Exam: Appearance:  Neat and Well Groomed     Behavior: Appropriate  Motor: Normal  Speech/Language:  Clear and Coherent  Affect: Congruent   Mood: dysthymic  Thought process: normal  Thought content:   WNL  Sensory/Perceptual disturbances:   WNL  Orientation: oriented to person, place, time/date, and situation  Attention: Good  Concentration: Good  Memory: WNL  Fund of knowledge:  Good  Insight:   Good  Judgment:  Good  Impulse Control: Good   Risk Assessment: Danger to Self:  No Self-injurious Behavior: No Danger to Others: No Duty to Warn:no Physical Aggression / Violence:No  Access to Firearms a concern: No  Gang Involvement:No   In case of a mental health emergency:  51 - confidential suicide hotline. [facility]):        [address], [place] [address]       [phone] 3.   911  4.   Visiting Nearest ED.    Subjective:   Lisa Oconnell participated in the session, in person in the office with the therapist, and consented to treatment Lisa Oconnell reviewed the events of the past week. She noted not being "included in this and that" and that she "is not supposed to do this or that". She noted how she is "supposed" to act / behave. She noted frustration regarding not having autonomy due to her children. We worked on delineating her concerns. We identified that Lisa Oconnell feels a lack of autonomy, often feels lesser than and unable to complete tasks due to communication from daughters, and family of origin issues contributing to negative -self esteem including negative self-talk. We worked on processing their current dynamics and communication. Therapist  highlighted Lisa Oconnell's difficulty setting boundaries explicitly and explicitly requesting things of her family. Therapist highlighted Lisa Oconnell's negative self-talk, jump to conclusions, and emotional reasoning. Therapist challenged this during the session, provided psycho-education regarding self-talk and distortions, and encouraged Lisa Oconnell to keep a log of her negative self-talk and the stressors related to, ex. Communication with children. Therapist discussed the importance of mindfulness regarding how she feels, what could be contributing to those feelings. We will work on processing this going forward. Lisa Oconnell was engaged during the session and expressed commitment towards goals. Therapist praised Lisa Oconnell and provided supportive therapy. A follow-up was scheduled for continued treatment which Lisa Oconnell benefits from.   Interventions: interpersonal and CBT  Diagnosis:   Generalized anxiety disorder  Depression, recurrent (HCC)  Psychiatric Treatment: Yes , via PCP. See chart.   Treatment Plan:  [name] is intelligent and motivated for change.   Support System: Dr. Veto Kemps, Friends, Kids.   Client Treatment Preferences Outpatient patient.   Client Statement of Needs Lisa Oconnell would like to manage stress and distress, minimize negative self-talk, process past events including those affecting negative self-talk, psych-education regarding anxiety and depression, set and manage boundaries with self and other, identifying areas of control and lack of control, managing anxiety and depression, differentiating between various types of stress, working towards autonomy, building a health support system, engage in consistent self-care, bolster coping skills, improve self-esteem, being more mindful.    Treatment Level Weekly  Symptoms  Depression: loss of interest, feeling down, poor eating, feeling  bad about self, trouble concentrating, psycho-motor retardation.  (Status:  maintained) Anxiety: feeling anxious, difficulty managing worry, worrying too much about different things trouble relaxing, restlessness, irritability, feeling afraid something awful might happen.    (Status: maintained)  Goals:   [name] experiences symptoms of depression and anxiety.  Treatment plan signed and available on s-drive:  Yes    Target Date: 07/16/24 Frequency: Weekly  Progress: 0 Modality: individual    Therapist will provide referrals for additional resources as appropriate.  Therapist will provide psycho-education regarding Lisa Oconnell's diagnosis and corresponding treatment approaches and interventions. Licensed Clinical Social Worker, [place], LCSW will support the patient's ability to achieve the goals identified. will employ CBT, BA, Problem-solving, Solution Focused, Mindfulness,  coping skills, & other evidenced-based practices will be used to promote progress towards healthy functioning to help manage decrease symptoms associated with her diagnosis.   Reduce overall level, frequency, and intensity of the feelings of depression, anxiety and panic evidenced by decreased overall symptoms from 6 to 7 days/week to 0 to 1 days/week per client report for at least 3 consecutive months. Verbally express understanding of the relationship between feelings of depression, anxiety and their impact on thinking patterns and behaviors. Verbalize an understanding of the role that distorted thinking plays in creating fears, excessive worry, and ruminations.  (Lisa Oconnell participated in the creation of the treatment plan)   [name], LCSW

## 2023-11-19 ENCOUNTER — Ambulatory Visit: Payer: Medicare HMO | Admitting: Psychology

## 2023-11-19 DIAGNOSIS — F339 Major depressive disorder, recurrent, unspecified: Secondary | ICD-10-CM

## 2023-11-19 DIAGNOSIS — F411 Generalized anxiety disorder: Secondary | ICD-10-CM

## 2023-11-19 NOTE — Progress Notes (Signed)
 [facility] Counselor/Therapist Progress Note  Patient ID: Lisa Oconnell, MRN: [id number]    Date: 11/19/23  Time Spent: 1:33 pm -  2:27 pm : 54  Minutes  Treatment Type: Individual Therapy.  Reported Symptoms: depression and anxiety.   Mental Status Exam: Appearance:  Neat and Well Groomed     Behavior: Appropriate  Motor: Normal  Speech/Language:  Clear and Coherent  Affect: Congruent   Mood: dysthymic  Thought process: normal  Thought content:   WNL  Sensory/Perceptual disturbances:   WNL  Orientation: oriented to person, place, time/date, and situation  Attention: Good  Concentration: Good  Memory: WNL  Fund of knowledge:  Good  Insight:   Good  Judgment:  Good  Impulse Control: Good   Risk Assessment: Danger to Self:  No Self-injurious Behavior: No Danger to Others: No Duty to Warn:no Physical Aggression / Violence:No  Access to Firearms a concern: No  Gang Involvement:No   In case of a mental health emergency:  71 - confidential suicide hotline. [facility]):        [address], [place] [address]       [phone] 3.   911  4.   Visiting Nearest ED.    Subjective:   Lawrnce JONELLE Lerner participated in the session, in person in the office with the therapist, and consented to treatment Tayler reviewed the events of the past week. She noted having a positive two weeks prior to today due to running out of her anxiolytic. She noted getting a refill today. She noted the physiological symptoms of her anxiety including shaking all over and shaking inside. She noted I was doing so good and now I feel like crap. Additionally, increase BP, heart rate, and changes in breathing, chest tightening, & cold hands. Her coping skills include trying to breathing, playing with my dog, and distraction. Therapist provided psycho-education regarding anxiety and breathing during the session and discussed the  importance of managing breathing. She noted difficulty managing breathing due to her past experience with TB ~57 years ago. She noted not being able to see her mother for 6 months during this time and noted then my mother tried to take her (daughter) away from me. She noted being triggered by breathing exercises as a result. She requested that speaking about the breathing caused her distress and requested that we do not talk about it so I can calm down. We worked on teacher, adult education. Therapist modeled this during the session and provided a handout and video via email for reference and reviewed. Therapist validated Marypat for her effort and disclosure during the session. Therapist encouraged the use of the grounding techniques between sessions and to document levels of distress between 1-10 before and after the tool.  Diane was engaged and was open to employing the grounding technique. Therapist provided supportive therapy, praised Roxsana for her disclosure, and a follow-up was scheduled for continued treatment.    Interventions: interpersonal and CBT  Diagnosis:   Generalized anxiety disorder  Depression, recurrent (HCC)  Psychiatric Treatment: Yes , via PCP. See chart.   Treatment Plan:  Client Abilities/Strengths Christinamarie is intelligent and motivated for change.   Support System: Dr. Thedora, Friends, Kids.   Client Treatment Preferences Outpatient patient.   Client Statement of Needs Makell would like to manage stress and distress, minimize negative self-talk, process past events including those affecting negative self-talk, psych-education regarding anxiety and depression, set and manage boundaries with  self and other, identifying areas of control and lack of control, managing anxiety and depression, differentiating between various types of stress, working towards autonomy, building a health support system, engage in consistent self-care, bolster coping skills, improve  self-esteem, being more mindful.    Treatment Level Weekly  Symptoms  Depression: loss of interest, feeling down, poor eating, feeling bad about self, trouble concentrating, psycho-motor retardation.  (Status: maintained) Anxiety: feeling anxious, difficulty managing worry, worrying too much about different things trouble relaxing, restlessness, irritability, feeling afraid something awful might happen.    (Status: maintained)  Goals:   Yannis experiences symptoms of depression and anxiety.  Treatment plan signed and available on s-drive:  Yes    Target Date: 07/16/24 Frequency: Weekly  Progress: 0 Modality: individual    Therapist will provide referrals for additional resources as appropriate.  Therapist will provide psycho-education regarding Amantha's diagnosis and corresponding treatment approaches and interventions. Licensed Clinical Social Worker, [place], LCSW will support the patient's ability to achieve the goals identified. will employ CBT, BA, Problem-solving, Solution Focused, Mindfulness,  coping skills, & other evidenced-based practices will be used to promote progress towards healthy functioning to help manage decrease symptoms associated with her diagnosis.   Reduce overall level, frequency, and intensity of the feelings of depression, anxiety and panic evidenced by decreased overall symptoms from 6 to 7 days/week to 0 to 1 days/week per client report for at least 3 consecutive months. Verbally express understanding of the relationship between feelings of depression, anxiety and their impact on thinking patterns and behaviors. Verbalize an understanding of the role that distorted thinking plays in creating fears, excessive worry, and ruminations.  (Alfreida participated in the creation of the treatment plan)   Elvie Mullet, LCSW

## 2023-12-03 ENCOUNTER — Ambulatory Visit: Payer: Medicare HMO | Admitting: Psychology

## 2023-12-14 DIAGNOSIS — Z96651 Presence of right artificial knee joint: Secondary | ICD-10-CM | POA: Diagnosis not present

## 2023-12-14 DIAGNOSIS — Z471 Aftercare following joint replacement surgery: Secondary | ICD-10-CM | POA: Diagnosis not present

## 2023-12-17 ENCOUNTER — Ambulatory Visit: Payer: Medicare HMO | Admitting: Psychology

## 2023-12-17 DIAGNOSIS — F411 Generalized anxiety disorder: Secondary | ICD-10-CM

## 2023-12-17 DIAGNOSIS — F339 Major depressive disorder, recurrent, unspecified: Secondary | ICD-10-CM

## 2023-12-17 NOTE — Progress Notes (Signed)
 [facility] Counselor/Therapist Progress Note  Patient ID: Lisa Oconnell, MRN: [id number]    Date: 12/17/23  Time Spent: 1:32 pm -  2:27 pm : 55  Minutes  Treatment Type: Individual Therapy.  Reported Symptoms: depression and anxiety.   Mental Status Exam: Appearance:  Neat and Well Groomed     Behavior: Appropriate  Motor: Normal  Speech/Language:  Clear and Coherent  Affect: Congruent   Mood: dysthymic  Thought process: normal  Thought content:   WNL  Sensory/Perceptual disturbances:   WNL  Orientation: oriented to person, place, time/date, and situation  Attention: Good  Concentration: Good  Memory: WNL  Fund of knowledge:  Good  Insight:   Good  Judgment:  Good  Impulse Control: Good   Risk Assessment: Danger to Self:  No Self-injurious Behavior: No Danger to Others: No Duty to Warn:no Physical Aggression / Violence:No  Access to Firearms a concern: No  Gang Involvement:No   In case of a mental health emergency:  38 - confidential suicide hotline. Visiting Behavioral Health Urgent Care [facility]):        [address], [place] [address]       [phone] 3.   911  4.   Visiting Nearest ED.    Subjective:   Osborne Blazer participated in the session, in person in the office with the therapist, and consented to treatment Elantra reviewed the events of the past week. Frankye noted  some improvement in her daughter's treatment of her but could not identify a cause for this improvement. Helane noted working on maintaining her boundaries despite a history of her daughter often being upset by hearing "no" and often ignoring Aliveah when upset. We discussed the importance of consistency in regards to boundaries. She noted being more mindful of her self-talk and telling herself "to stop that". Therapist praised Vonna for her efforts and we worked on identify ways to challenge this. She noted stress with her soon-to-be ex husband. We worked on  exploring this during the session. She provided additional history regarding their relationship and their separation. She noted the difficulty with the separation due to unreasonable demands and expectations. She noted financial stressors due to a lack of transparency regarding finances and due to unilateral financial decision making. She noted her niece purchasing her home and allowing Cleotha to live there. She noted trepidation to communicate a need for repairs despite an agreement to do so during the purchase of the house. She noted "always" being tentative to give feedback and set boundaries. We worked on exploring this during the session and the contributing factors to this including growing up in a home that was unsupportive and did not provide much empathy and understanding. We worked on reframing her experience and attempting to look at her experience more adaptively. Therapist challenged Audray's negative self-talk and work on modeling putting behavior into the context of the situation. Therapist encouraged Natilee to work on being mindful of her self talk, challenging this, and working on adopting a more adaptive perspective. Shreya was engaged and motivated during the session. She expressed commitment towards goals. Therapist praised Jaquana and provided supportive therapy.    Interventions: interpersonal and CBT  Diagnosis:   Generalized anxiety disorder  Depression, recurrent (HCC)  Psychiatric Treatment: Yes , via PCP. See chart.   Treatment Plan:  Client Abilities/Strengths Sheyla is intelligent and motivated for change.   Support System: Dr. Therese Flash, Friends, Kids.   Client Treatment Preferences Outpatient patient.  Client Statement of Needs Mixtli would like to manage stress and distress, minimize negative self-talk, process past events including those affecting negative self-talk, psych-education regarding anxiety and depression, set and manage boundaries with self and other,  identifying areas of control and lack of control, managing anxiety and depression, differentiating between various types of stress, working towards autonomy, building a health support system, engage in consistent self-care, bolster coping skills, improve self-esteem, being more mindful.    Treatment Level Weekly  Symptoms  Depression: loss of interest, feeling down, poor eating, feeling bad about self, trouble concentrating, psycho-motor retardation.  (Status: maintained) Anxiety: feeling anxious, difficulty managing worry, worrying too much about different things trouble relaxing, restlessness, irritability, feeling afraid something awful might happen.    (Status: maintained)  Goals:   Lachlyn experiences symptoms of depression and anxiety.  Treatment plan signed and available on s-drive:  Yes    Target Date: 07/16/24 Frequency: Weekly  Progress: 0 Modality: individual    Therapist will provide referrals for additional resources as appropriate.  Therapist will provide psycho-education regarding Sherryl's diagnosis and corresponding treatment approaches and interventions. Licensed Clinical Social Worker, [place], LCSW will support the patient's ability to achieve the goals identified. will employ CBT, BA, Problem-solving, Solution Focused, Mindfulness,  coping skills, & other evidenced-based practices will be used to promote progress towards healthy functioning to help manage decrease symptoms associated with her diagnosis.   Reduce overall level, frequency, and intensity of the feelings of depression, anxiety and panic evidenced by decreased overall symptoms from 6 to 7 days/week to 0 to 1 days/week per client report for at least 3 consecutive months. Verbally express understanding of the relationship between feelings of depression, anxiety and their impact on thinking patterns and behaviors. Verbalize an understanding of the role that distorted thinking plays in creating fears,  excessive worry, and ruminations.  (Makenzie participated in the creation of the treatment plan)   Belva Boyden, LCSW

## 2023-12-18 ENCOUNTER — Encounter: Payer: Self-pay | Admitting: Family Medicine

## 2023-12-18 ENCOUNTER — Ambulatory Visit (INDEPENDENT_AMBULATORY_CARE_PROVIDER_SITE_OTHER): Payer: Medicare HMO | Admitting: Family Medicine

## 2023-12-18 ENCOUNTER — Telehealth: Payer: Self-pay | Admitting: Family Medicine

## 2023-12-18 ENCOUNTER — Other Ambulatory Visit: Payer: Self-pay

## 2023-12-18 VITALS — BP 120/66 | HR 52 | Temp 97.9°F | Ht 62.0 in | Wt 105.2 lb

## 2023-12-18 DIAGNOSIS — F411 Generalized anxiety disorder: Secondary | ICD-10-CM

## 2023-12-18 DIAGNOSIS — E782 Mixed hyperlipidemia: Secondary | ICD-10-CM | POA: Diagnosis not present

## 2023-12-18 DIAGNOSIS — F339 Major depressive disorder, recurrent, unspecified: Secondary | ICD-10-CM

## 2023-12-18 DIAGNOSIS — R413 Other amnesia: Secondary | ICD-10-CM

## 2023-12-18 DIAGNOSIS — R0600 Dyspnea, unspecified: Secondary | ICD-10-CM

## 2023-12-18 DIAGNOSIS — Z0000 Encounter for general adult medical examination without abnormal findings: Secondary | ICD-10-CM | POA: Diagnosis not present

## 2023-12-18 DIAGNOSIS — H9190 Unspecified hearing loss, unspecified ear: Secondary | ICD-10-CM

## 2023-12-18 DIAGNOSIS — H9313 Tinnitus, bilateral: Secondary | ICD-10-CM

## 2023-12-18 LAB — BASIC METABOLIC PANEL
BUN: 15 mg/dL (ref 6–23)
CO2: 28 meq/L (ref 19–32)
Calcium: 8.9 mg/dL (ref 8.4–10.5)
Chloride: 103 meq/L (ref 96–112)
Creatinine, Ser: 0.73 mg/dL (ref 0.40–1.20)
GFR: 77.16 mL/min (ref >60.00)
Glucose, Bld: 81 mg/dL (ref 70–99)
Potassium: 4.3 meq/L (ref 3.5–5.1)
Sodium: 138 meq/L (ref 135–145)

## 2023-12-18 LAB — BRAIN NATRIURETIC PEPTIDE: Pro B Natriuretic peptide (BNP): 174 pg/mL — ABNORMAL HIGH (ref 0.0–100.0)

## 2023-12-18 LAB — LIPID PANEL
Cholesterol: 250 mg/dL — ABNORMAL HIGH (ref 0–200)
HDL: 76.1 mg/dL (ref >39.00)
LDL Cholesterol: 152 mg/dL — ABNORMAL HIGH (ref 0–99)
NonHDL: 173.61
Total CHOL/HDL Ratio: 3
Triglycerides: 107 mg/dL (ref 0.0–149.0)
VLDL: 21.4 mg/dL (ref 0.0–40.0)

## 2023-12-18 MED ORDER — FLUOXETINE HCL 40 MG PO CAPS: 40.0000 mg | ORAL_CAPSULE | Freq: Every day | ORAL | 3 refills | Status: DC

## 2023-12-18 MED ORDER — FLUOXETINE HCL 40 MG PO CAPS: 40.0000 mg | ORAL_CAPSULE | Freq: Every day | ORAL | 3 refills | Status: AC

## 2023-12-18 MED ORDER — FLUOXETINE HCL 20 MG PO CAPS: 20.0000 mg | ORAL_CAPSULE | Freq: Every day | ORAL | 3 refills | Status: DC

## 2023-12-18 MED ORDER — ALPRAZOLAM 0.5 MG PO TABS: 0.5000 mg | ORAL_TABLET | Freq: Two times a day (BID) | ORAL | 2 refills | Status: DC

## 2023-12-18 NOTE — Progress Notes (Signed)
[facility] Trecia Rogers [facility] [address] [place] [address] Dept: [phone] Dept Fax: [phone]  Annual Physical Visit  Subjective:    Patient ID: Lisa Oconnell, female    DOB: 1941-06-05, 83 y.o..   MRN: [id number]  Chief Complaint  Patient presents with   Annual Exam    CPE/labs.  Fasting today.     History of Present Illness:  Patient is in today for an annual physical/preventative visit.  Lisa Oconnell has a history of depression and anxiety. Over the past year, much of this has been triggered by marital stress that has eventually ended in separation and nearing divorce. She feels she is doing better overall. Her relationship with her daughter is improving. She has some friends for support. She is currently on fluoxetine 60 mg daily and uses alprazolam 0.5 mg twice a day. She rarely takes a half tablet for anxiety attacks (once or twice a month).   Review of Systems  Constitutional:  Negative for chills, diaphoresis, fever, malaise/fatigue and weight loss.  HENT:  Positive for hearing loss and tinnitus. Negative for congestion, ear pain, sinus pain and sore throat.        Has a chronic history with tinnitus, which has been difficult to ignore. she finds the alprazolam at bedtime does help her get to sleep despite this. She feels she may have some hearing difficulty. She notes her family and friends feel she needs hearing aids.  Eyes:  Negative for blurred vision, pain, discharge and redness.  Respiratory:  Positive for shortness of breath. Negative for cough and wheezing.        Notes some dyspnea, at times with exertion, but also linked to her anxiety. She has no history of tobacco use. She does not wheeze. She has had no lower leg edema.  Cardiovascular:  Positive for chest pain. Negative for palpitations.       Notes periodic chest pressure. She relieves this by breathing more deeply.  This is not necessarily associated with exertion.   Gastrointestinal:  Positive for diarrhea. Negative for abdominal pain, constipation, heartburn, nausea and vomiting.       Experiences some chornic GI distress with occasional diarrhea.  Musculoskeletal:  Negative for back pain, joint pain and myalgias.  Skin:  Negative for itching and rash.  Psychiatric/Behavioral:  Positive for depression and memory loss. The patient is nervous/anxious.        As above.  In June, Lisa Oconnell had raised a concern about potential dementia. Her MMSE was 26. She does not feel that her memory is any worse.    Past Medical History: Patient Active Problem List   Diagnosis Date Noted   Seborrheic keratoses 05/02/2023   Melanocytic nevi of trunk 05/02/2023   Lipoma of back 05/02/2023   History of dysplastic nevus 05/02/2023   Memory difficulty 05/02/2023   Auditory hallucinations 05/02/2023   Tinnitus aurium, bilateral 11/21/2022   Depression, recurrent (HCC) 02/27/2022   Generalized anxiety disorder 02/27/2022   Insomnia 02/27/2022   History of tuberculosis 02/27/2022   History of colon polyps 02/27/2022   Hyperlipidemia 02/27/2022   Osteopenia 02/27/2022   Lichen simplex chronicus 02/27/2022   Sebopsoriasis 02/26/2022   Sacroiliac joint pain 02/10/2022   Lumbar spondylosis 01/16/2022   HNP (herniated nucleus pulposus), lumbar 02/17/2021   Neurogenic claudication 01/25/2021   Degenerative lumbar spinal stenosis 01/25/2021   History of prosthetic unicompartmental arthroplasty of right knee 04/01/2020   OA (osteoarthritis) of knee 03/28/2017   Acute  medial meniscal tear 04/11/2016   Past Surgical History:  Procedure Laterality Date   BREAST EXCISIONAL BIOPSY Right    benign/ 83 years old   BUNIONECTOMY     left   CATARACT EXTRACTION W/ INTRAOCULAR LENS  IMPLANT, BILATERAL  2013   cool sculpting     both arms and back   DILATION AND CURETTAGE OF UTERUS     EYE SURGERY     HAND CONTRACTURE RELEASE     trigger finger   KNEE ARTHROSCOPY WITH  MEDIAL MENISECTOMY Right 04/11/2016   Procedure: KNEE ARTHROSCOPY WITH MEDIAL MENISECTOMY AND CONDROPLASTY;  Surgeon: Ollen Gross, MD;  Location: [facility];  Service: Orthopedics;  Laterality: Right;   liposuction (15 yrs ago     stomach   LUMBAR LAMINECTOMY/DECOMPRESSION MICRODISCECTOMY N/A 02/17/2021   Procedure: Microlumbar Decompression Lumbar one-two, Lumbar two-three, Lumbar three-four;  Surgeon: Jene Every, MD;  Location: [facility];  Service: Orthopedics;  Laterality: N/A;   PARTIAL KNEE ARTHROPLASTY Right 03/28/2017   Procedure: UNICOMPARTMENTAL RIGHT  KNEE;  Surgeon: Ollen Gross, MD;  Location: [facility];  Service: Orthopedics;  Laterality: Right;  Adductor Block   TUBAL LIGATION  1978   Family History  Problem Relation Age of Onset   Alzheimer's disease Mother    Heart disease Father    Heart attack Father    Diabetes Sister    Diabetes Brother    Cancer Daughter        Non-Hodgkins Lymphoma   Diabetes Paternal Aunt    Cancer Grandson        Leukemia   Diabetes Grandson        Type 1   Epilepsy Grandson    Breast cancer Neg Hx    Outpatient Medications Prior to Visit  Medication Sig Dispense Refill   acetaminophen (TYLENOL) 500 MG tablet Take 1,000 mg by mouth at bedtime.     ALPRAZolam (XANAX) 0.5 MG tablet Take 1 tablet (0.5 mg total) by mouth at bedtime as needed for anxiety or sleep. 60 tablet 2   FLUoxetine (PROZAC) 20 MG capsule Take 1 capsule (20 mg total) by mouth daily. Take together with 40 mg capsule (total daily dose of 60 mg daily). 90 capsule 3   FLUoxetine (PROZAC) 40 MG capsule Take 1 capsule (40 mg total) by mouth daily. Take together with 20 mg tablet (total daily dose of 60 mg a day) 90 capsule 3   No facility-administered medications prior to visit.   Allergies  Allergen Reactions   Sulfa Antibiotics Swelling    Mouth blistered,    Ambien [Zolpidem] Other (See Comments)   Objective:   Today's Vitals   12/18/23 0954  BP: 120/66  Temp: 97.9 F  (36.6 C)  TempSrc: Temporal  Weight: 105 lb 3.2 oz (47.7 kg)  Height: 5\' 2"  (1.575 m)   Body mass index is 19.24 kg/m.   General: Well developed, well nourished. No acute distress. HEENT: Normocephalic, non-traumatic. PERRL, EOMI. Conjunctiva clear. External ears normal. EAC and TMs   normal bilaterally. Nose clear without congestion or rhinorrhea. Mucous membranes moist. Oropharynx clear.   Fair dentition. Neck: Supple. No lymphadenopathy. No thyromegaly. Lungs: Clear to auscultation bilaterally. No wheezing, rales or rhonchi. CV: RRR without murmurs or rubs. Pulses 2+ bilaterally. Abdomen: Soft, non-tender. Bowel sounds positive, normal pitch and frequency. No hepatosplenomegaly. No   rebound or guarding. Extremities: Full ROM. No joint swelling or tenderness. No edema noted. Skin: Warm and dry. No rashes. Psych: Alert and oriented. Normal mood  and affect.  There are no preventive care reminders to display for this patient.     12/18/2023   11:29 AM 05/02/2023    4:31 PM  MMSE - Mini Mental State Exam  Orientation to time 4 3  Orientation to Place 5 5  Registration 3 3  Attention/ Calculation 5 4  Recall 2 2  Language- name 2 objects 2 2  Language- repeat 1 1  Language- follow 3 step command 3 3  Language- read & follow direction 1 1  Write a sentence 1 1  Copy design 1 1  Total score 28 26      Assessment & Plan:   Problem List Items Addressed This Visit       Other   Depression, recurrent (HCC)   Ms. Hartzog's mood is much improved. Continue counseling. Continue fluoxetine 60 mg daily.      Relevant Medications   ALPRAZolam (XANAX) 0.5 MG tablet   FLUoxetine (PROZAC) 20 MG capsule   FLUoxetine (PROZAC) 40 MG capsule   Generalized anxiety disorder   Stable/improved. Some of her physical symptoms, such as chest pain, dyspnea, and diarrhea, appear to be anxiety related. Continue alprazolam 0.5 mg 2 tabs daily, with occasional 1 tab of rescue dose.       Relevant Medications   ALPRAZolam (XANAX) 0.5 MG tablet   FLUoxetine (PROZAC) 20 MG capsule   FLUoxetine (PROZAC) 40 MG capsule   Hyperlipidemia   I will check her lipids today.      Relevant Orders   Lipid panel   Memory difficulty   Ms. Balthaser's MMSE is consistent with normal memory issues of aging. I believe her depression and anxiety had exacerbated some of this last summer. I reassured her that she is not showing signs of Alzheimer's disease.      Tinnitus aurium, bilateral   Discussed approaches to distract herself form her tinnitus. Will have hearing assessed.      Relevant Orders   Ambulatory referral to Audiology   Other Visit Diagnoses       Annual physical exam    -  Primary   Overall good health. Encourage her to do regular walks. Discussed potential screenings and immunizations.     Hearing difficulty, unspecified laterality       I will refer her to audiology for an assessment.   Relevant Orders   Ambulatory referral to Audiology     Dyspnea, unspecified type       Etiology is uncertain. This likely represents anxiety. I will check a BNP to assess for potential HF, thoguh she has no other physcial signs of this.   Relevant Orders   Basic metabolic panel   Brain natriuretic peptide       Return in about 3 months (around 03/16/2024) for Reassessment.   Loyola Mast, MD

## 2023-12-18 NOTE — Telephone Encounter (Signed)
[facility] is needing is know the correct day supply for patient medication the ALPRAZolam Prudy Feeler) 0.5 MG tablet  the pharmacy is waiting on a call to see the correct day supply that is needed to be prescribed to patient

## 2023-12-18 NOTE — Assessment & Plan Note (Signed)
Ms. Chavers mood is much improved. Continue counseling. Continue fluoxetine 60 mg daily.

## 2023-12-18 NOTE — Assessment & Plan Note (Signed)
Discussed approaches to distract herself form her tinnitus. Will have hearing assessed.

## 2023-12-18 NOTE — Telephone Encounter (Signed)
Called pharmacy and d/c medication due to patient wants it sent to [facility]. Marland Kitchendmc

## 2023-12-18 NOTE — Telephone Encounter (Signed)
Spoke tp pharmacist [facility] she states that on 10/13/23 and 11/19/23 she received a #60 Alprazolam 0.5 from Dr Martha Clan.    They canceled today's RX's due to wanted  sent to [facility].    Please review and advise. Thanks Dm/cma

## 2023-12-18 NOTE — Telephone Encounter (Signed)
Copied from CRM [id number]. Topic: Clinical - Medication Question >> Dec 18, 2023  1:18 PM Almira Coaster wrote: Reason for CRM: Patient was seen at the office today and ALPRAZolam Prudy Feeler) 0.5 MG tablet & FLUoxetine (PROZAC) 20 MG capsule, were sent to the [facility], Patient is asking we send to  [facility] [address] - [place], [place] - [address] [place]. AT [facility] [address] [phone] [address] [facility] [place] [address].   Please review and advise.  Thanks. Dm/cma

## 2023-12-18 NOTE — Telephone Encounter (Signed)
Pt is wanting a cb, she is very upset. E2c2 agent said she was crying, please advise. She feels this is all a big misunderstanding.

## 2023-12-18 NOTE — Telephone Encounter (Signed)
Patient notified VIA Phone. Dm/cma

## 2023-12-18 NOTE — Assessment & Plan Note (Addendum)
Stable/improved. Some of her physical symptoms, such as chest pain, dyspnea, and diarrhea, appear to be anxiety related. Continue alprazolam 0.5 mg 2 tabs daily, with occasional 1 tab of rescue dose.

## 2023-12-18 NOTE — Assessment & Plan Note (Signed)
Ms. Hollander MMSE is consistent with normal memory issues of aging. I believe her depression and anxiety had exacerbated some of this last summer. I reassured her that she is not showing signs of Alzheimer's disease.

## 2023-12-18 NOTE — Telephone Encounter (Signed)
Spoke to patient and advised her that she just needs to make sure that she is only getting her Alprazolam from Dr Veto Kemps.    She says it is a mistake and she doesn't even see Dr Clelia Croft.  Called [facility] and they canceled an Rx from Dr Clelia Croft for me.  Dm/cma

## 2023-12-18 NOTE — Assessment & Plan Note (Signed)
I will check her lipids today

## 2023-12-31 ENCOUNTER — Ambulatory Visit: Payer: Medicare HMO | Admitting: Psychology

## 2023-12-31 DIAGNOSIS — F411 Generalized anxiety disorder: Secondary | ICD-10-CM | POA: Diagnosis not present

## 2023-12-31 DIAGNOSIS — F339 Major depressive disorder, recurrent, unspecified: Secondary | ICD-10-CM | POA: Diagnosis not present

## 2023-12-31 NOTE — Progress Notes (Signed)
 [facility] Counselor/Therapist Progress Note  Patient ID: CHIA MOWERS, MRN: [id number]    Date: 12/31/23  Time Spent: 12:33 pm -  1:33 pm  60 Minutes  Treatment Type: Individual Therapy.  Reported Symptoms: depression and anxiety.   Mental Status Exam: Appearance:  Neat and Well Groomed     Behavior: Appropriate  Motor: Normal  Speech/Language:  Clear and Coherent  Affect: Congruent   Mood: dysthymic  Thought process: normal  Thought content:   WNL  Sensory/Perceptual disturbances:   WNL  Orientation: oriented to person, place, time/date, and situation  Attention: Good  Concentration: Good  Memory: WNL  Fund of knowledge:  Good  Insight:   Good  Judgment:  Good  Impulse Control: Good   Risk Assessment: Danger to Self:  No Self-injurious Behavior: No Danger to Others: No Duty to Warn:no Physical Aggression / Violence:No  Access to Firearms a concern: No  Gang Involvement:No   In case of a mental health emergency:  69 - confidential suicide hotline. [facility]):        [address], [place] [address]       [phone] 3.   911  4.   Visiting Nearest ED.    Subjective:   [name] participated in the session, in person in the office with the therapist, and consented to treatment Keyira reviewed the events of the past week. Salam noted "nothing going well". She noted losing her car keeps, running out of gas, and her grandson's upcoming brain surgery. Doratha noted her husband had passed due to brain tumors and discussed her family's various health issues. She had difficulty identifying positives during the session. We worked on processing this and identifying positives. Lasheka noted being "tired of not being able to do anything". We worked on exploring this during the session and therapist highlighted Jennel's avoidance and the cost of this avoidance to her mood. Therapist provided  psycho-education regarding avoidance and the increase in psychological and physiological tension. We worked on processing this during the session. She noted being in a marriage where her husband was in charge of most things and now feeling pressure to be responsible for all things since their separation. Therapist highlighted negative self-talk, generalizing, and catastrophizing during the session. Jood was participatory but resistant during the session. She was open to change towards the latter part of the session. Therapist provided psycho-education regarding negative self-talk regarding mood, outlook, and functioning. Therapist praised Kira during the session for attempting to be more adaptive and flexible and being open to change. Therapist provided handout to challenge and rephrase negative self-talk. Therapist modeled challenging this during the session. Therapist provided supportive therapy during the session.   Interventions: interpersonal and CBT  Diagnosis:   Generalized anxiety disorder  Depression, recurrent (HCC)  Psychiatric Treatment: Yes , via PCP. See chart.   Treatment Plan:  Client Abilities/Strengths Montanna is intelligent and motivated for change.   Support System: Dr. Veto Kemps, [employer] [employer].   Client Treatment Preferences Outpatient patient.   Client Statement of Needs Vedanshi would like to manage stress and distress, minimize negative self-talk, process past events including those affecting negative self-talk, psych-education regarding anxiety and depression, set and manage boundaries with self and other, identifying areas of control and lack of control, managing anxiety and depression, differentiating between various types of stress, working towards autonomy, building a health support system, engage in consistent self-care, bolster coping skills, improve self-esteem, being more mindful.  Treatment Level Weekly  Symptoms  Depression: loss of interest, feeling  down, poor eating, feeling bad about self, trouble concentrating, psycho-motor retardation.  (Status: maintained) Anxiety: feeling anxious, difficulty managing worry, worrying too much about different things trouble relaxing, restlessness, irritability, feeling afraid something awful might happen.    (Status: maintained)  Goals:   Nyeisha experiences symptoms of depression and anxiety.  Treatment plan signed and available on s-drive:  Yes    Target Date: 07/16/24 Frequency: Weekly  Progress: 0 Modality: individual    Therapist will provide referrals for additional resources as appropriate.  Therapist will provide psycho-education regarding Neka's diagnosis and corresponding treatment approaches and interventions. Licensed Clinical Social Worker, Ina, LCSW will support the patient's ability to achieve the goals identified. will employ CBT, BA, Problem-solving, Solution Focused, Mindfulness,  coping skills, & other evidenced-based practices will be used to promote progress towards healthy functioning to help manage decrease symptoms associated with her diagnosis.   Reduce overall level, frequency, and intensity of the feelings of depression, anxiety and panic evidenced by decreased overall symptoms from 6 to 7 days/week to 0 to 1 days/week per client report for at least 3 consecutive months. Verbally express understanding of the relationship between feelings of depression, anxiety and their impact on thinking patterns and behaviors. Verbalize an understanding of the role that distorted thinking plays in creating fears, excessive worry, and ruminations.  (Alianah participated in the creation of the treatment plan)   [name], LCSW

## 2024-01-06 DIAGNOSIS — M25561 Pain in right knee: Secondary | ICD-10-CM | POA: Diagnosis not present

## 2024-01-14 ENCOUNTER — Ambulatory Visit: Payer: Medicare HMO | Admitting: Psychology

## 2024-01-14 DIAGNOSIS — F339 Major depressive disorder, recurrent, unspecified: Secondary | ICD-10-CM

## 2024-01-14 DIAGNOSIS — F411 Generalized anxiety disorder: Secondary | ICD-10-CM | POA: Diagnosis not present

## 2024-01-14 NOTE — Progress Notes (Signed)
 [facility] Counselor/Therapist Progress Note  Patient ID: SUZANN LAZARO, MRN: [id number]    Date: 01/14/24  Time Spent: 10:01 am - 10:45  am  44 Minutes  Treatment Type: Individual Therapy.  Reported Symptoms: depression and anxiety.   Mental Status Exam: Appearance:  Neat and Well Groomed     Behavior: Appropriate  Motor: Normal  Speech/Language:  Clear and Coherent  Affect: Congruent   Mood: normal  Thought process: normal  Thought content:   WNL  Sensory/Perceptual disturbances:   WNL  Orientation: oriented to person, place, time/date, and situation  Attention: Good  Concentration: Good  Memory: WNL  Fund of knowledge:  Good  Insight:   Good  Judgment:  Good  Impulse Control: Good   Risk Assessment: Danger to Self:  No Self-injurious Behavior: No Danger to Others: No Duty to Warn:no Physical Aggression / Violence:No  Access to Firearms a concern: No  Gang Involvement:No   In case of a mental health emergency:  36 - confidential suicide hotline. [facility]):        [address], [place] [address]       [phone] 3.   911  4.   Visiting Nearest ED.    Subjective:   [name] participated in the session, in person in the office with the therapist, and consented to treatment Ahlia reviewed the events of the past week. Diane noted feeling "very good today". We worked on exploring this during the session. She noted a recent discussion with her soon to be ex-husband. We worked on processing this during the session. She noted that it feels like "its almost the end of all this mess". She noted receiving "nasty" letters, from her step-children, after the separation. We processed this during the session. She noted some stressful interactions with her daughter. We worked on exploring this during the session and therapist highlighted her daughter's displacement of frustration towards Diane. Therapist  praised Alannie for her effort tin this are and encouraged her to continue her effort to identify positives and highlight areas of gratitude on a daily basis. Therapist modeled this during the session. She noted eating 3 meals a day, cooking, and keeping her house clean. She is working in the year, taking care of her dogs, and paying bills. Therapist praised Diane for her effort in this area. She noted a need to be more versed in technology and how this would be helpful to manage her own household and gain some autonomy from her daughters. We explored this and identified possible resources in this area. Camille was engaged and motivated during the session. She expressed commitment towards goals. Therapist praised Mahdiya for her effort and provided supportive therapy. A follow-up was scheduled for continued treatment.   Interventions: interpersonal and CBT  Diagnosis:   Generalized anxiety disorder  Depression, recurrent (HCC)  Psychiatric Treatment: Yes , via PCP. See chart.   Treatment Plan:  Client Abilities/Strengths Louvina is intelligent and motivated for change.   Support System: Dr. Veto Kemps, [employer] [employer].   Client Treatment Preferences Outpatient patient.   Client Statement of Needs Jeanni would like to manage stress and distress, minimize negative self-talk, process past events including those affecting negative self-talk, psych-education regarding anxiety and depression, set and manage boundaries with self and other, identifying areas of control and lack of control, managing anxiety and depression, differentiating between various types of stress, working towards autonomy, building a health support system, engage in consistent self-care,  bolster coping skills, improve self-esteem, being more mindful.    Treatment Level Weekly  Symptoms  Depression: loss of interest, feeling down, poor eating, feeling bad about self, trouble concentrating, psycho-motor retardation.  (Status:  maintained) Anxiety: feeling anxious, difficulty managing worry, worrying too much about different things trouble relaxing, restlessness, irritability, feeling afraid something awful might happen.    (Status: maintained)  Goals:   Chauna experiences symptoms of depression and anxiety.  Treatment plan signed and available on s-drive:  Yes    Target Date: 07/16/24 Frequency: Weekly  Progress: 0 Modality: individual    Therapist will provide referrals for additional resources as appropriate.  Therapist will provide psycho-education regarding Jatziry's diagnosis and corresponding treatment approaches and interventions. Licensed Clinical Social Worker, [place], LCSW will support the patient's ability to achieve the goals identified. will employ CBT, BA, Problem-solving, Solution Focused, Mindfulness,  coping skills, & other evidenced-based practices will be used to promote progress towards healthy functioning to help manage decrease symptoms associated with her diagnosis.   Reduce overall level, frequency, and intensity of the feelings of depression, anxiety and panic evidenced by decreased overall symptoms from 6 to 7 days/week to 0 to 1 days/week per client report for at least 3 consecutive months. Verbally express understanding of the relationship between feelings of depression, anxiety and their impact on thinking patterns and behaviors. Verbalize an understanding of the role that distorted thinking plays in creating fears, excessive worry, and ruminations.  (Mili participated in the creation of the treatment plan)   [name], LCSW

## 2024-01-16 DIAGNOSIS — M1711 Unilateral primary osteoarthritis, right knee: Secondary | ICD-10-CM | POA: Diagnosis not present

## 2024-01-28 ENCOUNTER — Ambulatory Visit: Admitting: Psychology

## 2024-01-28 DIAGNOSIS — F411 Generalized anxiety disorder: Secondary | ICD-10-CM

## 2024-01-28 DIAGNOSIS — F339 Major depressive disorder, recurrent, unspecified: Secondary | ICD-10-CM | POA: Diagnosis not present

## 2024-01-28 NOTE — Progress Notes (Signed)
 [facility] Counselor/Therapist Progress Note  Patient ID: Lisa Oconnell, MRN: [id number]    Date: 01/28/24  Time Spent: 12:29 pm - 1:29  pm  59 Minutes  Treatment Type: Individual Therapy.  Reported Symptoms: depression and anxiety.   Mental Status Exam: Appearance:  Neat and Well Groomed     Behavior: Appropriate  Motor: Normal  Speech/Language:  Clear and Coherent  Affect: Congruent   Mood: dysthymic  Thought process: normal  Thought content:   WNL  Sensory/Perceptual disturbances:   WNL  Orientation: oriented to person, place, time/date, and situation  Attention: Good  Concentration: Good  Memory: WNL  Fund of knowledge:  Good  Insight:   Fair  Judgment:  Good  Impulse Control: Good   Risk Assessment: Danger to Self:  No Self-injurious Behavior: No Danger to Others: No Duty to Warn:no Physical Aggression / Violence:No  Access to Firearms a concern: No  Gang Involvement:No   In case of a mental health emergency:  988 - confidential suicide hotline. [facility]):        [address], [place] [address]       [phone] 3.   911  4.   Visiting Nearest ED.    Subjective:   Lisa Oconnell participated in the session, in person in the office with the therapist, and consented to treatment Lisa Oconnell reviewed the events of the past week. Lisa Oconnell noted this week being "a week from hell". She noted misplacing a $100 bill and not knowing where it was. She noted her grandson staying with her and drinking shortly after his surgery. She noted frustration regarding regarding the situation, as a whole, and noted that her house "is not my house". She noted that her rules are not being respected and that she worries about giving feedback due to the possible reaction to this. She noted that making a request such as "please use a coaster" could result in her daughter ignoring her for an extended period of time. She noted  having a great fear that this will occur, like it has in the past. We worked on exploring this during the session and the effect of her daughter's behavior on her. She noted having difficulty not ruminating when her daughter gives negative feedback. She noted that she takes this negative feedback "personally". We worked on exploring this during the session. Therapist validated Lisa Oconnell's need for autonomy and say in her day-to-day life. We will work on managing her rumination going forward and how to reduce personalizing people's feedback which she noted can often affirm the negative feelings and thoughts she has about herself. Lisa Oconnell was engaged and motivated during the session. She expressed commitment towards this goal. Therapist provided supportive therapy and a follow-up was scheduled for continued treatment.    Interventions: interpersonal and CBT  Diagnosis:   Generalized anxiety disorder  Depression, recurrent (HCC)  Psychiatric Treatment: Yes , via PCP. See chart.   Treatment Plan:  [name] is intelligent and motivated for change.   Support System: Dr. Veto Kemps, [employer] [employer].   Client Treatment Preferences Outpatient patient.   Client Statement of Needs Lisa Oconnell would like to manage stress and distress, minimize negative self-talk, process past events including those affecting negative self-talk, psych-education regarding anxiety and depression, set and manage boundaries with self and other, identifying areas of control and lack of control, managing anxiety and depression, differentiating between various types of stress, working towards autonomy, building a health support  system, engage in consistent self-care, bolster coping skills, improve self-esteem, being more mindful.    Treatment Level Weekly  Symptoms  Depression: loss of interest, feeling down, poor eating, feeling bad about self, trouble concentrating, psycho-motor retardation.  (Status:  maintained) Anxiety: feeling anxious, difficulty managing worry, worrying too much about different things trouble relaxing, restlessness, irritability, feeling afraid something awful might happen.    (Status: maintained)  Goals:   Lisa Oconnell experiences symptoms of depression and anxiety.  Treatment plan signed and available on s-drive:  Yes    Target Date: 07/16/24 Frequency: Weekly  Progress: 0 Modality: individual    Therapist will provide referrals for additional resources as appropriate.  Therapist will provide psycho-education regarding Lisa Oconnell's diagnosis and corresponding treatment approaches and interventions. Licensed Clinical Social Worker, [place], LCSW will support the patient's ability to achieve the goals identified. will employ CBT, BA, Problem-solving, Solution Focused, Mindfulness,  coping skills, & other evidenced-based practices will be used to promote progress towards healthy functioning to help manage decrease symptoms associated with her diagnosis.   Reduce overall level, frequency, and intensity of the feelings of depression, anxiety and panic evidenced by decreased overall symptoms from 6 to 7 days/week to 0 to 1 days/week per client report for at least 3 consecutive months. Verbally express understanding of the relationship between feelings of depression, anxiety and their impact on thinking patterns and behaviors. Verbalize an understanding of the role that distorted thinking plays in creating fears, excessive worry, and ruminations.  (Lisa Oconnell participated in the creation of the treatment plan)   [name], LCSW

## 2024-02-12 ENCOUNTER — Ambulatory Visit: Admitting: Psychology

## 2024-02-12 DIAGNOSIS — F339 Major depressive disorder, recurrent, unspecified: Secondary | ICD-10-CM | POA: Diagnosis not present

## 2024-02-12 DIAGNOSIS — F411 Generalized anxiety disorder: Secondary | ICD-10-CM | POA: Diagnosis not present

## 2024-02-12 NOTE — Progress Notes (Signed)
 Lisa Oconnell Counselor/Therapist Progress Note  Patient ID: Lisa Oconnell, MRN: [id number]    Date: 02/12/24  Time Spent: 2:04 pm - 3:03 pm: 59  Minutes  Treatment Type: Individual Therapy.  Reported Symptoms: depression and anxiety.   Mental Status Exam: Appearance:  Neat and Well Groomed     Behavior: Appropriate  Motor: Normal  Speech/Language:  Clear and Coherent  Affect: Congruent   Mood: dysthymic  Thought process: normal  Thought content:   WNL  Sensory/Perceptual disturbances:   WNL  Orientation: oriented to person, place, time/date, and situation  Attention: Good  Concentration: Good  Memory: WNL  Fund of knowledge:  Good  Insight:   Fair  Judgment:  Good  Impulse Control: Good   Risk Assessment: Danger to Self:  No Self-injurious Behavior: No Danger to Others: No Duty to Warn:no Physical Aggression / Violence:No  Access to Firearms a concern: No  Gang Involvement:No   In case of a mental Oconnell emergency:  988 - confidential suicide hotline. [facility]):        [address], [place] [address]       [phone] 3.   911  4.   Visiting Nearest ED.    Subjective:   Lisa Oconnell participated in the session, in person in the office with the therapist, and consented to treatment Lisa Oconnell reviewed the events of the past week. Lisa Oconnell noted feeling a lack of progress. She noted her attempt to present a gesture to both her children and noted her daughter youngest daughter inadvertently sharing this with her eldest daughter. Lisa Oconnell noted this resulted in her eldest daughter.becoming antagonistic and upset that she did not receive this gift in the manner and timeline she would Oconnell. She noted this causing her significant distress and her daughter ended up hanging up on her and not reaching out again. We worked on exploring this and Lisa Oconnell feelings of frustration. Lisa Oconnell noted that this behavior is  typical of her eldest daughter, who will react quite poorly when she does not get what she wants, isn't allowed to make decisions for others, or feels Oconnell she is not being treated fairly or justly. We worked on exploring these dynamics and Lisa Oconnell noted that her daughter will often punish her, by discontinuing contact, if she is upset. Lisa Oconnell noted lost patience with this dynamic and noted "refusing" to contact her daughter. We worked on exploring additional boundaries going forward and we will work on practicing this during the upcoming session. Therapist validated [name]'s feelings and experience, encouraged self-care and boundaries, and continued mindfulness of needs. Therapist provided supportive therapy and a follow-up was scheduled for continued treatment.   Interventions: interpersonal and CBT  Diagnosis:   Generalized anxiety disorder  Depression, recurrent (HCC)  Psychiatric Treatment: Yes , via PCP. See chart.   Treatment Plan:  [name] is intelligent and motivated for change.   Support System: Dr. Veto Kemps, [employer], [employer].   Client Treatment Preferences Outpatient patient.   Client Statement of Needs Lisa Oconnell to manage stress and distress, minimize negative self-talk, process past events including those affecting negative self-talk, psych-education regarding anxiety and depression, set and manage boundaries with self and other, identifying areas of control and lack of control, managing anxiety and depression, differentiating between various types of stress, working towards autonomy, building a Oconnell support system, engage in consistent self-care, bolster coping skills, improve self-esteem, being more mindful.    Treatment Level Weekly  Symptoms  Depression: loss of interest, feeling down, poor eating, feeling bad about self, trouble concentrating, psycho-motor retardation.  (Status: maintained) Anxiety: feeling anxious, difficulty managing  worry, worrying too much about different things trouble relaxing, restlessness, irritability, feeling afraid something awful might happen.    (Status: maintained)  Goals:   [name] experiences symptoms of depression and anxiety.  Treatment plan signed and available on s-drive:  Yes    Target Date: 07/16/24 Frequency: Weekly  Progress: 0 Modality: individual    Therapist will provide referrals for additional resources as appropriate.  Therapist will provide psycho-education regarding [name]'s diagnosis and corresponding treatment approaches and interventions. Licensed Clinical Social Worker, [place], LCSW will support the patient's ability to achieve the goals identified. will employ CBT, BA, Problem-solving, Solution Focused, Mindfulness,  coping skills, & other evidenced-based practices will be used to promote progress towards healthy functioning to help manage decrease symptoms associated with her diagnosis.   Reduce overall level, frequency, and intensity of the feelings of depression, anxiety and panic evidenced by decreased overall symptoms from 6 to 7 days/week to 0 to 1 days/week per client report for at least 3 consecutive months. Verbally express understanding of the relationship between feelings of depression, anxiety and their impact on thinking patterns and behaviors. Verbalize an understanding of the role that distorted thinking plays in creating fears, excessive worry, and ruminations.  ([name] participated in the creation of the treatment plan)   [name], LCSW

## 2024-03-04 ENCOUNTER — Ambulatory Visit (INDEPENDENT_AMBULATORY_CARE_PROVIDER_SITE_OTHER): Admitting: Psychology

## 2024-03-04 DIAGNOSIS — F339 Major depressive disorder, recurrent, unspecified: Secondary | ICD-10-CM

## 2024-03-04 DIAGNOSIS — F411 Generalized anxiety disorder: Secondary | ICD-10-CM

## 2024-03-04 NOTE — Progress Notes (Signed)
 [facility] Counselor/Therapist Progress Note  Patient ID: Lisa Oconnell, MRN: [id number]    Date: 03/04/24  Time Spent: 3:02 pm - 4:03 pm:  61  Minutes  Treatment Type: Individual Therapy.  Reported Symptoms: depression and anxiety.   Mental Status Exam: Appearance:  Neat and Well Groomed     Behavior: Appropriate  Motor: Normal  Speech/Language:  Clear and Coherent  Affect: Congruent   Mood: dysthymic  Thought process: normal  Thought content:   WNL  Sensory/Perceptual disturbances:   WNL  Orientation: oriented to person, place, time/date, and situation  Attention: Good  Concentration: Good  Memory: WNL  Fund of knowledge:  Good  Insight:   Fair  Judgment:  Good  Impulse Control: Good   Risk Assessment: Danger to Self:  No Self-injurious Behavior: No Danger to Others: No Duty to Warn:no Physical Aggression / Violence:No  Access to Firearms a concern: No  Gang Involvement:No   In case of a mental health emergency:  988 - confidential suicide hotline. [facility]):        [address], [place] [address]       [phone] 3.   911  4.   Visiting Nearest ED.    Subjective:   Lisa Oconnell participated in the session, in person in the office with the therapist, and consented to treatment Lisa Oconnell reviewed the events of the past week. She noted not much positive happening as of recent. We worked on identifying positives, which has been difficult for her.  We identified various positives including her recent boundary setting with her daughter which included assertiveness, consistent boundaries, and working on not reinforcing unhealthy dynamics. She noted her daughter blaming her (Lisa Oconnell) for various issues in her life. We explored this during the session and identified that these issues being in full control of her daughter. We worked on processing this. She noted in childhood, and adulthood, often feeling  unloved, ugly, and not as good as the rest. She noted asking her father to work with him, learning to lay bricks, and was told "no, what's not what girls do". She noted being treated very different than her brothers who were allowed to drive and received more attention than she did. She noted her experience with her father stating that she could learn to drive and her mother not allowing it after the fact. We worked on processing childhood experiences and the direct feedback she received from others and how she processed this and how this affected her self-talk. She noted her self-talk centered around feeling "ugly", "unloved", and a "failure". We worked on identifying any possible contributing factors to her self-talk including experiences, and feedback from others. She noted that her parents did not provide her attention and that her mother "changed by birthday to get me in school at 6" and that "that registered as "I want to get rid all of them" (her and brothers). Therapist provided psycho-education regarding self-talk and cognitive distortions during the session and the effect of this on self-esteem and self-image, in addition to the effect on mood and outlook. We worked on processing her experience. We discussed possible core beliefs including feeling invisible, alone, worthless, and failure, which she stated resonated with her. Therapist praised Lisa Oconnell for her effort during the session including vulnerability. Therapist encouraged Lisa Oconnell to work on differentiate between various stressors, to be mindful of of her negative self-talk and challenge negative self-talk. We discussed the importance of being connected  to feelings and the data in any given experience. Therapist provided supportive therapy. A follow-up was scheduled for continued treatment which she benefits from.   Interventions: interpersonal and CBT  Diagnosis:   Generalized anxiety disorder  Depression, recurrent (HCC)  Psychiatric  Treatment: Yes , via PCP. See chart.   Treatment Plan:  [name] is intelligent and motivated for change.   Support System: Dr. Therese Flash, Friends, Kids.   Client Treatment Preferences Outpatient patient.   Client Statement of Needs Lisa Oconnell would like to manage stress and distress, minimize negative self-talk, process past events including those affecting negative self-talk, psych-education regarding anxiety and depression, set and manage boundaries with self and other, identifying areas of control and lack of control, managing anxiety and depression, differentiating between various types of stress, working towards autonomy, building a health support system, engage in consistent self-care, bolster coping skills, improve self-esteem, being more mindful.    Treatment Level Weekly  Symptoms  Depression: loss of interest, feeling down, poor eating, feeling bad about self, trouble concentrating, psycho-motor retardation.  (Status: maintained) Anxiety: feeling anxious, difficulty managing worry, worrying too much about different things trouble relaxing, restlessness, irritability, feeling afraid something awful might happen.    (Status: maintained)  Goals:   Lisa Oconnell experiences symptoms of depression and anxiety.  Treatment plan signed and available on s-drive:  Yes    Target Date: 07/16/24 Frequency: Weekly  Progress: 0 Modality: individual    Therapist will provide referrals for additional resources as appropriate.  Therapist will provide psycho-education regarding Lisa Oconnell's diagnosis and corresponding treatment approaches and interventions. Licensed Clinical Social Worker, [place], LCSW will support the patient's ability to achieve the goals identified. will employ CBT, BA, Problem-solving, Solution Focused, Mindfulness,  coping skills, & other evidenced-based practices will be used to promote progress towards healthy functioning to help manage decrease  symptoms associated with her diagnosis.   Reduce overall level, frequency, and intensity of the feelings of depression, anxiety and panic evidenced by decreased overall symptoms from 6 to 7 days/week to 0 to 1 days/week per client report for at least 3 consecutive months. Verbally express understanding of the relationship between feelings of depression, anxiety and their impact on thinking patterns and behaviors. Verbalize an understanding of the role that distorted thinking plays in creating fears, excessive worry, and ruminations.  (Litha participated in the creation of the treatment plan)   Belva Boyden, LCSW

## 2024-03-17 ENCOUNTER — Ambulatory Visit: Admitting: Psychology

## 2024-03-17 ENCOUNTER — Encounter (HOSPITAL_COMMUNITY): Payer: Self-pay

## 2024-03-24 ENCOUNTER — Ambulatory Visit (INDEPENDENT_AMBULATORY_CARE_PROVIDER_SITE_OTHER): Payer: Medicare HMO | Admitting: Family Medicine

## 2024-03-24 DIAGNOSIS — F411 Generalized anxiety disorder: Secondary | ICD-10-CM

## 2024-03-24 MED ORDER — ALPRAZOLAM 0.5 MG PO TABS: 0.5000 mg | ORAL_TABLET | Freq: Two times a day (BID) | ORAL | 2 refills | Status: DC

## 2024-03-24 NOTE — Assessment & Plan Note (Signed)
 Stable. Continue fluoxetine  20 + 40 mg daily (total daily dose= 60 mg) and alprazolam  0.5 mg 2 tabs daily, with occasional 1 tab of rescue dose.

## 2024-03-24 NOTE — Progress Notes (Signed)
 [facility] [address] [place] [address] Dept: [phone] Dept Fax: [phone]  Chronic Care Office Visit  Subjective:    Patient ID: Lisa Oconnell, female    DOB: 06/06/1941, 83 y.o..   MRN: [id number]  Chief Complaint  Patient presents with   Follow-up    3 month f/u.  C/o having a spot on her nose, and red spot on leg x 1 week.    History of Present Illness:  Patient is in today for reassessment of chronic medical issues.  Ms. Welp has a history of depression and anxiety. Over the past year, much of this has been triggered by marital stress that has eventually ended in separation and nearing divorce. She continues to feel he is doing better overall.  She has supportive friends and family. She is currently on fluoxetine  60 mg daily and uses alprazolam  0.5 mg twice a day. She rarely takes a half tablet for anxiety attacks (once or twice a month). She also continues in counseling every 2 weeks. She is remaining physically active. She continues to resist her ex-husbands attempts to manipulate her.  Past Medical History: Patient Active Problem List   Diagnosis Date Noted   Seborrheic keratoses 05/02/2023   Melanocytic nevi of trunk 05/02/2023   Lipoma of back 05/02/2023   History of dysplastic nevus 05/02/2023   Memory difficulty 05/02/2023   Auditory hallucinations 05/02/2023   Tinnitus aurium, bilateral 11/21/2022   Depression, recurrent (HCC) 02/27/2022   Generalized anxiety disorder 02/27/2022   Insomnia 02/27/2022   History of tuberculosis 02/27/2022   History of colon polyps 02/27/2022   Hyperlipidemia 02/27/2022   Osteopenia 02/27/2022   Lichen simplex chronicus 02/27/2022   Sebopsoriasis 02/26/2022   Sacroiliac joint pain 02/10/2022   Lumbar spondylosis 01/16/2022   HNP (herniated nucleus pulposus), lumbar 02/17/2021   Neurogenic claudication 01/25/2021   Degenerative lumbar spinal stenosis  01/25/2021   History of prosthetic unicompartmental arthroplasty of right knee 04/01/2020   OA (osteoarthritis) of knee 03/28/2017   Acute medial meniscal tear 04/11/2016   Past Surgical History:  Procedure Laterality Date   BREAST EXCISIONAL BIOPSY Right    benign/ 83 years old   BUNIONECTOMY     left   CATARACT EXTRACTION W/ INTRAOCULAR LENS  IMPLANT, BILATERAL  2013   cool sculpting     both arms and back   DILATION AND CURETTAGE OF UTERUS     EYE SURGERY     HAND CONTRACTURE RELEASE     trigger finger   KNEE ARTHROSCOPY WITH MEDIAL MENISECTOMY Right 04/11/2016   Procedure: KNEE ARTHROSCOPY WITH MEDIAL MENISECTOMY AND CONDROPLASTY;  Surgeon: Liliane Rei, MD;  Location: [facility];  Service: Orthopedics;  Laterality: Right;   liposuction (15 yrs ago     stomach   LUMBAR LAMINECTOMY/DECOMPRESSION MICRODISCECTOMY N/A 02/17/2021   Procedure: Microlumbar Decompression Lumbar one-two, Lumbar two-three, Lumbar three-four;  Surgeon: Orvan Blanch, MD;  Location: [facility];  Service: Orthopedics;  Laterality: N/A;   PARTIAL KNEE ARTHROPLASTY Right 03/28/2017   Procedure: UNICOMPARTMENTAL RIGHT  KNEE;  Surgeon: Liliane Rei, MD;  Location: [facility];  Service: Orthopedics;  Laterality: Right;  Adductor Block   TUBAL LIGATION  1978   Family History  Problem Relation Age of Onset   Alzheimer's disease Mother    Heart disease Father    Heart attack Father    Diabetes Sister    Diabetes Brother    Cancer Daughter  Non-Hodgkins Lymphoma   Diabetes Paternal Aunt    Cancer Grandson        Leukemia   Diabetes Grandson        Type 1   Epilepsy Grandson    Breast cancer Neg Hx    Outpatient Medications Prior to Visit  Medication Sig Dispense Refill   acetaminophen  (TYLENOL ) 500 MG tablet Take 1,000 mg by mouth at bedtime.     FLUoxetine  (PROZAC ) 20 MG capsule Take 1 capsule (20 mg total) by mouth daily. Take together with 40 mg capsule (total daily dose of 60 mg daily). 90 capsule 3    FLUoxetine  (PROZAC ) 40 MG capsule Take 1 capsule (40 mg total) by mouth daily. Take together with 20 mg tablet (total daily dose of 60 mg a day) 90 capsule 3   ALPRAZolam  (XANAX ) 0.5 MG tablet Take 1 tablet (0.5 mg total) by mouth 2 (two) times daily. May take additional 0.25 mg (1/2 tablet) if needed for anxiety attack. 62 tablet 2   No facility-administered medications prior to visit.   Allergies  Allergen Reactions   Sulfa Antibiotics Swelling    Mouth blistered,    Ambien  [Zolpidem ] Other (See Comments)   Objective:   Today's Vitals   03/24/24 1012  BP: 122/70  Pulse: 68  Temp: (!) 97.3 F (36.3 C)  TempSrc: Temporal  SpO2: 98%  Weight: 106 lb 3.2 oz (48.2 kg)  Height: 5\' 2"  (1.575 m)   Body mass index is 19.42 kg/m.   General: Well developed, well nourished. No acute distress. Psych: Alert and oriented. Normal mood and affect.  Health Maintenance Due  Topic Date Due   Medicare Annual Wellness (AWV)  02/26/2024     Assessment & Plan:   Problem List Items Addressed This Visit       Other   Generalized anxiety disorder   Stable. Continue fluoxetine  20 + 40 mg daily (total daily dose= 60 mg) and alprazolam  0.5 mg 2 tabs daily, with occasional 1 tab of rescue dose.      Relevant Medications   ALPRAZolam  (XANAX ) 0.5 MG tablet    Return in about 6 months (around 09/24/2024) for Reassessment.   Graig Lawyer, MD

## 2024-04-07 ENCOUNTER — Ambulatory Visit: Admitting: Psychology

## 2024-04-07 DIAGNOSIS — F411 Generalized anxiety disorder: Secondary | ICD-10-CM | POA: Diagnosis not present

## 2024-04-07 DIAGNOSIS — F339 Major depressive disorder, recurrent, unspecified: Secondary | ICD-10-CM

## 2024-04-07 NOTE — Progress Notes (Signed)
 [facility] Counselor/Therapist Progress Note  Patient ID: Lisa Oconnell, MRN: [id number]    Date: 04/07/24  Time Spent: 10:04 am - 10:57 am: 53 Minutes  Treatment Type: Individual Therapy.  Reported Symptoms: depression and anxiety.   Mental Status Exam: Appearance:  Neat and Well Groomed     Behavior: Appropriate  Motor: Normal  Speech/Language:  Clear and Coherent  Affect: Congruent   Mood: normal  Thought process: normal  Thought content:   WNL  Sensory/Perceptual disturbances:   WNL  Orientation: oriented to person, place, time/date, and situation  Attention: Good  Concentration: Good  Memory: WNL  Fund of knowledge:  Good  Insight:   Fair  Judgment:  Good  Impulse Control: Good   Risk Assessment: Danger to Self:  No Self-injurious Behavior: No Danger to Others: No Duty to Warn:no Physical Aggression / Violence:No  Access to Firearms a concern: No  Gang Involvement:No   In case of a mental health emergency:  988 - confidential suicide hotline. Visiting Behavioral Health Urgent Care [facility]):        [address], [place] [address]       [phone] 3.   911  4.   Visiting Nearest ED.    Subjective:   Lisa Oconnell participated in the session, in person in the office with the therapist, and consented to treatment Lisa Oconnell reviewed the events of the past week. She noted her divorced within the next month. She noted engaging with a new lawyer and both her daughters were both upset by this and noted her eldest daughter has not spoken to her since mother's day. She noted her younger daughter is also upset. We worked on exploring this during the session. She noted that mother's day celebration "was a disaster". She noted both her daughters frustration that she is "spending their money". We worked on processing this during the session and highlighted a possible sense of entitlement. We worked on identify boundaries for her daughters and we  worked on Clinical biochemist, assertiveness, and discussed the importance of consistency. We discussed the importance of boundary setting for self. She noted handing this experience more positively than in the past. Therapist validated Lisa Oconnell's feelings and experience. Therapist challenged Lisa Oconnell's jumps to conclusions during the session. Therapist encouraged mindfulness regarding this going forward. Lisa Oconnell was engaged and motivated during the session and expressed commitment towards goals. Therapist praised Lisa Oconnell and provided supportive therapy. A follow-up was scheduled for continued treatment which she benefits from.   Interventions: interpersonal and CBT  Diagnosis:   Generalized anxiety disorder  Depression, recurrent (HCC)  Psychiatric Treatment: Yes , via PCP. See chart.   Treatment Plan:  [name] is intelligent and motivated for change.   Support System: Dr. Therese Flash, Friends, Kids.   Client Treatment Preferences Outpatient patient.   Client Statement of Needs Lisa Oconnell would like to manage stress and distress, minimize negative self-talk, process past events including those affecting negative self-talk, psych-education regarding anxiety and depression, set and manage boundaries with self and other, identifying areas of control and lack of control, managing anxiety and depression, differentiating between various types of stress, working towards autonomy, building a health support system, engage in consistent self-care, bolster coping skills, improve self-esteem, being more mindful.    Treatment Level Weekly  Symptoms  Depression: loss of interest, feeling down, poor eating, feeling bad about self, trouble concentrating, psycho-motor retardation.  (Status: maintained) Anxiety: feeling anxious, difficulty managing worry, worrying too much about  different things trouble relaxing, restlessness, irritability, feeling afraid something awful might happen.     (Status: maintained)  Goals:   Lisa Oconnell experiences symptoms of depression and anxiety.  Treatment plan signed and available on s-drive:  Yes    Target Date: 07/16/24 Frequency: Weekly  Progress: 0 Modality: individual    Therapist will provide referrals for additional resources as appropriate.  Therapist will provide psycho-education regarding Lisa Oconnell's diagnosis and corresponding treatment approaches and interventions. Licensed Clinical Social Worker, [place], LCSW will support the patient's ability to achieve the goals identified. will employ CBT, BA, Problem-solving, Solution Focused, Mindfulness,  coping skills, & other evidenced-based practices will be used to promote progress towards healthy functioning to help manage decrease symptoms associated with her diagnosis.   Reduce overall level, frequency, and intensity of the feelings of depression, anxiety and panic evidenced by decreased overall symptoms from 6 to 7 days/week to 0 to 1 days/week per client report for at least 3 consecutive months. Verbally express understanding of the relationship between feelings of depression, anxiety and their impact on thinking patterns and behaviors. Verbalize an understanding of the role that distorted thinking plays in creating fears, excessive worry, and ruminations.  (Lisa Oconnell participated in the creation of the treatment plan)   Belva Boyden, LCSW

## 2024-04-08 ENCOUNTER — Ambulatory Visit: Admitting: Psychology

## 2024-04-14 DIAGNOSIS — Z961 Presence of intraocular lens: Secondary | ICD-10-CM | POA: Diagnosis not present

## 2024-04-14 DIAGNOSIS — H52223 Regular astigmatism, bilateral: Secondary | ICD-10-CM | POA: Diagnosis not present

## 2024-04-14 DIAGNOSIS — H16223 Keratoconjunctivitis sicca, not specified as Sjogren's, bilateral: Secondary | ICD-10-CM | POA: Diagnosis not present

## 2024-04-21 ENCOUNTER — Ambulatory Visit: Admitting: Psychology

## 2024-04-22 ENCOUNTER — Other Ambulatory Visit: Payer: Self-pay | Admitting: Family Medicine

## 2024-04-22 DIAGNOSIS — F411 Generalized anxiety disorder: Secondary | ICD-10-CM

## 2024-04-22 NOTE — Telephone Encounter (Signed)
 Copied from CRM [id number]. Topic: Clinical - Medication Refill >> Apr 22, 2024 11:49 AM Mesmerise C wrote: Medication:  ALPRAZolam  (XANAX ) 0.5 MG tablet    Has the patient contacted their pharmacy? Yes (Agent: If no, request that the patient contact the pharmacy for the refill. If patient does not wish to contact the pharmacy document the reason why and proceed with request.) (Agent: If yes, when and what did the pharmacy advise?) Lisa Oconnell it was denied form provider This is the patient's preferred pharmacy:  [id number] [place] [facility] [place], [place] - [address] [place] [place]. [place] [address] Phone: [phone] Fax: [phone]  Is this the correct pharmacy for this prescription? Yes If no, delete pharmacy and type the correct one.   Has the prescription been filled recently? No  Is the patient out of the medication? Yes  Has the patient been seen for an appointment in the last year OR does the patient have an upcoming appointment? Yes  Can we respond through MyChart? No  Agent: Please be advised that Rx refills may take up to 3 business days. We ask that you follow-up with your pharmacy.

## 2024-07-21 ENCOUNTER — Other Ambulatory Visit: Payer: Self-pay

## 2024-07-21 DIAGNOSIS — F411 Generalized anxiety disorder: Secondary | ICD-10-CM

## 2024-07-21 MED ORDER — ALPRAZOLAM 0.5 MG PO TABS
0.5000 mg | ORAL_TABLET | Freq: Two times a day (BID) | ORAL | 2 refills | Status: DC
Start: 2024-07-21 — End: 2024-09-24

## 2024-07-21 NOTE — Telephone Encounter (Signed)
 Refill request for   Alprazolam  0.5 mg LR 03/24/24, #62, 2 rf LOV  03/24/24 FOV  09/24/24  Please review and advise.  Thanks. Dm/cma

## 2024-08-01 DIAGNOSIS — L0390 Cellulitis, unspecified: Secondary | ICD-10-CM | POA: Diagnosis not present

## 2024-08-12 ENCOUNTER — Telehealth: Payer: Self-pay

## 2024-08-12 NOTE — Telephone Encounter (Signed)
 Noted on RX that it is a 31 day supply.  Gave that information to pharmacist.  Dm/cma

## 2024-08-12 NOTE — Telephone Encounter (Signed)
 Copied from CRM #[id number]. Topic: General - Other >> Aug 12, 2024 10:18 AM Rosina BIRCH wrote: Reason for CRM: Julianna from [facility] called stating she need additional information on the ALPRAZolam  medication. She need to know the intended date supply-how long one fill should last the patient [phone]

## 2024-09-24 ENCOUNTER — Encounter: Payer: Self-pay | Admitting: Family Medicine

## 2024-09-24 ENCOUNTER — Ambulatory Visit: Admitting: Family Medicine

## 2024-09-24 VITALS — BP 110/66 | HR 52 | Temp 97.5°F | Ht 62.0 in | Wt 106.4 lb

## 2024-09-24 DIAGNOSIS — F411 Generalized anxiety disorder: Secondary | ICD-10-CM

## 2024-09-24 DIAGNOSIS — F339 Major depressive disorder, recurrent, unspecified: Secondary | ICD-10-CM | POA: Diagnosis not present

## 2024-09-24 DIAGNOSIS — L821 Other seborrheic keratosis: Secondary | ICD-10-CM | POA: Diagnosis not present

## 2024-09-24 MED ORDER — ALPRAZOLAM 0.5 MG PO TABS: 0.7500 mg | ORAL_TABLET | Freq: Every day | ORAL | 2 refills | Status: DC

## 2024-09-24 NOTE — Assessment & Plan Note (Addendum)
 Stable. Continue fluoxetine  20 + 40 mg daily (total daily dose= 60 mg) and alprazolam  1.5 mg at bedtime. We discussed continuing to gradually reduce the dose of her alprazolam  with a plan to eventually stop this.

## 2024-09-24 NOTE — Assessment & Plan Note (Signed)
 Skin lesions is consistent with an SK. This is benign. We will manage expectantly.

## 2024-09-24 NOTE — Progress Notes (Signed)
 [facility] [address] [place] [address] Dept: [phone] Dept Fax: [phone]  Chronic Care Office Visit  Subjective:    Patient ID: Lisa Oconnell, female    DOB: 08-22-1941, 83 y.o..   MRN: [id number]  Chief Complaint  Patient presents with   Anxiety    6 month f/u Anxiety/meds.  C/o having a few spots on face.  Also fell 2 weeks ago and hit the side of her face.   Declines flu shot.    History of Present Illness:  Patient is in today for reassessment of chronic medical conditions.   Lisa Oconnell has a history of depression and anxiety. Over the past 2 years, much of this was triggered by marital stress that eventually ended in separation and divorce. She is currently managed on fluoxetine  60 mg daily. She has reduced her alprazolam  use to 0.5 mg 1 1/2 tabs at bedtime. She found the morning dose made her sluggish during the day.  She is remaining physically active by walking her dog daily. She has stopped counseling. She has two close girlfriends who she is getting together with regularly and who help to keep tabs on how she is doing.   Lisa Oconnell has a small rough area on her right cheek. She feels this has gotten smaller with time.  Past Medical History: Patient Active Problem List   Diagnosis Date Noted   Seborrheic keratoses 05/02/2023   Melanocytic nevi of trunk 05/02/2023   Lipoma of back 05/02/2023   History of dysplastic nevus 05/02/2023   Memory difficulty 05/02/2023   Auditory hallucinations 05/02/2023   Tinnitus aurium, bilateral 11/21/2022   Depression, recurrent 02/27/2022   Generalized anxiety disorder 02/27/2022   Insomnia 02/27/2022   History of tuberculosis 02/27/2022   History of colon polyps 02/27/2022   Hyperlipidemia 02/27/2022   Osteopenia 02/27/2022   Lichen simplex chronicus 02/27/2022   Sebopsoriasis 02/26/2022   Sacroiliac joint pain 02/10/2022   Lumbar spondylosis 01/16/2022   HNP  (herniated nucleus pulposus), lumbar 02/17/2021   Neurogenic claudication 01/25/2021   Degenerative lumbar spinal stenosis 01/25/2021   History of prosthetic unicompartmental arthroplasty of right knee 04/01/2020   OA (osteoarthritis) of knee 03/28/2017   Acute medial meniscal tear 04/11/2016   Past Surgical History:  Procedure Laterality Date   BREAST EXCISIONAL BIOPSY Right    benign/ 83 years old   BUNIONECTOMY     left   CATARACT EXTRACTION W/ INTRAOCULAR LENS  IMPLANT, BILATERAL  2013   cool sculpting     both arms and back   DILATION AND CURETTAGE OF UTERUS     EYE SURGERY     HAND CONTRACTURE RELEASE     trigger finger   KNEE ARTHROSCOPY WITH MEDIAL MENISECTOMY Right 04/11/2016   Procedure: KNEE ARTHROSCOPY WITH MEDIAL MENISECTOMY AND CONDROPLASTY;  Surgeon: Lerner Moan, MD;  Location: [facility];  Service: Orthopedics;  Laterality: Right;   liposuction (15 yrs ago     stomach   LUMBAR LAMINECTOMY/DECOMPRESSION MICRODISCECTOMY N/A 02/17/2021   Procedure: Microlumbar Decompression Lumbar one-two, Lumbar two-three, Lumbar three-four;  Surgeon: Duwayne Purchase, MD;  Location: [facility];  Service: Orthopedics;  Laterality: N/A;   PARTIAL KNEE ARTHROPLASTY Right 03/28/2017   Procedure: UNICOMPARTMENTAL RIGHT  KNEE;  Surgeon: Moan Lerner, MD;  Location: [facility];  Service: Orthopedics;  Laterality: Right;  Adductor Block   TUBAL LIGATION  1978   Family History  Problem Relation Age of Onset   Alzheimer's disease Mother  Heart disease Father    Heart attack Father    Diabetes Sister    Diabetes Brother    Cancer Daughter        Non-Hodgkins Lymphoma   Diabetes Paternal Aunt    Cancer Grandson        Leukemia   Diabetes Grandson        Type 1   Epilepsy Grandson    Breast cancer Neg Hx    Outpatient Medications Prior to Visit  Medication Sig Dispense Refill   acetaminophen  (TYLENOL ) 500 MG tablet Take 1,000 mg by mouth at bedtime.     ALPRAZolam  (XANAX ) 0.5 MG tablet Take 1  tablet (0.5 mg total) by mouth 2 (two) times daily. May take additional 0.25 mg (1/2 tablet) if needed for anxiety attack. 62 tablet 2   FLUoxetine  (PROZAC ) 20 MG capsule Take 1 capsule (20 mg total) by mouth daily. Take together with 40 mg capsule (total daily dose of 60 mg daily). 90 capsule 3   FLUoxetine  (PROZAC ) 40 MG capsule Take 1 capsule (40 mg total) by mouth daily. Take together with 20 mg tablet (total daily dose of 60 mg a day) 90 capsule 3   No facility-administered medications prior to visit.   Allergies  Allergen Reactions   Sulfa Antibiotics Swelling    Mouth blistered,    Ambien  [Zolpidem ] Other (See Comments)     Objective:   Today's Vitals   09/24/24 0954  BP: 110/66  Pulse: (!) 52  Temp: (!) 97.5 F (36.4 C)  TempSrc: Temporal  SpO2: 96%  Weight: 106 lb 6.4 oz (48.3 kg)  Height: 5' 2 (1.575 m)   Body mass index is 19.46 kg/m.   General: Well developed, well nourished. No acute distress. Skin: < 1 cm area of roughened skin on right cheek. Psych: Alert and oriented. Normal mood and affect.  Health Maintenance Due  Topic Date Due   Medicare Annual Wellness (AWV)  02/26/2024   Influenza Vaccine  Never done   COVID-19 Vaccine (5 - 2025-26 season) 07/07/2024       09/24/2024   10:50 AM 03/24/2024   10:22 AM 12/18/2023   10:06 AM  PHQ9 SCORE ONLY  PHQ-9 Total Score 7 14  10       Data saved with a previous flowsheet row definition    Assessment & Plan:   Problem List Items Addressed This Visit       Musculoskeletal and Integument   Seborrheic keratoses   Skin lesions is consistent with an SK. This is benign. We will manage expectantly.        Other   Depression, recurrent   Lisa Oconnell's mood is much improved. Continue fluoxetine  60 mg daily.      Relevant Medications   ALPRAZolam  (XANAX ) 0.5 MG tablet   Generalized anxiety disorder - Primary   Stable. Continue fluoxetine  20 + 40 mg daily (total daily dose= 60 mg) and alprazolam  1.5 mg at  bedtime. We discussed continuing to gradually reduce the dose of her alprazolam  with a plan to eventually stop this.      Relevant Medications   ALPRAZolam  (XANAX ) 0.5 MG tablet    Return in about 6 months (around 03/24/2025) for Annual preventative care.   Garnette CHRISTELLA Simpler, MD  I,Emily Lagle,acting as a scribe for Garnette CHRISTELLA Simpler, MD.,have documented all relevant documentation on the behalf of Garnette CHRISTELLA Simpler, MD.  I, Garnette CHRISTELLA Simpler, MD, have reviewed all documentation for this visit. The documentation on  09/24/2024 for the exam, diagnosis, procedures, and orders are all accurate and complete.

## 2024-09-24 NOTE — Assessment & Plan Note (Signed)
 Ms. Mcguffee mood is much improved. Continue fluoxetine  60 mg daily.

## 2024-10-22 ENCOUNTER — Other Ambulatory Visit: Payer: Self-pay | Admitting: Family Medicine

## 2024-10-22 DIAGNOSIS — F339 Major depressive disorder, recurrent, unspecified: Secondary | ICD-10-CM

## 2024-12-02 ENCOUNTER — Telehealth: Payer: Self-pay

## 2024-12-02 ENCOUNTER — Other Ambulatory Visit: Payer: Self-pay | Admitting: Family Medicine

## 2024-12-02 DIAGNOSIS — F411 Generalized anxiety disorder: Secondary | ICD-10-CM

## 2024-12-02 MED ORDER — ALPRAZOLAM 0.5 MG PO TABS: 0.7500 mg | ORAL_TABLET | Freq: Every day | ORAL | 2 refills | Status: AC

## 2024-12-02 NOTE — Progress Notes (Signed)
 Received a request to refill Lisa Oconnell's alprazolam . Requested by her friend, Lisa Oconnell, who was in for an appointment today. Lisa Oconnell is unable to get out of her home due to the recent snow/sleet.

## 2024-12-02 NOTE — Telephone Encounter (Signed)
 Refill request received for  ALPRAZolam  (XANAX ) 0.5 MG tablet   FOV:03/26/2025 LOV: 09/24/2024 Last refill:09/24/2024 but was given with 2 extra refills Medication is being requested too soon a refill should still be on file.

## 2024-12-02 NOTE — Telephone Encounter (Signed)
 Copied from CRM #[id number]. Topic: Clinical - Medication Refill >> Dec 02, 2024 11:42 AM Burnard DEL wrote: Medication: ALPRAZolam  (XANAX ) 0.5 MG tablet  Has the patient contacted their pharmacy? No (Agent: If no, request that the patient contact the pharmacy for the refill. If patient does not wish to contact the pharmacy document the reason why and proceed with request.) (Agent: If yes, when and what did the pharmacy advise?)  This is the patient's preferred pharmacy:  [id number] [place] [facility] [place], [place] - [address] [place] AT [address] [place]. [place] [address] Phone: [phone] Fax: [phone]  Is this the correct pharmacy for this prescription? Yes If no, delete pharmacy and type the correct one.   Has the prescription been filled recently? No  Is the patient out of the medication? Yes  Has the patient been seen for an appointment in the last year OR does the patient have an upcoming appointment? Yes  Can we respond through MyChart? No  Agent: Please be advised that Rx refills may take up to 3 business days. We ask that you follow-up with your pharmacy.

## 2025-03-26 ENCOUNTER — Encounter: Admitting: Family Medicine
